# Patient Record
Sex: Male | Born: 1950 | Race: White | Hispanic: No | Marital: Married | Smoking: Never smoker
Health system: Southern US, Community
[De-identification: ages and names within clinical notes are randomized; demographics above are authoritative.]

## PROBLEM LIST (undated history)

## (undated) DIAGNOSIS — I77810 Thoracic aortic ectasia: Secondary | ICD-10-CM

## (undated) DIAGNOSIS — Z8669 Personal history of other diseases of the nervous system and sense organs: Secondary | ICD-10-CM

## (undated) DIAGNOSIS — I2510 Atherosclerotic heart disease of native coronary artery without angina pectoris: Secondary | ICD-10-CM

## (undated) DIAGNOSIS — N400 Enlarged prostate without lower urinary tract symptoms: Secondary | ICD-10-CM

## (undated) DIAGNOSIS — Z973 Presence of spectacles and contact lenses: Secondary | ICD-10-CM

## (undated) DIAGNOSIS — R7303 Prediabetes: Secondary | ICD-10-CM

## (undated) DIAGNOSIS — Z972 Presence of dental prosthetic device (complete) (partial): Secondary | ICD-10-CM

## (undated) DIAGNOSIS — I6523 Occlusion and stenosis of bilateral carotid arteries: Secondary | ICD-10-CM

## (undated) DIAGNOSIS — E782 Mixed hyperlipidemia: Secondary | ICD-10-CM

## (undated) DIAGNOSIS — R519 Headache, unspecified: Secondary | ICD-10-CM

## (undated) DIAGNOSIS — M170 Bilateral primary osteoarthritis of knee: Secondary | ICD-10-CM

## (undated) DIAGNOSIS — M7918 Myalgia, other site: Secondary | ICD-10-CM

## (undated) DIAGNOSIS — N210 Calculus in bladder: Secondary | ICD-10-CM

## (undated) DIAGNOSIS — Z8546 Personal history of malignant neoplasm of prostate: Secondary | ICD-10-CM

## (undated) DIAGNOSIS — C801 Malignant (primary) neoplasm, unspecified: Secondary | ICD-10-CM

## (undated) DIAGNOSIS — Z87442 Personal history of urinary calculi: Secondary | ICD-10-CM

## (undated) DIAGNOSIS — R011 Cardiac murmur, unspecified: Secondary | ICD-10-CM

## (undated) DIAGNOSIS — K219 Gastro-esophageal reflux disease without esophagitis: Secondary | ICD-10-CM

## (undated) DIAGNOSIS — I82409 Acute embolism and thrombosis of unspecified deep veins of unspecified lower extremity: Secondary | ICD-10-CM

## (undated) HISTORY — PX: FINGER SURGERY: SHX640

## (undated) HISTORY — DX: Benign prostatic hyperplasia without lower urinary tract symptoms: N40.0

## (undated) HISTORY — PX: OTHER SURGICAL HISTORY: SHX169

## (undated) HISTORY — PX: BLADDER STONE REMOVAL: SHX568

## (undated) HISTORY — PX: VASECTOMY: SHX75

## (undated) HISTORY — PX: CYSTOSCOPY WITH INSERTION OF UROLIFT: SHX6678

## (undated) HISTORY — PX: TONSILLECTOMY: SUR1361

## (undated) HISTORY — PX: TESTICLE REMOVAL: SHX68

## (undated) HISTORY — DX: Bilateral primary osteoarthritis of knee: M17.0

## (undated) HISTORY — PX: ORCHIECTOMY: SHX2116

---

## 1973-12-19 DIAGNOSIS — Z86718 Personal history of other venous thrombosis and embolism: Secondary | ICD-10-CM

## 1973-12-19 HISTORY — PX: OTHER SURGICAL HISTORY: SHX169

## 1973-12-19 HISTORY — PX: KNEE ARTHROSCOPY: SUR90

## 1973-12-19 HISTORY — DX: Personal history of other venous thrombosis and embolism: Z86.718

## 2008-02-25 ENCOUNTER — Encounter (INDEPENDENT_AMBULATORY_CARE_PROVIDER_SITE_OTHER): Admitting: Neurology

## 2009-04-16 ENCOUNTER — Ambulatory Visit (INDEPENDENT_AMBULATORY_CARE_PROVIDER_SITE_OTHER): Admitting: Neurology

## 2009-04-16 VITALS — BP 128/73 | HR 71 | Temp 97.1°F | Resp 18 | Ht 69.0 in | Wt 147.0 lb

## 2009-04-16 NOTE — Progress Notes (Signed)
 See dictated note.

## 2009-04-25 NOTE — Progress Notes (Signed)
 CLINIC: [facility]    REPORT TYPE: Consultation    Dictating Practitioner: Gordy Levan, M.D.        DATE OF SERVICE: 04/16/2009    REASON FOR VISIT: Initial Eval    Ronald Becker is a lovely 58 year old male referred by Dr. Oliver Becker in the  [facility] for neurologic evaluation.    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 He is easily distracted and has poor impulse control. Ronald Becker  has been with his caregiver since 40. There are chronic problems with  "getting into other people's business". In addition, the patient has  difficulty with attention and focusing. For some time, there has been some  increased activity during the night. The patient awakens, makes noise, and  sometimes talks to either himself or other perceived persons. Most  recently, the patient has had these episodes during the day. His  caregiver describes it as "almost as if there is another person inside  him". There are no short-term memory difficulties. There are no real  functional changes. The patient is quite adept at asking for his  medications and ride in the morning. 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 The patient is able to  feed, dress, and toilet himself. Ronald Becker's clinical picture is complicated  by a seizure disorder for which he takes phenobarbital. He had one seizure  approximately 8 years ago.    There are no headaches or double vision. His caregiver, Ronald Becker, wonders if  there may be a visual agnosia. There are no changes in hearing or  tinnitus. There is no dysphagia. There are no bowel or bladder problems.  The patient denies any depression. He does have some anxiety and fatigue.  Review of systems is otherwise negative.     Past Medical History: There is no diabetes, hypertension, or cardiac  disease. There is the previously mentioned seizure disorder.  There is a  right rotator cuff injury. There are no allergies to medications. The  patient does not smoke cigarettes or drink alcohol. His surgeries include  right shoulder surgery only. Current medications include phenobarbital 60  mg tid.     Social History: The patient was educated at [employer]. He went to special education. He lived at [facility] for 12 years. Currently, he is with Marigene Ehlers in a level II  home.     Family History: The patient's father had Alzheimer disease.     Physical Examination: Ronald Becker is a well-nourished male in no apparent  distress. Weight is 147 lbs. Blood pressure is 128/73 and pulse is 71.  Respirations are normal at 18.     Mental Status: The patient is alert with fluent speech. I hear no  paraphasic errors. On the mini mental state examination, the patient  scores 22/30, losing points for orientation, short-term verbal recall,  repetition, writing, following a three-stage command, and copying the  intersecting pentagons.     Cranial Nerves: Pupils are equal round and reactive to light.  Extraocular muscles are intact. Visual fields are full to confrontation.  Facial sensation and symmetry are normal. Hearing is intact to rustle  bilaterally. Tongue and uvula are midline.     Motor: There are pronated feet with fallen arches bilaterally.  Hammertoes, right greater than left.  Sensory: Pinprick and vibration appear to be intact.     Reflexes: These are symmetric throughout..     Cerebellar: Finger-nose-finger is performed well. There is some  slowing of finger-finger on the right. Rapid alternating movements are  performed well. There is slowing of toe tap bilaterally.     Gait: The patient is unable to walk on his toes. He has difficulty  walking on his heels. He is otherwise steady.     Impression:     1. Developmentally disabled.   2. Moderate mental retardation.   3. Seizure disorder, on phenobarbital.   4. Right rotator cuff injury.   5.  Anxiety.   6. Fatigue.   7. Spells of unclear etiology.  Ronald Becker's caregiver, Ronald Becker, appears to be most concerned about the spells  that are happening at night and now during the day. She does not feel that  the patient has declined from a previous higher level of functioning. She  does not think that there are short-term memory difficulties. I therefore  suggested that we start with an MRI of the brain and also an EEG in light  of the patient's history of seizure disorder. Ronald Becker will return to clinic  in 2 months for reevaluation.          Ronald Glazier, MD, PhD  Professor of Neurosciences    cc:          Electronically signed by:  Gordy Levan, M.D. 04/25/2009 07:06 P          DD: 04/16/2009 DT: 04/24/2009 07:42 A DocNo.: [id number]  JPC/sus    Referring Physician:  Oliver Becker M.D.  [address]  [place] [address]    Primary Care Physician:  Ronald Becker M.D.  [address]  [facility]  [place], [place] [address]    cc::

## 2009-04-29 ENCOUNTER — Ambulatory Visit (HOSPITAL_BASED_OUTPATIENT_CLINIC_OR_DEPARTMENT_OTHER)

## 2009-07-02 ENCOUNTER — Ambulatory Visit (INDEPENDENT_AMBULATORY_CARE_PROVIDER_SITE_OTHER): Admitting: Neurology

## 2009-07-02 VITALS — BP 110/74 | HR 64 | Temp 98.0°F

## 2009-07-02 MED ORDER — PHENOBARBITAL PO: 5.00 mg | ORAL | Status: AC

## 2009-07-06 NOTE — Progress Notes (Signed)
 See dictated note.

## 2009-07-10 NOTE — Progress Notes (Signed)
CLINIC: [facility]    REPORT TYPE: Consultation    Dictating Practitioner: Gordy Levan, M.D.        DATE OF SERVICE: 07/02/2009    REASON FOR VISIT: Follow up    Ronald Becker is a lovely 58 year old male with developmental delay and moderate  mental retardation who returns for follow up. The patient's clinical  picture is complicated by a seizure disorder for which he takes  phenobarbital. At first visit, on April 16, 2009, the patient's caregiver,  Ronald Becker, felt that Ronald Becker did not really have any short-term memory  difficulties. We did suggest an MRI of the brain and also an EEG in light  of the patient's history of seizure disorder. Done on 04/29/2009, the  patient's MRI of the brain showed multiple scattered foci of T2  prolongation within the frontal and parietal white matter, nonspecific,  possibly associated with chronic small vessel ischemic disease. The  patient's EEG of the brain done on 04/29/2009 was normal. There were no  focal or epileptiform abnormalities observed. Overall, Ronald Becker's mood has  improved significantly. He is bowling during the week. Functionally, he  is able to bathe, dress, and toilet without difficulty. He is able to feed  himself. There are no complaints of headaches, double vision, changes in  hearing or tinnitus, or dysphagia. There are no bowel or bladder problems.  Review of systems is otherwise negative.     Past medical history, social history, and family history are  otherwise unchanged.     On physical examination, the patient is a well-developed,  well-nourished male in no apparent distress. Vital signs are stable. The  patient appears well. On the mini mental state examination, the patient  scores 17/30 which is actually down 5 points from his last visit. Cranial  nerves are normal. There are pronated feet with fallen arches bilaterally.  There are also hammertoes, right greater than left. Reflexes are  symmetric. The patient is not able to walk on his toes. He has  difficulty  walking on his heels. Gait is otherwise stable.     Impression:     1. Developmentally disabled.   2. Moderate mental retardation.   3. Seizure disorder, on phenobarbital, normal recent EEG.   4. Right rotator cuff injury.   5. Anxiety.   6. Fatigue.     I suggested to Ronald Becker's caregiver, Ronald Becker, that she continue to  observe Ronald Becker for evidence of a decline in his functioning. She denies  any short-term memory problems. She will, however, ask at the patient's  day program with regards to this. Ronald Becker will return to clinic in 4 months  for reevaluation.          Milus Glazier, MD, PhD  Professor of Neurosciences      cc:            Electronically signed by:  Gordy Levan, M.D. 07/10/2009 08:54 A          DD: 07/02/2009 DT: 07/09/2009 07:42 A DocNo.: [id number]  JPC/sus    Referring Physician:  Oliver Barre M.D.  [address]  [place] [address]    Primary Care Physician:  Oliver Barre M.D.  [address]  [facility]  [place], [place] [address]    cc::

## 2009-11-05 ENCOUNTER — Encounter (INDEPENDENT_AMBULATORY_CARE_PROVIDER_SITE_OTHER): Payer: Self-pay | Admitting: Neurology

## 2009-11-05 ENCOUNTER — Encounter (INDEPENDENT_AMBULATORY_CARE_PROVIDER_SITE_OTHER): Admitting: Neurology

## 2010-09-29 NOTE — Procedures (Signed)
 REPORT TYPE: Outpatient Standard EEG    EEG# [id number]    [facility] NAME: Ronald Becker    Dictating Physician: Rosie Fate. Otto Herb, M.D.     Staff Physician: Reyes Ivan. Marnette Burgess, M.D.    Requesting Provider: Gordy Levan, M.D.    Study Date: 04/29/2009    START TIME: 0910 STOP TIME: 1045 EQUIPMENT TYPE: XLTEK    Indication for Referral: Seizure disorder    Clinical History/Diagnosis: Moderate cognitive difficulty, seizures    Medications: Ativan, phenobarbital    Sedation: NA Medication: NA Dosage: NA    Time Since Last Seizure: 10 years ago    Amount of Sleep in Last 24 Hours: 8 hours    Last Meal: 0630    COMMENT: This standard EEG is performed on a 59 year old man with  cognitive complaints, seizure disorder and episodes of altered mental  status each night. He is currently receiving phenobarbital and Ativan.    REPORT: During quiet wakefulness 8-9 Hz potentials are observed with  generalized and symmetric distribution, which are maximal in amplitude in  the posterior head regions. They are attenuated by eye opening. Medium  amplitude beta activity is observed in the anterior and central head  regions. Drowsiness is associated with attenuation of the posterior  dominant rhythm, the appearance of central theta potentials and slow,  rolling eye movements. Stage II of sleep is characterized by symmetric  vertex waves, 14-16 Hz sleep spindles, and K-complexes that occur on a  background of generalized, symmetrically distributed delta and theta  potentials. Deeper stages of sleep are not achieved. Hyperventilation  elicits no significant changes in the record. Photic stimulation elicits a  modest, symmetric driving response at all flash frequencies.    INTERPRETATION: This EEG, performed during wakefulness and stages I and II  of sleep, is within normal limits. No focal or epileptiform abnormalities  are observed.    Ronald Becker, M.D., Neurology Resident    I have examined the above tracing and have reviewed and edited  the above  report. I concur with the findings as stated above.    Cristie Hem, M.D., Ph.D./Professor of Neurosciences            Reviewed & Electronically Signed by:  Rosie Fate. Otto Herb, M.D. 05/04/2009 03:15 P    Electronically signed by:  Reyes Ivan. Marnette Burgess, M.D. 05/25/2009 12:18 P    DD: 04/29/2009 DT: 04/30/2009 10:24 A DocNo.: [id number]  RMM/wmg      cc:

## 2010-12-19 HISTORY — PX: CYSTOSCOPY WITH INSERTION OF UROLIFT: SHX6678

## 2011-12-20 DIAGNOSIS — C61 Malignant neoplasm of prostate: Secondary | ICD-10-CM

## 2011-12-20 HISTORY — DX: Malignant neoplasm of prostate: C61

## 2014-10-23 ENCOUNTER — Emergency Department
Admission: EM | Admit: 2014-10-23 | Discharge: 2014-10-24 | Disposition: A | Discharge status: Home / Self Care | Payer: Medicare Other | Attending: Emergency Medicine | Admitting: Emergency Medicine

## 2014-10-23 DIAGNOSIS — M25512 Pain in left shoulder: Secondary | ICD-10-CM

## 2014-10-23 DIAGNOSIS — S43025A Posterior dislocation of left humerus, initial encounter: Principal | ICD-10-CM | POA: Insufficient documentation

## 2014-10-23 DIAGNOSIS — F88 Other disorders of psychological development: Secondary | ICD-10-CM | POA: Insufficient documentation

## 2014-10-23 DIAGNOSIS — S43026D Posterior dislocation of unspecified humerus, subsequent encounter: Secondary | ICD-10-CM

## 2014-10-23 DIAGNOSIS — R569 Unspecified convulsions: Secondary | ICD-10-CM | POA: Insufficient documentation

## 2014-10-23 DIAGNOSIS — Y929 Unspecified place or not applicable: Secondary | ICD-10-CM | POA: Insufficient documentation

## 2014-10-23 DIAGNOSIS — Z87820 Personal history of traumatic brain injury: Secondary | ICD-10-CM | POA: Insufficient documentation

## 2014-10-23 DIAGNOSIS — S43022A Posterior subluxation of left humerus, initial encounter: Secondary | ICD-10-CM

## 2014-10-23 DIAGNOSIS — W010XXA Fall on same level from slipping, tripping and stumbling without subsequent striking against object, initial encounter: Secondary | ICD-10-CM | POA: Insufficient documentation

## 2014-10-23 MED ORDER — METHOHEXITAL SODIUM 0.5 GM IJ SOLR
1.5000 mg/kg | INTRAMUSCULAR | Status: AC
Start: 2014-10-23 — End: 2014-10-23
  Filled 2014-10-23: qty 500

## 2014-10-23 MED ORDER — METHOHEXITAL SODIUM 0.5 GM IJ SOLR
INTRAMUSCULAR | Status: AC | PRN
Start: 2014-10-23 — End: 2014-10-23
  Administered 2014-10-23: 70 mg via INTRAVENOUS

## 2014-10-23 MED ORDER — ACETAMINOPHEN 325 MG PO TABS
975.0000 mg | ORAL_TABLET | Freq: Once | ORAL | Status: AC
Start: 2014-10-23 — End: 2014-10-23
  Administered 2014-10-23: 975 mg via ORAL
  Filled 2014-10-23: qty 3

## 2014-10-23 NOTE — ED Procedure Note (Signed)
Procedure Note  Orthopedic Injury Treatment  Date/Time: 10/23/2014 10:54 PM  Performed by: Laney PastorGRIEME, Danielly Ackerley ARTHUR  Authorized by: Kathlynn GrateBARBER, CAROLYN RAGUET  Consent: Verbal consent obtained. Written consent obtained.  Risks and benefits: risks, benefits and alternatives were discussed  Consent given by: power of attorney  Injury location: shoulder  Location details: left shoulder  Injury type: dislocation  Dislocation type: posterior  Hill-Sachs deformity: yes  Chronicity: new  Pre-procedure neurovascular assessment: neurovascularly intact  Pre-procedure distal perfusion: normal  Pre-procedure neurological function: normal  Pre-procedure range of motion: reduced  Manipulation performed: yes  Reduction method: traction and counter traction  Immobilization: splint  Post-procedure neurovascular assessment: post-procedure neurovascularly intact  Post-procedure distal perfusion: normal  Post-procedure neurological function: normal  Post-procedure range of motion: normal  Patient tolerance: Patient tolerated the procedure well with no immediate complications

## 2014-10-23 NOTE — Consults (Signed)
ORTHOPAEDIC SURGERY CONSULTATION    10/23/2014   9:17 PM  Requesting physician: Romona CurlsBarber, Carolyn Raguet, *    History:  ID:  Ronald Becker   63 year old male  [id number]    CC: left shoulder pain    DOI: 10/17/14    HPI:   63 year old male who sustained a left shoulder injury last Friday. Patient is developmentally delayed so difficulty with answering questions and unsure of his past medical history. Initially [facility] where he was told he had a shoulder [place] and was placed in a sling. Pain didn't improve so his caregiver took him to [facility] ED today.    Mechanism of injury: stepped off a curb he wasn't expecting, tripped, and fell directly on left shoulder.    Immediate symptoms: immediate pain, immediate swelling. Symptoms have been unchanged since that time.     Currently denies numbness/tingling or weakness, F/C/N/V or concurrent injuries.    Baseline activity level is RHD. No antecedent pain. Denies prior dislocations.    Lives at home with his caregiver. Additional history obtained from caregiver and sister.    PMH:   Developmental delay  Seizures    Meds:   Phenobarbital  Dilantin    PSH:   Unsure    All: Review of patient's allergies indicates no known allergies.    FH: noncontributory    SH:    Denies smoking tobacco, denies drinking, denies illicit drug use  Lives at home with his caregiver.  Prior level of function: RHD, no antecedent left shoulder pain.    ROS:   Unremarkable.   Denies F/C/N/V/D/CP/SOB.    Physical Exam:     Temperature:  [97.9 F (36.6 C)] 97.9 F (36.6 C) (11/05 1638)  Blood pressure (BP): (137)/(85) 137/85 mmHg (11/05 1638)  Heart Rate:  [82] 82 (11/05 1638)  Respirations:  [16] 16 (11/05 1638)  Pain Score:  [-] 9 (11/05 2000)  O2 Device:  [-]   SpO2:  [95 %] 95 % (11/05 1638)  Wt Readings from Last 1 Encounters:   10/23/14 68.04 kg (150 lb)       General:   A&A, no distress  NLB  RRR    Musculoskeletal:  LUE:     No lacerations, no ecchymosis, no edema   +TTP  shoulder, no TTP arm, elbow, forearm, wrist, hand     + pain with ROM of shoulder, no pain with ROM elbow, wrist, hand     Palpable radial pulse, cap refill < 2 seconds, WWP     SILT R/M/U/Ax nerve distributions   Ain/pin/ulnar intact   4/5 biceps/triceps/we/wf/fg/io    Radiographs:  Left shoulder: posterior dislocation with reverse Hill-Sachs lesion    Assessment/Plan:  63 year old male s/p mechanical fall found to have left posterior shoulder dislocation. Mechanism not usually consistent with posterior dislocation, possible prior seizure given patient history and poor source of information given developmental delay though he insists he did not seize recently.    1. Closed reduction under sedation performed with adequate reduction on post-reduction XR. Patient noted to have instability with re-dislocation on 30 degrees of internal rotation with catching on reverse Hill-Sachs lesion.  2. Ultra sling immobilization with no active adduction or internal rotation.  3. F/U ortho hand clinic next Wednesday with Dr. Sabino SnipesMeunier    The above plan was discussed with chief on call, Dr. Nanda QuintonMisaghi

## 2014-10-23 NOTE — ED Notes (Signed)
Pt medicated with Brevital 70mg  SIVP. Pt's eyes was asleep when the injection was completed.  Dr. Adelfa KohGrieme then began to manually reduce the pt's rt shoulder. Pt shoulder was reduced after 1 maneuver. Shoulder immobilizer was replaced.  Will continue 1:1 monitoring until pt returns to his baseline.

## 2014-10-23 NOTE — ED Provider Notes (Signed)
Pt seen with dr Adelfa KohGrieme.  Pt is a 63yom sp mechanical fall 1 week ago without LOC with co L shoulder pain. [facility] thought dislocated but then not and placed in sling. Pt presents with persistent L shoulder pain. Denies CHI/neck /back/cp or other complaints. -f/n/vda.    No past medical history on file.   CVA, sz  No current facility-administered medications on file prior to encounter.     Current Outpatient Prescriptions on File Prior to Encounter   Medication Sig Dispense Refill    PHENOBARBITAL PO 5 mg. 1 tablet three times daily     No Known Allergies  Sh no smk/eoth/ivd  Fh neg  ROS:  Denies HA, weakness, fatigue, dizziness, lightheadedness  Denies sore throat, rhinorrhea, difficulty swallowing, otalgia  Denies fevers, night sweats, chills  Denies neck pain, neck stiffness, photophobia  Denies SOB, wheezing, cough, DOE, orthopnea, PND  Denies palpitations, chest pain,  Denies abdominal pain, vomiting, diarrhea,   Denies dysuria, hematuria, scrotal pain/swelling or vag d/c  Denies rash, LE swelling, pruritis + L shoulder pain  Denies focal weakness, numbness, tingling.   Denies difficulty with vision, swallowing.  Denies difficulty with ambulation or balance.    4    10/23/14  1638   BP: 137/85   Pulse: 82   Temp: 97.9 F (36.6 C)   Resp: 16   SpO2: 95%     PE:  Pt well appearing in NAD, resting comfortably  HEENT NCAT PERRLA EOMI O/P: clear, no redness  Neck: supple, NT, no adenopathy, no stiffness, -thyromegaly noted  Lungs: CTA B -tachypnea/ wheeze/ rales noted  CV: RRR nls1s2 -MRG  Abd; soft NT ND nl BS -HSM -mass -guarding/rebound -flank tenderness  Extr: -C/C/E -rash L shoulder +tenderness L humeral head, min swell, no redness, nvi nl axillary sens no elbow/wrist/hand tenderness. 2=raidal pulse  Back: -TLS tenderness -flank pain  Neuro: A&Ox3 CNS 2-12 intact Motor: 5/5 B delt/bic/tric/infrasp/wr ext/flex/f/ext/io  5/5 B ilipsoas/quads/hamstr/h.adduc/h.abduc/gastr/tib  ant/dorsif/plantarflesion  Sens intact throughout DTRS: 2+thorughout bic/tric/brach/patell/ankles nl gait  IMP: pt is a 63yo sp mechanical fall 1 week ago without head trauma with L posterior shoulder dislocation. Have consulted ortho and plan cons sedation/reduction        Kathlynn GrateBarber, Refoel Palladino Raguet, MD  10/23/14 2052

## 2014-10-23 NOTE — ED Notes (Signed)
Assumed pt care, cc left shoulder pain 8/10 comfort measures provided. Pt care giver at bedside. Pt reports mechanical fall on Friday c injury to left shoulder, pt went to [facility] where dx c shoulder sprain, pt caregiver reports inability to see PCP for FU as instructed so came here for eval. Pt awake/alert, speaking in full/clear sentences, a/ox4, maex4 = strength, +cms, PERRLA. No extra heart sounds, breath sounds clear, rr even/non labored. Bowel sounds present, abd soft/non tender. Skin intact. amb c steady gait. Pt denies cp, sob, abd pain, n/v/d/c, f/c. Bed low and locked, side rails up x2. Safety and comfort measures provided.

## 2014-10-23 NOTE — ED MD Progress Note (Signed)
Sign-out received from Dr. 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 Fall one week ago, seen at [facility], placed in shoulder immobilizer, here has posterior shoulder dislocation. Neurovascularly intact, pain with movement. Attempted sedation reduction, appears to be out again on follow-up Xray. Orthopedics has evaluated, plan to discuss with them.

## 2014-10-23 NOTE — ED MD Progress Note (Signed)
Conscious sedation performed under my direct supervision with brevital. Shoulder easily reduced by dr Lenoard AdenGreime with  Inline traction. Post reduction xray pend. nvi pre and post.

## 2014-10-23 NOTE — ED MD Progress Note (Signed)
Post shoulder dislocated, if successful can likely go.  Ortho seegin aslo

## 2014-10-23 NOTE — ED Notes (Signed)
Pt has returned to his pre sedation physiological status. 1:1 monitoring ended.

## 2014-10-23 NOTE — ED Procedure Note (Signed)
Procedure Note  Sedation  Date/Time: 10/23/2014 10:56 PM  Performed by: Laney PastorGRIEME, Aerielle Stoklosa ARTHUR  Authorized by: Kathlynn GrateBARBER, CAROLYN RAGUET  Consent: Verbal consent obtained. Written consent obtained.  Risks and benefits: risks, benefits and alternatives were discussed  Consent given by: power of attorney  Patient identity confirmed: verbally with patient  Patient sedated: yes  Sedation type: moderate (conscious) sedation  Sedatives: methohexital  Sedation end date/time: 10/23/2014 10:56 PM  Vitals: Vital signs were monitored during sedation.  Patient tolerance: Patient tolerated the procedure well with no immediate complications

## 2014-10-23 NOTE — ED Notes (Signed)
Bed: T02  Expected date:   Expected time:   Means of arrival:   Comments:  For triage

## 2014-10-23 NOTE — ED Notes (Signed)
Drs. Benna DunksBarber and Adelfa KohGrieme to the bedside to manually reduce pt's rt shoulder.  Pt calm, skins warm, dry and intact  Sinus rhythm on the monitor. Crash Cart at the bedside, intubation box opened, ET tube open in case there are any airway compromise.

## 2014-10-23 NOTE — ED Notes (Signed)
PRN note  2100: pt prepped for conscious sedation for left shoulder relocation  Pt on monitor w/ ausible alarms on  Pt pt on ETCO2=33  Airway box, crash cart, o2, suction, ambu bag at bedside  Dr Paediatric nursebarber, dr Adelfa Kohgrieme and ortho MD(Parvaresh) present for sedation

## 2014-10-24 MED ORDER — METHOHEXITAL SODIUM 0.5 GM IJ SOLR
INTRAMUSCULAR | Status: DC | PRN
Start: 2014-10-24 — End: 2014-10-24
  Administered 2014-10-24 (×2): 20 mg via INTRAVENOUS

## 2014-10-24 MED ORDER — METHOHEXITAL SODIUM 0.5 GM IJ SOLR
1.0000 mg/kg | Freq: Once | INTRAMUSCULAR | Status: AC
Start: 2014-10-24 — End: 2014-10-24
  Administered 2014-10-24: 70 mg via INTRAVENOUS
  Filled 2014-10-24: qty 500

## 2014-10-24 MED ORDER — HYDROCODONE-ACETAMINOPHEN 5-325 MG OR TABS
1.0000 | ORAL_TABLET | Freq: Four times a day (QID) | ORAL | Status: AC | PRN
Start: 2014-10-24 — End: ?

## 2014-10-24 NOTE — Discharge Instructions (Signed)
Take the Norco as prescribed for severe shoulder pain. Follow up with the Orthopedic Trauma Clinic within 1-2 weeks for a follow-up. Stay in the specialized sling at all times to prevent further dislocation. If your symptoms worsen, you develop new symptoms such as fever, or if you have any other concerns please return promptly to the Emergency Department.       Dislocation, Shoulder    You have been seen for a dislocation of your shoulder.    The dislocation has been reduced (repaired). The bones are now in their normal position.    A dislocation is when one of the 2 bones that make up a joint gets out of place. It is no longer in the right place to let the joint bend. Sometimes a ligament or set of ligaments is injured during the dislocation. Shoulder dislocations can also injure one of the nerves which give the arm and hand sensation (feeling). There may also be a fracture (broken bone) with a dislocation.    After the bones are relocated (moved), general care involves medicine to reduce pain. It also includes a splint/cast to reduce movement. It also includes Rest, Ice, Compression and Elevation of the injured area. Remember this as "RICE."   REST: Limit the use of the injured body part.   ICE: By applying ice to the affected area, swelling and pain can be reduced. Place some ice cubes in a re-sealable (Ziploc) bag and add some water. Put a thin washcloth between the bag and the skin. Apply the ice bag to the area for at least 20 minutes. Do this at least 4 times per day. It is okay to do this more often than directed. You can also do it for longer than directed. NEVER APPLY ICE DIRECTLY TO THE SKIN.   COMPRESS: Compression means to apply pressure around the injured area such as with a splint, cast or an ACE bandage. Compression decreases swelling and improves comfort. Compression should be tight enough to relieve swelling but not so tight as to decrease circulation. Increasing pain, numbness, tingling,  or change in skin color, are all signs of decreased circulation.   ELEVATE: Elevate the injured part. For example, an arm can be elevated with a sling while standing and sitting. It can be elevated by propping it up on pillows while lying down.    You have been given a sling and swath for your injury. This will help keep the injured part elevated. It will also improve comfort.    YOU SHOULD SEEK MEDICAL ATTENTION IMMEDIATELY, EITHER HERE OR AT THE NEAREST EMERGENCY DEPARTMENT, IF ANY OF THE FOLLOWING OCCURS:   The affected area gets swollen or much more painful.   New numbness and tingling in or below the affected area.   You develop a cold hand that seems to have blood supply problems.

## 2014-10-24 NOTE — ED Notes (Signed)
Portable digital xrays of the rt shoulder completed.

## 2014-10-24 NOTE — Interdisciplinary (Signed)
Please read the narrative in EPIC.

## 2014-10-24 NOTE — Interdisciplinary (Signed)
Dr. Sabino GasserPaveresh fit patient with ultrasling for dislocated shoulder in ER.

## 2014-10-24 NOTE — ED Notes (Signed)
Shoulder immobilizer, from the Ortho Clinic, applied to the right shoulder by the Ortho Resident.

## 2014-10-24 NOTE — ED Notes (Signed)
[facility] cell phone [phone]

## 2014-10-24 NOTE — ED Notes (Signed)
Dr. Quitman LivingsKurz and the Orthopedic Resident at the bedside  0404: Dr. Quitman LivingsKurz administered Brevital 70mg  SIVP over 1 minute.  Atticus.[id number] Orthopedic Resident attempting to manually reduce pt's right shoulder.

## 2014-10-24 NOTE — ED Notes (Signed)
Pt resting on his gurney with a shoulder immobilizer in place. Awaiting Ortho re evaluation.

## 2014-10-24 NOTE — ED Notes (Signed)
Brevital 20mg  SIVP.

## 2014-10-24 NOTE — ED Notes (Signed)
Pt's Caregiver here to take pt home. Caregiver verbalized understanding of all instructions. Ambulated with a steady gait from the tx area.

## 2014-10-24 NOTE — ED MD Progress Note (Signed)
Sign out note  63 yo w post shoulder dislocation.  S/p reduciton attempt x 1 failed  Ortho to see and attempt reduction

## 2014-10-28 NOTE — ED Follow-up Note (Signed)
Follow-up type: Callback       Routine ED Patient Call Back    Patient unable to be contacted, message left

## 2014-10-30 ENCOUNTER — Ambulatory Visit: Payer: Medicare Other | Attending: Emergency Medicine | Admitting: Hand Surgery

## 2014-10-30 ENCOUNTER — Ambulatory Visit (HOSPITAL_BASED_OUTPATIENT_CLINIC_OR_DEPARTMENT_OTHER)
Admission: RE | Admit: 2014-10-30 | Discharge: 2014-10-30 | Disposition: A | Discharge status: Home / Self Care | Payer: Medicare Other

## 2014-10-30 VITALS — BP 118/78 | HR 70 | Temp 98.1°F

## 2014-10-30 DIAGNOSIS — M25512 Pain in left shoulder: Principal | ICD-10-CM | POA: Insufficient documentation

## 2014-10-30 DIAGNOSIS — S43005A Unspecified dislocation of left shoulder joint, initial encounter: Secondary | ICD-10-CM | POA: Insufficient documentation

## 2014-10-30 MED ORDER — LOVASTATIN PO: ORAL | Status: AC

## 2014-10-30 NOTE — Progress Notes (Signed)
Department of Orthopaedic Surgery  Division of Hand and Microvascular Surgery    HISTORY OF PRESENT ILLNESS  Ronald EatonDaniel Breene Becker is a 63 year old M with medical history of developmental delay requiring long term care and previous seizure activity who presents for evaluation of a recent L shoulder posterior dislocation.  He has a history of fall and RIGHT shoulder dislocation complicated by RTC tear s/p RTC repair.  On 10/30 the pt fell and sustained a posterior shoulder dislocation that was reduced in the ED.  It was noted to be somewhat unstable and a reverse hill-sachs was noted on plain films.  He was placed in a shoulder immobilizer and follows up today.  Of note he and his caretaker do not recall any previous dislocation or recent grand-mal type seizure activity.      PAST MEDICAL HISTORY  Moderate mental retardation  Stroke  seizure    PAST SURGICAL HISTORY  RTC repair R shoulder     MEDICATIONS    HYDROcodone-acetaminophen (NORCO) 5-325 MG tablet Take 1 tablet by mouth every 6 hours as needed for Severe Pain (Pain Score 7-10).   LOVASTATIN PO    PHENOBARBITAL PO 5 mg. 1 tablet three times daily       ALLERGIES  No Known Allergies    SOCIAL HISTORY  Lives in a sheltered program, here today with his long term care taker.  No etoh, cigs, illicits.     FAMILY HISTORY  No family history on file.     REVIEW OF SYSTEMS  A comprehensive review of systems questionnaire completed by the patient is negative except for cataracts.    PHYSICAL EXAMINATION  VITALS: BP 118/78 mmHg   Pulse 70   Temp(Src) 98.1 F (36.7 C) (Oral), There is no weight on file to calculate BMI., Data Unavailable/10  GENERAL: A+Ox3.  Well appearing and well groomed.   MENTAL STATUS: Pleasant and cooperative.    UPPER EXTREMITY:   R shoulder:  Sling in place  Passive gentle ROM performed: ER to 10, IR to belly and FF/Abd to ~20 without significant pain or mechanical symptoms  Fires AIN/PIN/U/A  SILT R/U/M/A  2+ radial pulse    IMAGING  STUDIES  Right shoulder:   No definite reverse osseous Bankart lesion or large reverse Hill-Sachs deformity, but there is slight flattening along the anteromedial   aspect of the humeral head.  There is mild osteoarthrosis of the acromioclavicular joint.  Bones appear demineralized.    IMPRESSION AND PLAN  Ronald Becker is a 63 year old who presents for follow up after a recent left posterior GH dislocation with a reverse hill-sachs.  Per report this is the first such dislocation he has sustained on the L.  He does have a history of seizure activity with possible posterior laxity as a posterior dislocation is not common.  Given his mental capacities and inability to maintain shoulder precautions will continue sling wear at this time with f/u in 2 weeks for repeat imaging and exam with expected weaning of his sling at that time.  Plan and findings d/w Dr. Jackalyn LombardHentzen

## 2014-10-30 NOTE — Progress Notes (Signed)
See resident’s note for details.  I saw and evaluated the patient.  I have discussed the patient with the resident and agree with resident’s findings and the plan we developed as written.

## 2014-11-06 ENCOUNTER — Telehealth (HOSPITAL_BASED_OUTPATIENT_CLINIC_OR_DEPARTMENT_OTHER): Payer: Self-pay | Admitting: Hand Surgery

## 2014-11-06 NOTE — Telephone Encounter (Signed)
Patient caregiver walks in today requesting a possible letter stating he can go back to his program.  She will call back today to see if letter has been done. Message was sent to Dr. Jackalyn LombardHentzen.

## 2014-11-06 NOTE — Telephone Encounter (Signed)
Please find out what kind of program they are referring to and what type of activity that would entail.

## 2014-11-07 ENCOUNTER — Telehealth (HOSPITAL_BASED_OUTPATIENT_CLINIC_OR_DEPARTMENT_OTHER): Payer: Self-pay | Admitting: Hand Surgery

## 2014-11-07 NOTE — Telephone Encounter (Signed)
Pt's care giver requesting a letter for pt to go back to work and continue with program shelter work shop. He will like to start at soon as possible, to please mail letter to his home address.

## 2014-11-07 NOTE — Telephone Encounter (Signed)
Pt is returning Nizta's call re: additional information for pt's return to work letter.    Krystal Eatonaniel Breene Silvio  requested a letter of disability status on 11/07/2014.    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

## 2014-11-07 NOTE — Telephone Encounter (Signed)
Called patient caregiver for further clarification for letter. Should be calling back with type of activities.

## 2014-11-07 NOTE — Telephone Encounter (Signed)
Letter for program done and faxed as requested.

## 2014-11-18 ENCOUNTER — Encounter (HOSPITAL_BASED_OUTPATIENT_CLINIC_OR_DEPARTMENT_OTHER): Payer: Self-pay | Admitting: Hand Surgery

## 2014-11-18 DIAGNOSIS — M25512 Pain in left shoulder: Principal | ICD-10-CM

## 2014-11-20 ENCOUNTER — Ambulatory Visit
Admission: RE | Admit: 2014-11-20 | Discharge: 2014-11-20 | Disposition: A | Discharge status: Home / Self Care | Payer: Medicare Other | Source: Ambulatory Visit | Attending: Diagnostic Radiology | Admitting: Diagnostic Radiology

## 2014-11-20 ENCOUNTER — Encounter (HOSPITAL_BASED_OUTPATIENT_CLINIC_OR_DEPARTMENT_OTHER): Payer: Self-pay | Admitting: Hand Surgery

## 2014-11-20 ENCOUNTER — Ambulatory Visit: Payer: Medicare Other | Attending: Hand Surgery | Admitting: Hand Surgery

## 2014-11-20 VITALS — BP 146/90 | HR 65 | Temp 98.9°F | Ht 68.0 in | Wt 150.0 lb

## 2014-11-20 DIAGNOSIS — M19012 Primary osteoarthritis, left shoulder: Principal | ICD-10-CM | POA: Insufficient documentation

## 2014-11-20 DIAGNOSIS — S43005D Unspecified dislocation of left shoulder joint, subsequent encounter: Principal | ICD-10-CM

## 2014-11-20 NOTE — Progress Notes (Signed)
Department of Orthopaedic Surgery  Division of Hand and Microvascular Surgery    HISTORY OF PRESENT ILLNESS  From his last visit on 10/30/14:  "Ronald Becker is a 63 year old M with medical history of developmental delay requiring long term care and previous seizure activity who presents for evaluation of a recent L shoulder posterior dislocation.  He has a history of fall and RIGHT shoulder dislocation complicated by RTC tear s/p RTC repair.  On 10/30 the pt fell and sustained a posterior shoulder dislocation that was reduced in the ED.  It was noted to be somewhat unstable and a reverse hill-sachs was noted on plain films.  He was placed in a shoulder immobilizer and follows up today.  Of note he and his caretaker do not recall any previous dislocation or recent grand-mal type seizure activity."    Since his last visit he has remained in his brace aside from bathing but per the report of his caregiver has also been using the LUE in the brace.  No complaints today, no pain. Denies numbness/tingling.      PAST MEDICAL HISTORY  Moderate mental retardation  Stroke  seizure    PAST SURGICAL HISTORY  RTC repair R shoulder     MEDICATIONS    HYDROcodone-acetaminophen (NORCO) 5-325 MG tablet Take 1 tablet by mouth every 6 hours as needed for Severe Pain (Pain Score 7-10).   LOVASTATIN PO    PHENOBARBITAL PO 5 mg. 1 tablet three times daily       ALLERGIES  No Known Allergies    SOCIAL HISTORY  Lives in a sheltered program, here today with his long term care taker.  No etoh, cigs, illicits.     FAMILY HISTORY  No family history on file.     REVIEW OF SYSTEMS  A comprehensive review of systems questionnaire completed by the patient is negative except for cataracts.    PHYSICAL EXAMINATION  VITALS: BP 146/90 mmHg   Pulse 65   Temp(Src) 98.9 F (37.2 C) (Oral)   Ht 5\' 8"  (1.727 m)   Wt 68.04 kg (150 lb)   BMI 22.81 kg/m2, Body mass index is 22.81 kg/(m^2)., Data Unavailable/10  GENERAL: A+Ox3.  Well appearing and  well groomed.   MENTAL STATUS: Pleasant and cooperative.    UPPER EXTREMITY:   R shoulder:  Sling in place  Passive gentle ROM performed: ER to 20, IR to belly and FF/Abd to ~90* without significant pain or mechanical symptoms  Fires AIN/PIN/U/A  SILT R/U/M/A  2+ radial pulse    IMAGING STUDIES  Right shoulder:   Congruent GH joint.    No definite reverse osseous Bankart lesion or large reverse Hill-Sachs deformity, but there is slight flattening along the anteromedial   aspect of the humeral head.    IMPRESSION AND PLAN  Ronald EatonDaniel Breene Becker is a 63 year old who presents for follow up 1 month after a recent left posterior GH dislocation with a reverse hill-sachs on imaging, doing well.  Per report this is the first such dislocation he has sustained on the L.  Will discontinue sling use today, pt educated on limiting abduction/IR position.  Should he have further complaints or dislocation events they can return to clinic for evaluation and further treatment options.  Plan and findings d/w Dr. Parks Rangerak.

## 2019-09-17 ENCOUNTER — Encounter: Payer: Self-pay | Admitting: Orthopaedic Surgery

## 2019-09-17 ENCOUNTER — Ambulatory Visit: Payer: Self-pay

## 2019-09-17 ENCOUNTER — Other Ambulatory Visit: Payer: Self-pay

## 2019-09-17 ENCOUNTER — Ambulatory Visit (INDEPENDENT_AMBULATORY_CARE_PROVIDER_SITE_OTHER): Payer: Medicare Other | Admitting: Orthopaedic Surgery

## 2019-09-17 VITALS — BP 136/78 | HR 40 | Ht 70.0 in | Wt 200.0 lb

## 2019-09-17 DIAGNOSIS — M170 Bilateral primary osteoarthritis of knee: Secondary | ICD-10-CM | POA: Diagnosis not present

## 2019-09-17 DIAGNOSIS — G8929 Other chronic pain: Secondary | ICD-10-CM | POA: Diagnosis not present

## 2019-09-17 DIAGNOSIS — M25561 Pain in right knee: Secondary | ICD-10-CM

## 2019-09-17 DIAGNOSIS — M25562 Pain in left knee: Secondary | ICD-10-CM | POA: Diagnosis not present

## 2019-09-17 MED ORDER — LIDOCAINE HCL 1 % IJ SOLN
2.0000 mL | INTRAMUSCULAR | Status: AC | PRN
  Administered 2019-09-17: 10:00:00 2 mL

## 2019-09-17 MED ORDER — BUPIVACAINE HCL 0.5 % IJ SOLN
2.0000 mL | INTRAMUSCULAR | Status: AC | PRN
  Administered 2019-09-17: 2 mL via INTRA_ARTICULAR

## 2019-09-17 MED ORDER — METHYLPREDNISOLONE ACETATE 40 MG/ML IJ SUSP
80.0000 mg | INTRAMUSCULAR | Status: AC | PRN
  Administered 2019-09-17: 10:00:00 80 mg via INTRA_ARTICULAR

## 2019-09-17 MED ORDER — LIDOCAINE HCL 1 % IJ SOLN
2.0000 mL | INTRAMUSCULAR | Status: AC | PRN
  Administered 2019-09-17: 2 mL

## 2019-09-17 MED ORDER — BUPIVACAINE HCL 0.5 % IJ SOLN
2.0000 mL | INTRAMUSCULAR | Status: AC | PRN
  Administered 2019-09-17: 10:00:00 2 mL via INTRA_ARTICULAR

## 2019-09-17 MED ORDER — METHYLPREDNISOLONE ACETATE 40 MG/ML IJ SUSP
80.0000 mg | INTRAMUSCULAR | Status: AC | PRN
  Administered 2019-09-17: 80 mg via INTRA_ARTICULAR

## 2019-09-17 NOTE — Addendum Note (Signed)
Addended by: Lendon Collar on: 09/17/2019 10:28 AM   Modules accepted: Orders

## 2019-09-17 NOTE — Progress Notes (Signed)
Office Visit Note   Patient: Tristan Hamilton           Date of Birth: 19-Jan-1951           MRN: [id number] Visit Date: 09/17/2019              Requested by: No referring provider defined for this encounter. PCP: No primary care provider on file.   Assessment & Plan: Visit Diagnoses:  1. Chronic pain of both knees   2. Bilateral primary osteoarthritis of knee     Plan: Advanced bilateral knee osteoarthritis.  I had a long discussion with [name] regarding his knees.  He recently moved from [place] to the [place] area in the summer.  He had been receiving cortisone injections and even Visco supplementation from his orthopedist in [place].  I was concerned about multiple cortisone injections and discussed knee replacement particularly on the left that is more symptomatic.  It is been over 6 months since his last injection so I will inject both of his knees today and obtain an MRI scan of his left knee to determine the extent of his arthritis and whether he might be a candidate for either a unilateral or total knee replacement.  Office visit over 45 minutes 50% of the time in counseling  Follow-Up Instructions: Return After MRI left knee.   Orders:  Orders Placed This Encounter  Procedures  . Large Joint Inj: bilateral knee  . XR KNEE 3 VIEW LEFT  . XR KNEE 3 VIEW RIGHT   No orders of the defined types were placed in this encounter.     Procedures: Large Joint Inj: bilateral knee on 09/17/2019 9:56 AM Indications: diagnostic evaluation Details: 25 G 1.5 in needle, anteromedial approach  Arthrogram: No  Medications (Right): 2 mL lidocaine 1 %; 2 mL bupivacaine 0.5 %; 80 mg methylPREDNISolone acetate 40 MG/ML Medications (Left): 2 mL lidocaine 1 %; 2 mL bupivacaine 0.5 %; 80 mg methylPREDNISolone acetate 40 MG/ML Outcome: tolerated well, no immediate complications Consent was given by the patient. Immediately prior to procedure a time out was called to verify the  correct patient, procedure, equipment, support staff and site/side marked as required. Patient was prepped and draped in the usual sterile fashion.       Clinical Data: No additional findings.   Subjective: Chief Complaint  Patient presents with  . Right Knee - Pain  . Left Knee - Pain  Patient presents today for bilateral knee pain X years. No known injury. His left knee seems to being hurting more than the right. He has pain that moves around in his knees depending on what he is doing. He has swelling in his left knee. He takes Aleve for pain. He gets cortisone injections in his knees every three months, and his last injections were in March. He has tried visco injections in the past, but those did not help.  [name] recently moved to the [place] area from [place].  He has been treated for "years" for arthritis of both of his knees by an orthopedist who had been injecting cortisone and even Visco supplementation.  He has had some mild compromise of his activities and has been hesitant to consider knee replacement because he was not that "uncomfortable".  He has tried over-the-counter medicines.  HPI  Review of Systems  Constitutional: Negative for fatigue.  HENT: Negative for ear pain.   Eyes: Negative for pain.  Respiratory: Negative for shortness of breath.  Cardiovascular: Negative for leg swelling.  Gastrointestinal: Negative for constipation and diarrhea.  Endocrine: Negative for cold intolerance and heat intolerance.  Genitourinary: Negative for difficulty urinating.  Musculoskeletal: Positive for joint swelling.  Skin: Negative for rash.  Allergic/Immunologic: Negative for food allergies.  Neurological: Negative for weakness.  Hematological: Does not bruise/bleed easily.  Psychiatric/Behavioral: Negative for sleep disturbance.     Objective: Vital Signs: BP 136/78   Pulse (!) 40   Ht 5\' 10"  (1.778 m)   Wt 200 lb (90.7 kg)   BMI 28.70 kg/m    Physical Exam Constitutional:      Appearance: He is well-developed.  Eyes:     Pupils: Pupils are equal, round, and reactive to light.  Pulmonary:     Effort: Pulmonary effort is normal.  Skin:    General: Skin is warm and dry.  Neurological:     Mental Status: He is alert and oriented to person, place, and time.  Psychiatric:        Behavior: Behavior normal.     Ortho Exam both knees have increased varus more so on left than the right.  No effusion.  Predominately medial joint pain bilaterally.  Lacked a few degrees to full extension on the left and full extension on the right.  No instability.  Flexed over 110 degrees bilaterally.  No popliteal pain.  No calf pain.  +1 pulses.  Neurologically intact.  Straight leg raise negative.  Painless range of motion both hips  Specialty Comments:  No specialty comments available.  Imaging: Xr Knee 3 View Left  Result Date: 09/17/2019 Films of the left knee were obtained in 3 projections standing.  There is about 6 degrees of varus.  There is bone-on-bone in the medial compartment with peripheral osteophytes and subchondral sclerosis particularly about the tibia.  There are some osteophytes in the lateral compartment and at the patellofemoral joint as well.  I think the patella tracks in the midline.  Films are consistent with moderate osteoarthritis predominately in the medial compartment  Xr Knee 3 View Right  Result Date: 09/17/2019 Films of the right knee were obtained in 3 projections standing.  There is about 4 degrees of varus and considerable narrowing of the medial knee compartment with peripheral osteophytes and subchondral sclerosis.  The changes are less than the left knee but similar in that there are degenerative changes in all 3 compartments.  No ectopic calcification or acute changes.  Films are consistent with moderate osteoarthritis    PMFS History: Patient Active Problem List   Diagnosis Date Noted  . Bilateral primary  osteoarthritis of knee 09/17/2019   Past Medical History:  Diagnosis Date  . Enlarged prostate     History reviewed. No pertinent family history.  Past Surgical History:  Procedure Laterality Date  . CYSTOSCOPY WITH INSERTION OF UROLIFT     Social History   Occupational History  . Not on file  Tobacco Use  . Smoking status: Never Smoker  . Smokeless tobacco: Never Used  Substance and Sexual Activity  . Alcohol use: Never    Frequency: Never  . Drug use: Never  . Sexual activity: Not on file

## 2019-10-06 ENCOUNTER — Ambulatory Visit
Admission: RE | Admit: 2019-10-06 | Discharge: 2019-10-06 | Disposition: A | Discharge status: Home / Self Care | Payer: Medicare Other | Source: Ambulatory Visit | Attending: Orthopaedic Surgery | Admitting: Orthopaedic Surgery

## 2019-10-06 ENCOUNTER — Other Ambulatory Visit: Payer: Self-pay

## 2019-10-06 DIAGNOSIS — M170 Bilateral primary osteoarthritis of knee: Secondary | ICD-10-CM

## 2019-10-06 DIAGNOSIS — G8929 Other chronic pain: Secondary | ICD-10-CM

## 2019-10-16 ENCOUNTER — Other Ambulatory Visit: Payer: Self-pay | Admitting: Orthopaedic Surgery

## 2019-10-16 ENCOUNTER — Ambulatory Visit: Payer: Medicare Other | Admitting: Orthopaedic Surgery

## 2019-10-16 DIAGNOSIS — G8929 Other chronic pain: Secondary | ICD-10-CM

## 2019-10-16 DIAGNOSIS — M25561 Pain in right knee: Secondary | ICD-10-CM

## 2019-10-21 ENCOUNTER — Ambulatory Visit (INDEPENDENT_AMBULATORY_CARE_PROVIDER_SITE_OTHER): Payer: Medicare Other | Admitting: Orthopaedic Surgery

## 2019-10-21 ENCOUNTER — Other Ambulatory Visit: Payer: Self-pay

## 2019-10-21 ENCOUNTER — Encounter: Payer: Self-pay | Admitting: Orthopaedic Surgery

## 2019-10-21 DIAGNOSIS — M1712 Unilateral primary osteoarthritis, left knee: Secondary | ICD-10-CM | POA: Diagnosis not present

## 2019-10-21 DIAGNOSIS — M1711 Unilateral primary osteoarthritis, right knee: Secondary | ICD-10-CM | POA: Insufficient documentation

## 2019-10-21 NOTE — Progress Notes (Signed)
Office Visit Note   Patient: Tristan Hamilton           Date of Birth: 02/02/1951           MRN: [id number] Visit Date: 10/21/2019              Requested by: Garald Balding, MD [address] [place],  [place] [address] PCP: [name], MD   Assessment & Plan: Visit Diagnoses:  1. Unilateral primary osteoarthritis, left knee   2. Unilateral primary osteoarthritis, right knee     Plan: Given the severity of his arthritis he is not a candidate for partial knee replacement surgery.  Both knees have slight flexion contractures and varus malalignment but more importantly have severe arthritis involving all 3 compartments.  I showed him a knee model explained in detail what is going on with his knees.  We talked about operative and nonoperative treatment modalities.  He has tried and failed all forms of nonoperative treatment modalities and would like to proceed with knee replacement surgery.  He would like to having these both done at once.  I do feel this is reasonable given that he is thin, has severe arthritis in both knees, is not a diabetic and not a smoker.  However, he understands that there is significant heightened risk of acute blood loss anemia as well as nerve vessel injury and most importantly DVT.  I would have him on stronger blood thinning medication after surgery as well.  I showed him a knee replacement model.  We talked about his interoperative and postoperative course.  He would like to consider having surgery sometime after the first of the year.  All question concerns were answered addressed.  Work on having the schedule.  Follow-Up Instructions: Return for 2 weeks post-op.   Orders:  No orders of the defined types were placed in this encounter.  No orders of the defined types were placed in this encounter.     Procedures: No procedures performed   Clinical Data: No additional findings.   Subjective: Chief Complaint  Patient presents with  . Left  Knee - Pain  The patient is someone I am seeing for the first time.  He has known and well-documented tricompartmental arthritis of both knees.  He is a thin individual who is very active.  He is not a diabetic.  He has been dealing with knee pain for many years now.  He has been a long-term patient of orthopedic surgery up in [place] where he was from.  He has had steroid injections and hyaluronic acid injections in his knees.  He is also seen Dr. Durward Fortes down here.  He is someone that would like to consider having both knees replaced at the same time given the disease in both his knees.  He has talked to a therapist who has had this done as well before and he is highly motivated.  He is had conservative treatment for many years now.  The left knee hurts worse in the right knee.  He wanted to discuss whether or not he is a candidate for partial knee replacement surgery.  He does report difficulty getting down on both his knees.  He reports stiffness and daily pain.  At this point his knee arthritis is detriment affecting his mobility, his quality of life, and his actives daily living.  HPI  Review of Systems He currently denies any headache, chest pain, shortness of breath, fever, chills, nausea, vomiting  Objective:  Vital Signs: There were no vitals taken for this visit.  Physical Exam He is alert and orient x3 and in no acute distress Ortho Exam Examination of both knees shows significant varus malalignment.  Both knees have mild to moderate effusions.  Both knees have full range of motion and are ligamentously stable.  Both knees I can correct the varus malalignment slightly with stressing the knee. Specialty Comments:  No specialty comments available.  Imaging: No results found. Independent review of x-rays of both knees actually shows severe tricompartment arthritis involving all 3 compartments.  There is significant varus malalignment of both knees.  Both knees have bone-on-bone  wear on the medial aspect.  However there is marginal osteophytes of the lateral compartment of both knees and severe patellofemoral arthritic changes of both knees.  PMFS History: Patient Active Problem List   Diagnosis Date Noted  . Unilateral primary osteoarthritis, left knee 10/21/2019  . Unilateral primary osteoarthritis, right knee 10/21/2019  . Bilateral primary osteoarthritis of knee 09/17/2019   Past Medical History:  Diagnosis Date  . Enlarged prostate     History reviewed. No pertinent family history.  Past Surgical History:  Procedure Laterality Date  . CYSTOSCOPY WITH INSERTION OF UROLIFT     Social History   Occupational History  . Not on file  Tobacco Use  . Smoking status: Never Smoker  . Smokeless tobacco: Never Used  Substance and Sexual Activity  . Alcohol use: Never    Frequency: Never  . Drug use: Never  . Sexual activity: Not on file

## 2019-11-01 ENCOUNTER — Ambulatory Visit (INDEPENDENT_AMBULATORY_CARE_PROVIDER_SITE_OTHER): Payer: Medicare Other | Admitting: Family Medicine

## 2019-11-01 ENCOUNTER — Encounter: Payer: Self-pay | Admitting: Family Medicine

## 2019-11-01 ENCOUNTER — Other Ambulatory Visit: Payer: Self-pay

## 2019-11-01 DIAGNOSIS — C61 Malignant neoplasm of prostate: Secondary | ICD-10-CM

## 2019-11-01 DIAGNOSIS — M170 Bilateral primary osteoarthritis of knee: Secondary | ICD-10-CM | POA: Diagnosis not present

## 2019-11-01 DIAGNOSIS — E559 Vitamin D deficiency, unspecified: Secondary | ICD-10-CM | POA: Insufficient documentation

## 2019-11-01 DIAGNOSIS — E538 Deficiency of other specified B group vitamins: Secondary | ICD-10-CM

## 2019-11-01 DIAGNOSIS — N400 Enlarged prostate without lower urinary tract symptoms: Secondary | ICD-10-CM

## 2019-11-01 NOTE — Progress Notes (Signed)
Virtual Visit via Telephone Note  I connected with Tristan Hamilton 11/01/19 at  1:00 PM EST   by telephone and verified that I am speaking with the correct person using two identifiers.  We attempted to a virtual visit but he cannot get his camera and audio to work.   I discussed the limitations, risks, security and privacy concerns of performing an evaluation and management service by telephone and the availability of in person appointments. I also discussed with the patient that there may be a patient responsible charge related to this service. The patient expressed understanding and agreed to proceed.  Location patient: home Location provider: work office Participants present for the call: patient, provider Patient did not have a visit in the prior 7 days to address this/these issue(s).   Tristan Hamilton DOB: 07-19-1951 Encounter date: 11/01/2019  This isa 68 y.o. male who presents to establish care. Chief Complaint  Patient presents with  . Establish Care    History of present illness: He was supposed to be a new patient visit in the office today, but complained of sore throat so we were able to complete the visit via telephone.  He moved here this summer from [place].  Has been following with ortho for arthritis in knees - Dr. Durward Fortes, Dr. Ninfa Linden. Both knees are bone on bone. Has been putting off surgery for a long time. Getting to point of needing aleve every day even with cortisone shots. Has scheduled mid Jan to get both knees replaced at same time.   Was diagnosed with prostate cancer in march. They were monitoring with PSA. Hasn't seen a specialist here for this. Did have a couple of biopsies. Also had urolift due to enlarged prostate, was able to get off of medications after this.   Just woke up with a sore throat this morning. Feels fine otherwise. No fevers or other sick symptoms. No sick exposures.   Had [facility] visit. They brought up question:  ?pneuomonia vaccine? He had a shingles vaccine, but not sure what one. Didn't get flu shot this year.   Wife wondering about vitamin d level.  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 Started taking anti-histamine after aura. Then another friend recommended B12 and migraines essentially went away.   Used to have terrible leg cramps and seemed like they improved with vitamin D supplementation.   No issues with cholesterol and blood pressure in the past.   Just started seeing chiropractor for sciatic type pain down leg.   Past Medical History:  Diagnosis Date  . Enlarged prostate   . Osteoarthritis of both knees   . Prostate cancer [facility]) 2013   Physician in [place]   Past Surgical History:  Procedure Laterality Date  . CYSTOSCOPY WITH INSERTION OF UROLIFT    . right knee scope  1975  . skiing accident     stitches in head  . TESTICLE REMOVAL Left    undescended  . TONSILLECTOMY     No Known Allergies Current Meds  Medication Sig  . Cholecalciferol (VITAMIN D) 50 MCG (2000 UT) tablet Take 2,000 Units by mouth daily.  . cyanocobalamin 100 MCG tablet Take 100 mcg by mouth daily.  . naproxen sodium (ALEVE) 220 MG tablet Take 220 mg by mouth.   Social History   Tobacco Use  . Smoking status: Never Smoker  . Smokeless tobacco: Never Used  Substance Use Topics  . Alcohol use: Never  Frequency: Never   Family History  Problem Relation Age of Onset  . Diverticulitis Mother   . Penile cancer Father   . Healthy Sister   . Prostate cancer Maternal Grandfather      Review of Systems  Constitutional: Negative for chills, fatigue and fever.  HENT: Positive for sore throat. Negative for congestion, ear pain, postnasal drip, sinus pressure and sinus pain.   Respiratory: Negative for cough, chest tightness, shortness of breath and wheezing.   Cardiovascular: Negative for chest pain, palpitations and leg swelling.  Musculoskeletal: Positive for  arthralgias.    Objective:  There were no vitals taken for this visit.      BP Readings from Last 3 Encounters:  09/17/19 136/78   Wt Readings from Last 3 Encounters:  09/17/19 200 lb (90.7 kg)    Physical Exam Observations/Objective: Patient sounds cheerful and well on the phone. I do not appreciate any SOB. Speech and thought processing are grossly intact.   Assessment/Plan:  1. Bilateral primary osteoarthritis of knee He is following with Ortho.  He is planning to have surgery in January, so we will have him return to the office for preoperative physical.  2. Prostate cancer [facility]) He is going to try to get his urology records.  I am not sure if he is to be able to call and just have this sent to [name] or we will need to sign a release.  We will see if we can get him set up through my chart so that he can communicate with [name] for this.  We will plan to repeat blood work at his next visit in office and can recheck a PSA at that time.  Referral for urology was also placed today. - Ambulatory referral to Urology  3. Enlarged prostate States that he has not been symptomatic with this ever since having a UroLift procedure.  4. Vitamin D deficiency History of vitamin D deficiency.  He is taking supplement regularly.  5. Vitamin B 12 deficiency History of B12 deficiency and taking supplement regularly.  [id number] [id number] [id number] 11-20 [id number] 21-30 I did not refer this patient for an OV in the next 24 hours for this/these issue(s).  I discussed the assessment and treatment plan with the patient. The patient was provided an opportunity to ask questions and all were answered. The patient agreed with the plan and demonstrated an understanding of the instructions.   The patient was advised to call back or seek an in-person evaluation if the symptoms worsen or if the condition fails to improve as anticipated.  I provided 25 minutes of non-face-to-face time during this encounter.  Return for  physical exam next month.  Micheline Rough, MD

## 2019-11-05 ENCOUNTER — Telehealth: Payer: Self-pay | Admitting: *Deleted

## 2019-11-05 NOTE — Telephone Encounter (Signed)
Appt scheduled for 12/18.

## 2019-11-05 NOTE — Telephone Encounter (Signed)
I called the pt and informed him it appears he is already set up for Mychart and he stated he set this up himself a few days ago.  Medical records release form was mailed to the pts home address and he is aware to complete this, and bring or mail back in.

## 2019-11-05 NOTE — Telephone Encounter (Signed)
-----   Message from [name], MD sent at 11/01/2019  1:46 PM EST ----- Please sent mychart link to: [email or web address] touch base with him to figure out how we can get record release from previous primary/urology.

## 2019-11-05 NOTE — Telephone Encounter (Signed)
-----   Message from [name], MD sent at 11/01/2019  5:42 PM EST ----- Sorry should have included this with previous message; he will need preop exam in office. Can schedule for december

## 2019-12-06 ENCOUNTER — Ambulatory Visit: Payer: Medicare Other | Admitting: Family Medicine

## 2020-01-01 ENCOUNTER — Other Ambulatory Visit: Payer: Self-pay

## 2020-01-13 ENCOUNTER — Ambulatory Visit: Payer: Medicare Other | Admitting: Family Medicine

## 2020-01-21 ENCOUNTER — Encounter: Payer: Self-pay | Admitting: Family Medicine

## 2020-01-21 DIAGNOSIS — N281 Cyst of kidney, acquired: Secondary | ICD-10-CM | POA: Insufficient documentation

## 2020-02-04 ENCOUNTER — Other Ambulatory Visit: Payer: Self-pay

## 2020-02-05 ENCOUNTER — Encounter: Payer: Self-pay | Admitting: Family Medicine

## 2020-02-05 ENCOUNTER — Ambulatory Visit (INDEPENDENT_AMBULATORY_CARE_PROVIDER_SITE_OTHER): Payer: Medicare Other | Admitting: Family Medicine

## 2020-02-05 VITALS — BP 110/60 | HR 68 | Temp 98.1°F | Ht 70.0 in | Wt 195.5 lb

## 2020-02-05 DIAGNOSIS — R002 Palpitations: Secondary | ICD-10-CM | POA: Diagnosis not present

## 2020-02-05 DIAGNOSIS — M1711 Unilateral primary osteoarthritis, right knee: Secondary | ICD-10-CM | POA: Diagnosis not present

## 2020-02-05 DIAGNOSIS — E538 Deficiency of other specified B group vitamins: Secondary | ICD-10-CM | POA: Diagnosis not present

## 2020-02-05 DIAGNOSIS — R252 Cramp and spasm: Secondary | ICD-10-CM

## 2020-02-05 DIAGNOSIS — Z01818 Encounter for other preprocedural examination: Secondary | ICD-10-CM

## 2020-02-05 DIAGNOSIS — E559 Vitamin D deficiency, unspecified: Secondary | ICD-10-CM | POA: Diagnosis not present

## 2020-02-05 DIAGNOSIS — Z23 Encounter for immunization: Secondary | ICD-10-CM | POA: Diagnosis not present

## 2020-02-05 DIAGNOSIS — M1712 Unilateral primary osteoarthritis, left knee: Secondary | ICD-10-CM | POA: Diagnosis not present

## 2020-02-05 DIAGNOSIS — Z131 Encounter for screening for diabetes mellitus: Secondary | ICD-10-CM

## 2020-02-05 DIAGNOSIS — Z13220 Encounter for screening for lipoid disorders: Secondary | ICD-10-CM

## 2020-02-05 LAB — CBC WITH DIFFERENTIAL/PLATELET
Absolute Monocytes: 748 cells/uL (ref 200–950)
Basophils Absolute: 20 cells/uL (ref 0–200)
Basophils Relative: 0.3 %
Eosinophils Absolute: 122 cells/uL (ref 15–500)
Eosinophils Relative: 1.8 %
HCT: 46.6 % (ref 38.5–50.0)
Hemoglobin: 16.1 g/dL (ref 13.2–17.1)
Lymphs Abs: 1680 cells/uL (ref 850–3900)
MCH: 31.7 pg (ref 27.0–33.0)
MCHC: 34.5 g/dL (ref 32.0–36.0)
MCV: 91.7 fL (ref 80.0–100.0)
MPV: 11.1 fL (ref 7.5–12.5)
Monocytes Relative: 11 %
Neutro Abs: 4230 cells/uL (ref 1500–7800)
Neutrophils Relative %: 62.2 %
Platelets: 201 10*3/uL (ref 140–400)
RBC: 5.08 10*6/uL (ref 4.20–5.80)
RDW: 12.4 % (ref 11.0–15.0)
Total Lymphocyte: 24.7 %
WBC: 6.8 10*3/uL (ref 3.8–10.8)

## 2020-02-05 NOTE — Progress Notes (Signed)
Tristan Hamilton Date of Birth:  06-05-1951  This patient presents to the office today for a preoperative consultation at the request of surgeon, Dr. Ninfa Linden, who plans on performing bilateral total knee replacements onApril 6.    Planned anesthesia: general  Known anesthesia problems: no  Bleeding risk: no  Personal or FH of DVT/PE: blood clot in leg discovered incidentally in left leg. Told uncertain cause. Put on aspirin only. Doesn't remember what was being evaluated when this was discovered.    Feeling well; no sick symptoms. No shortness of breath. Last few weeks will notice when sitting that heart will beat a little faster; comes and goes. Lasts less than a minute. Not light headed, dizzy, not short of breathe. 2 weeks ago first noticed. Not felt when up and active. Has noted multiple times/day; even every 30-60 minutes.   Recently increased vitamin D which has helped with leg cramps. Has had deficiency in past.   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 Friend recommended taking anti-histamine when he would get aura. Then another friend told him to take B12; seemed to improve and went away.   Not exercising regularly. Had friend that was worse off when exercising prior to knee replacements so worries about this.  Patient Active Problem List   Diagnosis Date Noted  . Acquired renal cyst of right kidney 01/21/2020  . Prostate cancer ([place]) 11/01/2019  . Enlarged prostate 11/01/2019  . Vitamin D deficiency 11/01/2019  . Unilateral primary osteoarthritis, left knee 10/21/2019  . Unilateral primary osteoarthritis, right knee 10/21/2019  . Bilateral primary osteoarthritis of knee 09/17/2019   Past Surgical History:  Procedure Laterality Date  . CYSTOSCOPY WITH INSERTION OF UROLIFT    . right knee scope  1975  . skiing accident     stitches in head  . TESTICLE REMOVAL Left    undescended  . TONSILLECTOMY    . VASECTOMY      No Known Allergies Current Meds   Medication Sig  . Cholecalciferol (VITAMIN D) 50 MCG (2000 UT) tablet Take 2,000 Units by mouth daily.  . cyanocobalamin 100 MCG tablet Take 100 mcg by mouth daily.  . naproxen sodium (ALEVE) 220 MG tablet Take 220 mg by mouth.    Social History   Tobacco Use  . Smoking status: Never Smoker  . Smokeless tobacco: Never Used  Substance Use Topics  . Alcohol use: Never   Family History  Problem Relation Age of Onset  . Diverticulitis Mother   . Penile cancer Father   . Healthy Sister   . Prostate cancer Maternal Grandfather     Review of Systems Review of Systems  Constitutional: Negative for chills, fever and malaise/fatigue.  HENT: Negative for congestion, hearing loss and tinnitus.   Respiratory: Negative for cough, shortness of breath and wheezing.   Cardiovascular: Positive for palpitations. Negative for chest pain, orthopnea, claudication, leg swelling and PND.  Musculoskeletal: Positive for joint pain (both knees).  Neurological: Negative for dizziness and headaches.   Recent Labs: CBC: No results found for: WBC, HGB, HCT, MCH, MCHC, RDW, PLT, MPV CMP: No results found for: NA, K, CL, CO2, ANIONGAP, GLUCOSE, BUN, CREATININE, LABGLOB, GFRAA, CALCIUM, PROT, AGRATIO, BILITOT, ALKPHOS, ALT, AST, GLOB  HBA1C: No results found for: LABA1C, EAG  Objective:   BP 110/60 (BP Location: Left Arm, Patient Position: Sitting, Cuff Size: Large)   Pulse 68   Temp 98.1 F (36.7 C) (Temporal)   Ht 5\' 10"  (1.778 m)  Wt 195 lb 8 oz (88.7 kg)   SpO2 98%   BMI 28.05 kg/m  Weight: 195 lb 8 oz (88.7 kg)  Physical Exam Constitutional:      General: He is not in acute distress.    Appearance: He is well-developed.  HENT:     Head: Normocephalic and atraumatic.     Right Ear: External ear normal.     Left Ear: External ear normal.     Nose: Nose normal.     Mouth/Throat:     Pharynx: No oropharyngeal exudate.  Eyes:     Conjunctiva/sclera: Conjunctivae normal.     Pupils:  Pupils are equal, round, and reactive to light.  Neck:     Thyroid: No thyromegaly.  Cardiovascular:     Rate and Rhythm: Normal rate and regular rhythm.     Heart sounds: Murmur present. Systolic murmur present with a grade of 2/6. No friction rub. No gallop.   Pulmonary:     Effort: Pulmonary effort is normal. No respiratory distress.     Breath sounds: Normal breath sounds. No stridor. No wheezing or rales.  Abdominal:     General: Bowel sounds are normal.     Palpations: Abdomen is soft.  Musculoskeletal:        General: Normal range of motion.     Cervical back: Neck supple.     Comments: Bony enlargement bilat knees with genu varum deformity  Skin:    General: Skin is warm and dry.  Neurological:     Mental Status: He is alert and oriented to person, place, and time.  Psychiatric:        Behavior: Behavior normal.        Thought Content: Thought content normal.        Judgment: Judgment normal.      EKG Interpretation:  sinus bradycardia.  Lab Review: labwork ordered today    Assessment:   [place] was seen today for medical clearance.  Diagnoses and all orders for this visit:  Unilateral primary osteoarthritis, left knee  Unilateral primary osteoarthritis, right knee  Vitamin D deficiency -     VITAMIN D 25 Hydroxy (Vit-D Deficiency, Fractures); Future  Vitamin B 12 deficiency -     Vitamin B12; Future  Palpitations: will need further evaluation. EKG reassuring. Will start with bloodwork. If still experiencing sx will get holter.  -     CBC with Differential/Platelet; Future -     Comprehensive metabolic panel; Future -     TSH; Future -     EKG 12-Lead  Screening for diabetes mellitus -     Hemoglobin A1c; Future  Lipid screening -     Lipid panel; Future  Leg cramps -     Magnesium, RBC; Future  Preoperative evaluation to rule out surgical contraindication -     Protime-INR; Future -     Pneumococcal polysaccharide vaccine 23-valent greater  than or equal to 2yo subcutaneous/IM   69 y.o.patient  approved for Surgery     Plan:   1. Preoperative workup as follows:labwork, consider holter 2. Change in medication regimen before surgery: hold supplements 72 hours prior to surgery; NSAID holding per ortho recommendation. 3. Will give stability recommendations pending bloodwork results/followup.  Note electronically signed by provider. Return for pending bloodwork.  Micheline Rough, MD

## 2020-02-07 ENCOUNTER — Telehealth: Payer: Self-pay | Admitting: Radiology

## 2020-02-07 ENCOUNTER — Encounter: Payer: Self-pay | Admitting: Family Medicine

## 2020-02-07 LAB — UNLABELED: Test Ordered On Req: 623

## 2020-02-07 NOTE — Addendum Note (Signed)
Addended by: Agnes Lawrence on: 02/07/2020 11:35 AM   Modules accepted: Orders

## 2020-02-07 NOTE — Telephone Encounter (Addendum)
Enrolled patient for a 3 day Zio monitor to be mailed to the patients home. Brief instructions were gone over with the patient and he knows to expect the monitor to arrive in 5-6 days 48hr holter was switch to a 3 day Zio for mailing purposes during Covid

## 2020-02-08 LAB — HEMOGLOBIN A1C
Hgb A1c MFr Bld: 5.5 % of total Hgb (ref <5.7)
Mean Plasma Glucose: 111 (calc)
eAG (mmol/L): 6.2 (calc)

## 2020-02-08 LAB — TSH: TSH: 1.13 mIU/L (ref 0.40–4.50)

## 2020-02-08 LAB — VITAMIN B12: Vitamin B-12: 963 pg/mL (ref 200–1100)

## 2020-02-08 LAB — MAGNESIUM: Magnesium: 2 mg/dL (ref 1.5–2.5)

## 2020-02-08 LAB — COMPREHENSIVE METABOLIC PANEL
AG Ratio: 2 (calc) (ref 1.0–2.5)
ALT: 24 U/L (ref 9–46)
AST: 22 U/L (ref 10–35)
Albumin: 4.3 g/dL (ref 3.6–5.1)
Alkaline phosphatase (APISO): 78 U/L (ref 35–144)
BUN: 17 mg/dL (ref 7–25)
CO2: 30 mmol/L (ref 20–32)
Calcium: 10 mg/dL (ref 8.6–10.3)
Chloride: 104 mmol/L (ref 98–110)
Creat: 0.99 mg/dL (ref 0.70–1.25)
Globulin: 2.2 g/dL (calc) (ref 1.9–3.7)
Glucose, Bld: 88 mg/dL (ref 65–99)
Potassium: 4.8 mmol/L (ref 3.5–5.3)
Sodium: 142 mmol/L (ref 135–146)
Total Bilirubin: 0.6 mg/dL (ref 0.2–1.2)
Total Protein: 6.5 g/dL (ref 6.1–8.1)

## 2020-02-08 LAB — LIPID PANEL
Cholesterol: 213 mg/dL — ABNORMAL HIGH (ref <200)
HDL: 54 mg/dL (ref >=40)
LDL Cholesterol (Calc): 117 mg/dL (calc) — ABNORMAL HIGH
Non-HDL Cholesterol (Calc): 159 mg/dL (calc) — ABNORMAL HIGH (ref <130)
Total CHOL/HDL Ratio: 3.9 (calc) (ref <5.0)
Triglycerides: 292 mg/dL — ABNORMAL HIGH (ref <150)

## 2020-02-08 LAB — PROTIME-INR
INR: 1
Prothrombin Time: 10.9 s (ref 9.0–11.5)

## 2020-02-08 LAB — TEST AUTHORIZATION

## 2020-02-10 NOTE — Addendum Note (Signed)
Addended by: Suzette Battiest on: 02/10/2020 09:57 AM   Modules accepted: Orders

## 2020-02-12 ENCOUNTER — Ambulatory Visit (INDEPENDENT_AMBULATORY_CARE_PROVIDER_SITE_OTHER): Payer: Medicare Other

## 2020-02-12 DIAGNOSIS — R002 Palpitations: Secondary | ICD-10-CM

## 2020-02-14 LAB — MISLABELED CANCEL: Test Affected: 623

## 2020-02-14 LAB — MAGNESIUM, RBC

## 2020-02-22 ENCOUNTER — Telehealth: Payer: Self-pay | Admitting: Physician Assistant

## 2020-02-22 NOTE — Telephone Encounter (Signed)
I received a phone call from [employer].  The patient's monitor showed bradycardia with HR 38 on 02/14/2020.  There were no reports of syncope.  This patient is not followed by any of our cardiologists. The monitor was ordered by primary care.  I will fwd this not to primary care to make them aware of the monitor results. Richardson Dopp, PA-C    02/22/2020 1:25 PM

## 2020-02-23 NOTE — Telephone Encounter (Signed)
Please let patient know and put in urgent referral for cardiology. He is supposed to be having surgery soon and I would like for them to officially review before this. His surgery has been delayed due to [place] and if cardiology is able to see him prior to surgery date I know he would appreciate this.  Please make sure he is feeling ok; not light headed, dizzy.

## 2020-02-24 DIAGNOSIS — R001 Bradycardia, unspecified: Secondary | ICD-10-CM

## 2020-02-24 HISTORY — DX: Bradycardia, unspecified: R00.1

## 2020-02-24 NOTE — Telephone Encounter (Signed)
Spoke with the pt and informed him of the message below. 

## 2020-02-25 ENCOUNTER — Other Ambulatory Visit: Payer: Self-pay | Admitting: Family Medicine

## 2020-02-25 DIAGNOSIS — R001 Bradycardia, unspecified: Secondary | ICD-10-CM

## 2020-02-27 ENCOUNTER — Ambulatory Visit: Payer: Medicare Other | Admitting: Cardiology

## 2020-03-09 NOTE — Progress Notes (Signed)
Cardiology Office Note:    Date:  03/11/2020   ID:  Tristan Hamilton, DOB [birth year], MRN [id number]  PCP:  [name], MD  Cardiologist:  No primary care provider on file.  Electrophysiologist:  None   Referring MD: [name], MD   Chief Complaint  Patient presents with  . Bradycardia    History of Present Illness:    Tristan Hamilton is a 69 y.o. male with a hx of prostate cancer, DVT who is referred by Dr. Ethlyn Gallery for evaluation of bradycardia.  He was seen by Dr. Ethlyn Gallery for preop evaluation prior to bilateral total knee replacements, planned for April 6.  He reported palpitations and Zio patch x2 days was ordered.  Zio patch showed sinus bradycardia down to heart rate 35 bpm.  Patient reports that he is very active.  Recently moved to [place] and has been doing multiple projects on his house and his daughter's house.  Has been cutting down trees and carrying wood.  He denies any exertional chest pain or dyspnea.  He can walk up 2 flights of stairs without stopping.  Reports that the symptoms he felt when he wore his monitor were that his heart was pounding.  He denies any lightheadedness, syncope or presyncope, chest pain, or dyspnea.  He has a history of an incidental DVT that was found in his left knee years ago, he states that he took a blood thinner for 6 months or so, and has not had recurrence.  No smoking history.  No history of heart disease in his immediate family.    Past Medical History:  Diagnosis Date  . Enlarged prostate   . Osteoarthritis of both knees   . Prostate cancer [facility]) 2013   Physician in [place]    Past Surgical History:  Procedure Laterality Date  . CYSTOSCOPY WITH INSERTION OF UROLIFT    . right knee scope  1975  . skiing accident     stitches in head  . TESTICLE REMOVAL Left    undescended  . TONSILLECTOMY    . VASECTOMY      Current Medications: Current Meds  Medication Sig  . Cholecalciferol 1.25 MG (50000 UT) capsule  Take 1 capsule by mouth daily.  . naproxen sodium (ALEVE) 220 MG tablet Take 220 mg by mouth.  . vitamin B-12 (CYANOCOBALAMIN) 1000 MCG tablet Take 1,000 mcg by mouth daily.     Allergies:   Patient has no known allergies.   Social History   Socioeconomic History  . Marital status: Married    Spouse name: Not on file  . Number of children: Not on file  . Years of education: Not on file  . Highest education level: Not on file  Occupational History  . Not on file  Tobacco Use  . Smoking status: Never Smoker  . Smokeless tobacco: Never Used  Substance and Sexual Activity  . Alcohol use: Never  . Drug use: Never  . Sexual activity: Not on file  Other Topics Concern  . Not on file  Social History Narrative  . Not on file   Social Determinants of Health   Financial Resource Strain:   . Difficulty of Paying Living Expenses:   Food Insecurity:   . Worried About Charity fundraiser in the Last Year:   . Arboriculturist in the Last Year:   Transportation Needs:   . Film/video editor (Medical):   Marland Kitchen Lack of Transportation (Non-Medical):   Physical Activity:   .  Days of Exercise per Week:   . Minutes of Exercise per Session:   Stress:   . Feeling of Stress :   Social Connections:   . Frequency of Communication with Friends and Family:   . Frequency of Social Gatherings with Friends and Family:   . Attends Religious Services:   . Active Member of Clubs or Organizations:   . Attends Archivist Meetings:   Marland Kitchen Marital Status:      Family History: The patient's family history includes Diverticulitis in his mother; Healthy in his sister; Penile cancer in his father; Prostate cancer in his maternal grandfather.  ROS:   Please see the history of present illness.     All other systems reviewed and are negative.  EKGs/Labs/Other Studies Reviewed:    The following studies were reviewed today:   EKG:  EKG is ordered today.  The ekg ordered today demonstrates  sinus bradycardia, rate 55, no ST/T nabormalities  Cardiac monitor 02/24/20:  Sinus bradycardia, normal sinus rhythm and sinus tachycardia. The average heart rate was 63 bpm and ranged from 35-137  Occasional PVCs, bigeminal PVCs, trigeminal PVCs  Occasional PACs and nonsustained atrial tachycardia up to 4 beats.  Patient symptomatic during bradycardia with HR as low as 35bpm during awake hours.     Recent Labs: 02/05/2020: ALT 24; BUN 17; Creat 0.99; Hemoglobin 16.1; Magnesium 2.0; Platelets 201; Potassium 4.8; Sodium 142; TSH 1.13  Recent Lipid Panel    Component Value Date/Time   CHOL 213 (H) 02/05/2020 1526   TRIG 292 (H) 02/05/2020 1526   HDL 54 02/05/2020 1526   CHOLHDL 3.9 02/05/2020 1526   LDLCALC 117 (H) 02/05/2020 1526    Physical Exam:    VS:  BP (!) 154/72   Pulse (!) 55   Ht 5\' 10"  (1.778 m)   Wt 199 lb 3.2 oz (90.4 kg)   BMI 28.58 kg/m     Wt Readings from Last 3 Encounters:  03/11/20 199 lb 3.2 oz (90.4 kg)  03/11/20 195 lb (88.5 kg)  02/05/20 195 lb 8 oz (88.7 kg)     GEN:  Well nourished, well developed in no acute distress HEENT: Normal NECK: No JVD; No carotid bruits LYMPHATICS: No lymphadenopathy CARDIAC: bradycardic, regular, no murmurs, rubs, gallops RESPIRATORY:  Clear to auscultation without rales, wheezing or rhonchi  ABDOMEN: Soft, non-tender, non-distended MUSCULOSKELETAL:  No edema; No deformity  SKIN: Warm and dry NEUROLOGIC:  Alert and oriented x 3 PSYCHIATRIC:  Normal affect   ASSESSMENT:    1. Bradycardia   2. Pre-operative cardiovascular examination   3. PVC's (premature ventricular contractions)    PLAN:    Sinus bradycardia: Heart rate down to 40s while awake on recent monitor.  There were multiple patient triggered events, which primarily corresponded to normal heart rates but there was one patient triggered event with a heart rate 40 bpm, with reported symptoms of fluttering/heart racing.  He denies any lightheadedness,  syncope, presyncope, fatigue, chest pain, or dyspnea.  As does not appear symptomatic from bradycardia, no indication for pacemaker at this time.  Will check TTE to evaluate for structural heart disease  PVCs: 1.4% of beats on recent monitor.  Will check TTE to evaluate for structural heart disease  Preop evaluation: Prior to knee surgery.  No symptoms to suggest active cardiac condition.  Excellent functional capacity.  RCRI score is 0.  Overall would classify as low risk for an intermediate risk surgery.  Planning TTE to evaluate bradycardia/PVCs as  above.  If unremarkable, no further cardiac work-up recommended  RTC 2 months   Medication Adjustments/Labs and Tests Ordered: Current medicines are reviewed at length with the patient today.  Concerns regarding medicines are outlined above.  Orders Placed This Encounter  Procedures  . EKG 12-Lead  . ECHOCARDIOGRAM COMPLETE   No orders of the defined types were placed in this encounter.   Patient Instructions  Medication Instructions:  Your physician recommends that you continue on your current medications as directed. Please refer to the Current Medication list given to you today.  Lab Work: NONE  Testing/Procedures: Your physician has requested that you have an echocardiogram ASAP. Echocardiography is a painless test that uses sound waves to create images of your heart. It provides your doctor with information about the size and shape of your heart and how well your heart's chambers and valves are working. This procedure takes approximately one hour. There are no restrictions for this procedure. This will be done at our [facility] location:  [place]: At [facility], you and your health needs are our priority.  As part of our continuing mission to provide you with exceptional heart care, we have created designated Provider Care Teams.  These Care Teams include your primary Cardiologist (physician)  and Advanced Practice Providers (APPs -  Physician Assistants and Nurse Practitioners) who all work together to provide you with the care you need, when you need it.  We recommend signing up for the patient portal called "MyChart".  Sign up information is provided on this After Visit Summary.  MyChart is used to connect with patients for Virtual Visits (Telemedicine).  Patients are able to view lab/test results, encounter notes, upcoming appointments, etc.  Non-urgent messages can be sent to your provider as well.   To learn more about what you can do with MyChart, go to [email or web address].    Your next appointment:   2 month(s)  The format for your next appointment:   In Person  Provider:   Oswaldo Milian, MD       Signed, Donato Heinz, MD  03/11/2020 6:31 PM    [place]

## 2020-03-11 ENCOUNTER — Ambulatory Visit (INDEPENDENT_AMBULATORY_CARE_PROVIDER_SITE_OTHER): Payer: Medicare Other | Admitting: Cardiology

## 2020-03-11 ENCOUNTER — Other Ambulatory Visit: Payer: Self-pay

## 2020-03-11 ENCOUNTER — Encounter: Payer: Self-pay | Admitting: Cardiology

## 2020-03-11 ENCOUNTER — Ambulatory Visit (INDEPENDENT_AMBULATORY_CARE_PROVIDER_SITE_OTHER): Payer: Medicare Other | Admitting: Orthopaedic Surgery

## 2020-03-11 VITALS — Ht 70.0 in | Wt 195.0 lb

## 2020-03-11 VITALS — BP 154/72 | HR 55 | Ht 70.0 in | Wt 199.2 lb

## 2020-03-11 DIAGNOSIS — M1712 Unilateral primary osteoarthritis, left knee: Secondary | ICD-10-CM | POA: Diagnosis not present

## 2020-03-11 DIAGNOSIS — R001 Bradycardia, unspecified: Secondary | ICD-10-CM

## 2020-03-11 DIAGNOSIS — Z01810 Encounter for preprocedural cardiovascular examination: Secondary | ICD-10-CM

## 2020-03-11 DIAGNOSIS — I493 Ventricular premature depolarization: Secondary | ICD-10-CM | POA: Diagnosis not present

## 2020-03-11 DIAGNOSIS — M1711 Unilateral primary osteoarthritis, right knee: Secondary | ICD-10-CM | POA: Diagnosis not present

## 2020-03-11 NOTE — Patient Instructions (Signed)
Medication Instructions:  Your physician recommends that you continue on your current medications as directed. Please refer to the Current Medication list given to you today.  Lab Work: NONE  Testing/Procedures: Your physician has requested that you have an echocardiogram ASAP. Echocardiography is a painless test that uses sound waves to create images of your heart. It provides your doctor with information about the size and shape of your heart and how well your heart's chambers and valves are working. This procedure takes approximately one hour. There are no restrictions for this procedure. This will be done at our [facility] location:  [place]: At [employer], you and your health needs are our priority.  As part of our continuing mission to provide you with exceptional heart care, we have created designated Provider Care Teams.  These Care Teams include your primary Cardiologist (physician) and Advanced Practice Providers (APPs -  Physician Assistants and Nurse Practitioners) who all work together to provide you with the care you need, when you need it.  We recommend signing up for the patient portal called "MyChart".  Sign up information is provided on this After Visit Summary.  MyChart is used to connect with patients for Virtual Visits (Telemedicine).  Patients are able to view lab/test results, encounter notes, upcoming appointments, etc.  Non-urgent messages can be sent to your provider as well.   To learn more about what you can do with MyChart, go to [email or web address].    Your next appointment:   2 month(s)  The format for your next appointment:   In Person  Provider:   Oswaldo Milian, MD

## 2020-03-11 NOTE — Progress Notes (Signed)
The patient is here with his wife today to discuss his upcoming bilateral knee replacement surgery.  This is on April 6 at [facility].  He and his wife had a lot of very appropriate questions as it relates to his interoperative and postoperative course.  They wanted to know what to expect when he gets home.  We discussed the intricacies of knee replacement surgery.  I showed her, his wife, and knee replacement model and discussed how the surgery is performed.  All question concerns were answered and addressed.  We will see him on the day of surgery March 24, 2020.

## 2020-03-12 ENCOUNTER — Ambulatory Visit (HOSPITAL_COMMUNITY)
Admission: RE | Admit: 2020-03-12 | Discharge: 2020-03-12 | Disposition: A | Discharge status: Home / Self Care | Payer: Medicare Other | Source: Ambulatory Visit | Attending: Cardiology | Admitting: Cardiology

## 2020-03-12 DIAGNOSIS — R001 Bradycardia, unspecified: Secondary | ICD-10-CM | POA: Diagnosis not present

## 2020-03-12 DIAGNOSIS — R9431 Abnormal electrocardiogram [ECG] [EKG]: Secondary | ICD-10-CM | POA: Insufficient documentation

## 2020-03-12 DIAGNOSIS — I351 Nonrheumatic aortic (valve) insufficiency: Secondary | ICD-10-CM | POA: Insufficient documentation

## 2020-03-12 DIAGNOSIS — R002 Palpitations: Secondary | ICD-10-CM | POA: Insufficient documentation

## 2020-03-12 DIAGNOSIS — Z01810 Encounter for preprocedural cardiovascular examination: Secondary | ICD-10-CM

## 2020-03-12 NOTE — Progress Notes (Signed)
  Echocardiogram 2D Echocardiogram has been performed.  Tristan Hamilton 03/12/2020, 2:01 PM

## 2020-03-17 ENCOUNTER — Other Ambulatory Visit: Payer: Self-pay | Admitting: Family

## 2020-03-19 NOTE — Progress Notes (Signed)
[place] [address] [place] [address] Phone: [phone] Fax: [phone]      Your procedure is scheduled on 03/24/2020 Tuesday.  Report to [facility] Main Entrance "A" at 10:00 A.M., and check in at the Admitting office.  Call this number if you have problems the morning of surgery:  [phone]  Call [phone] if you have any questions prior to your surgery date Monday-Friday 8am-4pm    Remember:  Do not eat or drink after midnight the night before your surgery  You may drink clear liquids until 9:00am the morning of your surgery.   Clear liquids allowed are: Water, Non-Citrus Juices (without pulp), Carbonated Beverages, Clear Tea, Black Coffee Only, and Gatorade   Enhanced Recovery after Surgery for Orthopedics Enhanced Recovery after Surgery is a protocol used to improve the stress on your body and your recovery after surgery.  Patient Instructions  . The night before surgery:  o No food after midnight. ONLY clear liquids after midnight o Finish Ensure drink by 9:00am morning of surgery  .  Marland Kitchen The day of surgery (if you do NOT have diabetes):  o Drink ONE (1) Pre-Surgery Clear Ensure as directed.   o This drink was given to you during your hospital  pre-op appointment visit. o The pre-op nurse will instruct you on the time to drink the  Pre-Surgery Ensure depending on your surgery time. o Finish the drink at the designated time by the pre-op nurse.  o Nothing else to drink after completing the  Pre-Surgery Clear Ensure.         If you have questions, please contact your surgeon's office.     Take these medicines the morning of surgery with A SIP OF WATER   None  As of today, STOP taking any Aspirin (unless otherwise instructed by your surgeon) and Aspirin containing products, Aleve, Naproxen, Ibuprofen, Motrin, Advil, Goody's, BC's, all herbal medications, fish oil, and all vitamins.                       Do not wear jewelry            Do not wear lotions, powders, colognes, or deodorant.            Men may shave face and neck.            Do not bring valuables to the hospital.            [facility] is not responsible for any belongings or valuables.  Do NOT Smoke (Tobacco/Vapping) or drink Alcohol 24 hours prior to your procedure If you use a CPAP at night, you may bring all equipment for your overnight stay.   Contacts, glasses, dentures or bridgework may not be worn into surgery.      For patients admitted to the hospital, discharge time will be determined by your treatment team.   Patients discharged the day of surgery will not be allowed to drive home, and someone needs to stay with them for 24 hours.    Special instructions:   [place]- Preparing For Surgery  Before surgery, you can play an important role. Because skin is not sterile, your skin needs to be as free of germs as possible. You can reduce the number of germs on your skin by washing with CHG (chlorahexidine gluconate) Soap before surgery.  CHG is an antiseptic cleaner which kills germs and bonds with  the skin to continue killing germs even after washing.    Oral Hygiene is also important to reduce your risk of infection.  Remember - BRUSH YOUR TEETH THE MORNING OF SURGERY WITH YOUR REGULAR TOOTHPASTE  Please do not use if you have an allergy to CHG or antibacterial soaps. If your skin becomes reddened/irritated stop using the CHG.  Do not shave (including legs and underarms) for at least 48 hours prior to first CHG shower. It is OK to shave your face.  Please follow these instructions carefully.   1. Shower the NIGHT BEFORE SURGERY and the MORNING OF SURGERY with CHG Soap.   2. If you chose to wash your hair, wash your hair first as usual with your normal shampoo.  3. After you shampoo, rinse your hair and body thoroughly to remove the shampoo.  4. Use CHG as you would any other liquid soap. You can  apply CHG directly to the skin and wash gently with a scrungie or a clean washcloth.   5. Apply the CHG Soap to your body ONLY FROM THE NECK DOWN.  Do not use on open wounds or open sores. Avoid contact with your eyes, ears, mouth and genitals (private parts). Wash Face and genitals (private parts)  with your normal soap.   6. Wash thoroughly, paying special attention to the area where your surgery will be performed.  7. Thoroughly rinse your body with warm water from the neck down.  8. DO NOT shower/wash with your normal soap after using and rinsing off the CHG Soap.  9. Pat yourself dry with a CLEAN TOWEL.  10. Wear CLEAN PAJAMAS to bed the night before surgery, wear comfortable clothes the morning of surgery  11. Place CLEAN SHEETS on your bed the night of your first shower and DO NOT SLEEP WITH PETS.   Day of Surgery:   Do not apply any deodorants/lotions.  Please wear clean clothes to the hospital/surgery center.   Remember to brush your teeth WITH YOUR REGULAR TOOTHPASTE.   Please read over the following fact sheets that you were given.

## 2020-03-20 ENCOUNTER — Other Ambulatory Visit: Payer: Self-pay

## 2020-03-20 ENCOUNTER — Encounter (HOSPITAL_COMMUNITY): Payer: Self-pay

## 2020-03-20 ENCOUNTER — Encounter (HOSPITAL_COMMUNITY)
Admission: RE | Admit: 2020-03-20 | Discharge: 2020-03-20 | Disposition: A | Discharge status: Home / Self Care | Payer: Medicare Other | Source: Ambulatory Visit | Attending: Orthopaedic Surgery | Admitting: Orthopaedic Surgery

## 2020-03-20 ENCOUNTER — Other Ambulatory Visit (HOSPITAL_COMMUNITY)
Admission: RE | Admit: 2020-03-20 | Discharge: 2020-03-20 | Disposition: A | Discharge status: Home / Self Care | Payer: Medicare Other | Source: Ambulatory Visit | Attending: Orthopaedic Surgery | Admitting: Orthopaedic Surgery

## 2020-03-20 DIAGNOSIS — Z20822 Contact with and (suspected) exposure to covid-19: Secondary | ICD-10-CM | POA: Insufficient documentation

## 2020-03-20 DIAGNOSIS — Z01812 Encounter for preprocedural laboratory examination: Secondary | ICD-10-CM | POA: Diagnosis present

## 2020-03-20 HISTORY — DX: Gastro-esophageal reflux disease without esophagitis: K21.9

## 2020-03-20 HISTORY — DX: Headache, unspecified: R51.9

## 2020-03-20 HISTORY — DX: Personal history of urinary calculi: Z87.442

## 2020-03-20 HISTORY — DX: Cardiac murmur, unspecified: R01.1

## 2020-03-20 HISTORY — DX: Acute embolism and thrombosis of unspecified deep veins of unspecified lower extremity: I82.409

## 2020-03-20 LAB — BASIC METABOLIC PANEL
Anion gap: 8 (ref 5–15)
BUN: 13 mg/dL (ref 8–23)
CO2: 30 mmol/L (ref 22–32)
Calcium: 9.6 mg/dL (ref 8.9–10.3)
Chloride: 106 mmol/L (ref 98–111)
Creatinine, Ser: 0.96 mg/dL (ref 0.61–1.24)
GFR calc Af Amer: >60 mL/min (ref >60)
GFR calc non Af Amer: >60 mL/min (ref >60)
Glucose, Bld: 113 mg/dL — ABNORMAL HIGH (ref 70–99)
Potassium: 4.7 mmol/L (ref 3.5–5.1)
Sodium: 144 mmol/L (ref 135–145)

## 2020-03-20 LAB — CBC
HCT: 49.5 % (ref 39.0–52.0)
Hemoglobin: 16.5 g/dL (ref 13.0–17.0)
MCH: 31.7 pg (ref 26.0–34.0)
MCHC: 33.3 g/dL (ref 30.0–36.0)
MCV: 95.2 fL (ref 80.0–100.0)
Platelets: 226 10*3/uL (ref 150–400)
RBC: 5.2 MIL/uL (ref 4.22–5.81)
RDW: 12.5 % (ref 11.5–15.5)
WBC: 5.9 10*3/uL (ref 4.0–10.5)
nRBC: 0 % (ref 0.0–0.2)

## 2020-03-20 LAB — SURGICAL PCR SCREEN
MRSA, PCR: NEGATIVE
Staphylococcus aureus: NEGATIVE

## 2020-03-20 LAB — SARS CORONAVIRUS 2 (TAT 6-24 HRS): SARS Coronavirus 2: NEGATIVE

## 2020-03-20 NOTE — Progress Notes (Signed)
PCP - Halina Andreas Cardiologist - christopher Schumann  Chest x-ray - na EKG - 03/11/20 Stress Test - na ECHO - 03/12/20 Cardiac Cath - na  Sleep Study - na   Blood Thinner Instructions:  na Aspirin Instructions:  ERAS Protcol -yes PRE-SURGERY Ensure given  COVID TEST- 03/20/20   Anesthesia review: cardiac hx.  Patient denies shortness of breath, fever, cough and chest pain at PAT appointment   All instructions explained to the patient, with a verbal understanding of the material. Patient agrees to go over the instructions while at home for a better understanding. Patient also instructed to self quarantine after being tested for COVID-19. The opportunity to ask questions was provided.

## 2020-03-24 ENCOUNTER — Other Ambulatory Visit: Payer: Self-pay

## 2020-03-24 ENCOUNTER — Inpatient Hospital Stay (HOSPITAL_COMMUNITY)
Admission: RE | Admit: 2020-03-24 | Discharge: 2020-04-02 | DRG: 462 | Disposition: A | Discharge status: Home Health | Payer: Medicare Other | Attending: Orthopaedic Surgery | Admitting: Orthopaedic Surgery

## 2020-03-24 ENCOUNTER — Encounter (HOSPITAL_COMMUNITY): Payer: Self-pay | Admitting: Orthopaedic Surgery

## 2020-03-24 ENCOUNTER — Inpatient Hospital Stay (HOSPITAL_COMMUNITY): Payer: Medicare Other | Admitting: Anesthesiology

## 2020-03-24 ENCOUNTER — Inpatient Hospital Stay (HOSPITAL_COMMUNITY): Payer: Medicare Other

## 2020-03-24 ENCOUNTER — Encounter (HOSPITAL_COMMUNITY)
Admission: RE | Disposition: A | Discharge status: Home Health | Payer: Self-pay | Source: Home / Self Care | Attending: Orthopaedic Surgery

## 2020-03-24 ENCOUNTER — Inpatient Hospital Stay (HOSPITAL_COMMUNITY): Payer: Medicare Other | Admitting: Physician Assistant

## 2020-03-24 DIAGNOSIS — M170 Bilateral primary osteoarthritis of knee: Secondary | ICD-10-CM | POA: Diagnosis present

## 2020-03-24 DIAGNOSIS — M1710 Unilateral primary osteoarthritis, unspecified knee: Secondary | ICD-10-CM | POA: Diagnosis present

## 2020-03-24 DIAGNOSIS — Z87442 Personal history of urinary calculi: Secondary | ICD-10-CM | POA: Diagnosis not present

## 2020-03-24 DIAGNOSIS — I083 Combined rheumatic disorders of mitral, aortic and tricuspid valves: Secondary | ICD-10-CM | POA: Diagnosis present

## 2020-03-24 DIAGNOSIS — Z8042 Family history of malignant neoplasm of prostate: Secondary | ICD-10-CM

## 2020-03-24 DIAGNOSIS — Z96653 Presence of artificial knee joint, bilateral: Secondary | ICD-10-CM

## 2020-03-24 DIAGNOSIS — M25462 Effusion, left knee: Secondary | ICD-10-CM | POA: Diagnosis present

## 2020-03-24 DIAGNOSIS — M25562 Pain in left knee: Secondary | ICD-10-CM | POA: Diagnosis present

## 2020-03-24 DIAGNOSIS — Z8049 Family history of malignant neoplasm of other genital organs: Secondary | ICD-10-CM | POA: Diagnosis not present

## 2020-03-24 DIAGNOSIS — K219 Gastro-esophageal reflux disease without esophagitis: Secondary | ICD-10-CM | POA: Diagnosis present

## 2020-03-24 DIAGNOSIS — J9811 Atelectasis: Secondary | ICD-10-CM | POA: Diagnosis not present

## 2020-03-24 DIAGNOSIS — R011 Cardiac murmur, unspecified: Secondary | ICD-10-CM | POA: Diagnosis present

## 2020-03-24 DIAGNOSIS — Z8546 Personal history of malignant neoplasm of prostate: Secondary | ICD-10-CM | POA: Diagnosis not present

## 2020-03-24 DIAGNOSIS — M25461 Effusion, right knee: Secondary | ICD-10-CM | POA: Diagnosis present

## 2020-03-24 DIAGNOSIS — R509 Fever, unspecified: Secondary | ICD-10-CM

## 2020-03-24 DIAGNOSIS — M179 Osteoarthritis of knee, unspecified: Secondary | ICD-10-CM | POA: Diagnosis present

## 2020-03-24 DIAGNOSIS — E559 Vitamin D deficiency, unspecified: Secondary | ICD-10-CM | POA: Diagnosis present

## 2020-03-24 HISTORY — PX: TOTAL KNEE ARTHROPLASTY: SHX125

## 2020-03-24 SURGERY — ARTHROPLASTY, KNEE, BILATERAL, TOTAL
Anesthesia: Spinal | Site: Knee | Laterality: Bilateral

## 2020-03-24 MED ORDER — DOCUSATE SODIUM 100 MG PO CAPS
100.0000 mg | ORAL_CAPSULE | Freq: Two times a day (BID) | ORAL | Status: DC
  Administered 2020-03-24 – 2020-04-02 (×17): 100 mg via ORAL
  Filled 2020-03-24 (×18): qty 1

## 2020-03-24 MED ORDER — ONDANSETRON HCL 4 MG/2ML IJ SOLN
INTRAMUSCULAR | Status: AC
  Filled 2020-03-24: qty 2

## 2020-03-24 MED ORDER — PHENYLEPHRINE HCL-NACL 10-0.9 MG/250ML-% IV SOLN
INTRAVENOUS | Status: DC | PRN
  Administered 2020-03-24: 20 ug/min via INTRAVENOUS

## 2020-03-24 MED ORDER — FENTANYL CITRATE (PF) 100 MCG/2ML IJ SOLN: 25.0000 ug | INTRAMUSCULAR | Status: DC | PRN

## 2020-03-24 MED ORDER — CEFAZOLIN SODIUM-DEXTROSE 2-4 GM/100ML-% IV SOLN
2.0000 g | INTRAVENOUS | Status: AC
  Administered 2020-03-24: 12:00:00 2 g via INTRAVENOUS
  Filled 2020-03-24: qty 100

## 2020-03-24 MED ORDER — DIPHENHYDRAMINE HCL 12.5 MG/5ML PO ELIX: 12.5000 mg | ORAL_SOLUTION | ORAL | Status: DC | PRN

## 2020-03-24 MED ORDER — ALUM & MAG HYDROXIDE-SIMETH 200-200-20 MG/5ML PO SUSP: 30.0000 mL | ORAL | Status: DC | PRN

## 2020-03-24 MED ORDER — FENTANYL CITRATE (PF) 100 MCG/2ML IJ SOLN: 100.0000 ug | Freq: Once | INTRAMUSCULAR | Status: AC

## 2020-03-24 MED ORDER — CELECOXIB 200 MG PO CAPS
200.0000 mg | ORAL_CAPSULE | Freq: Once | ORAL | Status: AC
  Administered 2020-03-24: 200 mg via ORAL
  Filled 2020-03-24: qty 1

## 2020-03-24 MED ORDER — LACTATED RINGERS IV SOLN: INTRAVENOUS | Status: DC

## 2020-03-24 MED ORDER — PROMETHAZINE HCL 25 MG/ML IJ SOLN: 6.2500 mg | INTRAMUSCULAR | Status: DC | PRN

## 2020-03-24 MED ORDER — GABAPENTIN 100 MG PO CAPS
100.0000 mg | ORAL_CAPSULE | Freq: Three times a day (TID) | ORAL | Status: DC
  Administered 2020-03-24 – 2020-04-02 (×25): 100 mg via ORAL
  Filled 2020-03-24 (×26): qty 1

## 2020-03-24 MED ORDER — VITAMIN B-12 1000 MCG PO TABS
1000.0000 ug | ORAL_TABLET | Freq: Every day | ORAL | Status: DC
  Administered 2020-03-25 – 2020-04-02 (×8): 1000 ug via ORAL
  Filled 2020-03-24 (×9): qty 1

## 2020-03-24 MED ORDER — ONDANSETRON HCL 4 MG/2ML IJ SOLN: 4.0000 mg | Freq: Four times a day (QID) | INTRAMUSCULAR | Status: DC | PRN

## 2020-03-24 MED ORDER — KETOROLAC TROMETHAMINE 15 MG/ML IJ SOLN
7.5000 mg | Freq: Four times a day (QID) | INTRAMUSCULAR | Status: AC
  Administered 2020-03-24 – 2020-03-25 (×4): 7.5 mg via INTRAVENOUS
  Filled 2020-03-24 (×4): qty 1

## 2020-03-24 MED ORDER — ACETAMINOPHEN 325 MG PO TABS
325.0000 mg | ORAL_TABLET | Freq: Four times a day (QID) | ORAL | Status: DC | PRN
  Administered 2020-03-26 – 2020-04-02 (×10): 650 mg via ORAL
  Filled 2020-03-24 (×9): qty 2

## 2020-03-24 MED ORDER — 0.9 % SODIUM CHLORIDE (POUR BTL) OPTIME
TOPICAL | Status: DC | PRN
  Administered 2020-03-24: 1000 mL

## 2020-03-24 MED ORDER — GLYCOPYRROLATE PF 0.2 MG/ML IJ SOSY
PREFILLED_SYRINGE | INTRAMUSCULAR | Status: DC | PRN
  Administered 2020-03-24: .2 mg via INTRAVENOUS

## 2020-03-24 MED ORDER — HYDROMORPHONE HCL 1 MG/ML IJ SOLN
0.5000 mg | INTRAMUSCULAR | Status: DC | PRN
  Administered 2020-03-24 – 2020-03-25 (×4): 1 mg via INTRAVENOUS
  Filled 2020-03-24 (×4): qty 1

## 2020-03-24 MED ORDER — ROPIVACAINE HCL 5 MG/ML IJ SOLN
INTRAMUSCULAR | Status: DC | PRN
  Administered 2020-03-24: 28 mL via EPIDURAL
  Administered 2020-03-24: 20 mL via EPIDURAL

## 2020-03-24 MED ORDER — METHOCARBAMOL 1000 MG/10ML IJ SOLN
500.0000 mg | Freq: Four times a day (QID) | INTRAVENOUS | Status: DC | PRN
  Filled 2020-03-24: qty 5

## 2020-03-24 MED ORDER — PROPOFOL 500 MG/50ML IV EMUL
INTRAVENOUS | Status: DC | PRN
  Administered 2020-03-24 (×2): 75 ug/kg/min via INTRAVENOUS

## 2020-03-24 MED ORDER — PANTOPRAZOLE SODIUM 40 MG PO TBEC
40.0000 mg | DELAYED_RELEASE_TABLET | Freq: Every day | ORAL | Status: DC
  Administered 2020-03-25 – 2020-04-02 (×9): 40 mg via ORAL
  Filled 2020-03-24 (×9): qty 1

## 2020-03-24 MED ORDER — MIDAZOLAM HCL 2 MG/2ML IJ SOLN: 2.0000 mg | Freq: Once | INTRAMUSCULAR | Status: AC

## 2020-03-24 MED ORDER — MENTHOL 3 MG MT LOZG: 1.0000 | LOZENGE | OROMUCOSAL | Status: DC | PRN

## 2020-03-24 MED ORDER — POLYETHYLENE GLYCOL 3350 17 G PO PACK
17.0000 g | PACK | Freq: Every day | ORAL | Status: DC | PRN
  Administered 2020-03-27 – 2020-03-31 (×2): 17 g via ORAL
  Filled 2020-03-24 (×2): qty 1

## 2020-03-24 MED ORDER — FENTANYL CITRATE (PF) 100 MCG/2ML IJ SOLN
INTRAMUSCULAR | Status: AC
  Filled 2020-03-24: qty 2

## 2020-03-24 MED ORDER — FENTANYL CITRATE (PF) 100 MCG/2ML IJ SOLN
INTRAMUSCULAR | Status: AC
  Administered 2020-03-24: 12:00:00 100 ug via INTRAVENOUS
  Filled 2020-03-24: qty 2

## 2020-03-24 MED ORDER — MIDAZOLAM HCL 2 MG/2ML IJ SOLN
INTRAMUSCULAR | Status: AC
  Filled 2020-03-24: qty 2

## 2020-03-24 MED ORDER — EPHEDRINE 5 MG/ML INJ
INTRAVENOUS | Status: AC
  Filled 2020-03-24: qty 10

## 2020-03-24 MED ORDER — EPHEDRINE SULFATE-NACL 50-0.9 MG/10ML-% IV SOSY
PREFILLED_SYRINGE | INTRAVENOUS | Status: DC | PRN
  Administered 2020-03-24 (×3): 5 mg via INTRAVENOUS

## 2020-03-24 MED ORDER — ONDANSETRON HCL 4 MG/2ML IJ SOLN
INTRAMUSCULAR | Status: DC | PRN
  Administered 2020-03-24: 4 mg via INTRAVENOUS

## 2020-03-24 MED ORDER — SODIUM CHLORIDE 0.9 % IV SOLN: INTRAVENOUS | Status: DC

## 2020-03-24 MED ORDER — METOCLOPRAMIDE HCL 5 MG PO TABS: 5.0000 mg | ORAL_TABLET | Freq: Three times a day (TID) | ORAL | Status: DC | PRN

## 2020-03-24 MED ORDER — CEFAZOLIN SODIUM-DEXTROSE 1-4 GM/50ML-% IV SOLN
1.0000 g | Freq: Four times a day (QID) | INTRAVENOUS | Status: AC
  Administered 2020-03-24 (×2): 1 g via INTRAVENOUS
  Filled 2020-03-24 (×2): qty 50

## 2020-03-24 MED ORDER — ONDANSETRON HCL 4 MG PO TABS
4.0000 mg | ORAL_TABLET | Freq: Four times a day (QID) | ORAL | Status: DC | PRN
  Administered 2020-03-24 – 2020-03-27 (×3): 4 mg via ORAL
  Filled 2020-03-24 (×3): qty 1

## 2020-03-24 MED ORDER — VITAMIN D 25 MCG (1000 UNIT) PO TABS
5000.0000 [IU] | ORAL_TABLET | Freq: Every day | ORAL | Status: DC
  Administered 2020-03-25 – 2020-04-02 (×8): 5000 [IU] via ORAL
  Filled 2020-03-24 (×9): qty 5

## 2020-03-24 MED ORDER — METOCLOPRAMIDE HCL 5 MG/ML IJ SOLN: 5.0000 mg | Freq: Three times a day (TID) | INTRAMUSCULAR | Status: DC | PRN

## 2020-03-24 MED ORDER — ACETAMINOPHEN 500 MG PO TABS
1000.0000 mg | ORAL_TABLET | Freq: Once | ORAL | Status: AC
  Administered 2020-03-24: 1000 mg via ORAL
  Filled 2020-03-24: qty 2

## 2020-03-24 MED ORDER — RIVAROXABAN 10 MG PO TABS
10.0000 mg | ORAL_TABLET | Freq: Every day | ORAL | Status: DC
  Administered 2020-03-25 – 2020-04-02 (×9): 10 mg via ORAL
  Filled 2020-03-24 (×9): qty 1

## 2020-03-24 MED ORDER — TRANEXAMIC ACID-NACL 1000-0.7 MG/100ML-% IV SOLN
1000.0000 mg | INTRAVENOUS | Status: AC
  Administered 2020-03-24: 1000 mg via INTRAVENOUS

## 2020-03-24 MED ORDER — METHOCARBAMOL 500 MG PO TABS
500.0000 mg | ORAL_TABLET | Freq: Four times a day (QID) | ORAL | Status: DC | PRN
  Administered 2020-03-24 – 2020-03-30 (×13): 500 mg via ORAL
  Filled 2020-03-24 (×13): qty 1

## 2020-03-24 MED ORDER — GLYCOPYRROLATE PF 0.2 MG/ML IJ SOSY
PREFILLED_SYRINGE | INTRAMUSCULAR | Status: AC
  Filled 2020-03-24: qty 1

## 2020-03-24 MED ORDER — MIDAZOLAM HCL 2 MG/2ML IJ SOLN
INTRAMUSCULAR | Status: AC
  Administered 2020-03-24: 12:00:00 2 mg via INTRAVENOUS
  Filled 2020-03-24: qty 2

## 2020-03-24 MED ORDER — PROPOFOL 10 MG/ML IV BOLUS
INTRAVENOUS | Status: DC | PRN
  Administered 2020-03-24: 20 mg via INTRAVENOUS
  Administered 2020-03-24: 30 mg via INTRAVENOUS

## 2020-03-24 MED ORDER — PHENYLEPHRINE 40 MCG/ML (10ML) SYRINGE FOR IV PUSH (FOR BLOOD PRESSURE SUPPORT)
PREFILLED_SYRINGE | INTRAVENOUS | Status: DC | PRN
  Administered 2020-03-24 (×2): 40 ug via INTRAVENOUS

## 2020-03-24 MED ORDER — OXYCODONE HCL 5 MG PO TABS
5.0000 mg | ORAL_TABLET | ORAL | Status: DC | PRN
  Administered 2020-03-26 – 2020-03-29 (×2): 10 mg via ORAL
  Administered 2020-03-29: 5 mg via ORAL
  Administered 2020-03-30 (×3): 10 mg via ORAL
  Administered 2020-03-31: 09:00:00 5 mg via ORAL
  Administered 2020-03-31 – 2020-04-01 (×3): 10 mg via ORAL
  Filled 2020-03-24 (×12): qty 2
  Filled 2020-03-24: qty 1
  Filled 2020-03-24: qty 2

## 2020-03-24 MED ORDER — PHENOL 1.4 % MT LIQD: 1.0000 | OROMUCOSAL | Status: DC | PRN

## 2020-03-24 MED ORDER — OXYCODONE HCL 5 MG PO TABS
10.0000 mg | ORAL_TABLET | ORAL | Status: DC | PRN
  Administered 2020-03-24 – 2020-03-25 (×5): 15 mg via ORAL
  Administered 2020-03-26 (×2): 10 mg via ORAL
  Administered 2020-03-28 (×2): 15 mg via ORAL
  Administered 2020-03-29: 18:00:00 10 mg via ORAL
  Administered 2020-04-02: 09:00:00 15 mg via ORAL
  Filled 2020-03-24: qty 2
  Filled 2020-03-24 (×8): qty 3

## 2020-03-24 MED ORDER — SODIUM CHLORIDE 0.9 % IR SOLN
Status: DC | PRN
  Administered 2020-03-24: 3000 mL

## 2020-03-24 SURGICAL SUPPLY — 75 items
BANDAGE ESMARK 6X9 LF (GAUZE/BANDAGES/DRESSINGS) ×1 IMPLANT
BLADE SAG 18X100X1.27 (BLADE) ×3 IMPLANT
BNDG COHESIVE 6X5 TAN STRL LF (GAUZE/BANDAGES/DRESSINGS) ×3 IMPLANT
BNDG ELASTIC 6X5.8 VLCR STR LF (GAUZE/BANDAGES/DRESSINGS) ×6 IMPLANT
BNDG ESMARK 6X9 LF (GAUZE/BANDAGES/DRESSINGS) ×3
BOWL SMART MIX CTS (DISPOSABLE) ×2 IMPLANT
CLOSURE WOUND 1/2 X4 (GAUZE/BANDAGES/DRESSINGS)
COVER SURGICAL LIGHT HANDLE (MISCELLANEOUS) ×3 IMPLANT
COVER WAND RF STERILE (DRAPES) ×1 IMPLANT
CUFF TOURN SGL QUICK 34 (TOURNIQUET CUFF) ×4
CUFF TRNQT CYL 34X4.125X (TOURNIQUET CUFF) ×2 IMPLANT
DRAPE EXTREMITY BILATERAL (DRAPES) ×3 IMPLANT
DRAPE HALF SHEET 40X57 (DRAPES) ×3 IMPLANT
DRAPE INCISE IOBAN 66X45 STRL (DRAPES) ×3 IMPLANT
DRAPE ORTHO SPLIT 77X108 STRL (DRAPES)
DRAPE SURG ORHT 6 SPLT 77X108 (DRAPES) ×1 IMPLANT
DRAPE U-SHAPE 47X51 STRL (DRAPES) ×6 IMPLANT
DRSG PAD ABDOMINAL 8X10 ST (GAUZE/BANDAGES/DRESSINGS) ×10 IMPLANT
DURAPREP 26ML APPLICATOR (WOUND CARE) ×6 IMPLANT
ELECT CAUTERY BLADE 6.4 (BLADE) ×3 IMPLANT
ELECT REM PT RETURN 9FT ADLT (ELECTROSURGICAL) ×3
ELECTRODE REM PT RTRN 9FT ADLT (ELECTROSURGICAL) ×1 IMPLANT
EVACUATOR 1/8 PVC DRAIN (DRAIN) IMPLANT
FACESHIELD WRAPAROUND (MASK) ×12 IMPLANT
FACESHIELD WRAPAROUND OR TEAM (MASK) ×2 IMPLANT
FEMORAL POSTERIOR SZ6 LFT (Femur) IMPLANT
FEMORAL POSTERIOR SZ6 RT (Femur) IMPLANT
GAUZE SPONGE 4X4 12PLY STRL (GAUZE/BANDAGES/DRESSINGS) ×6 IMPLANT
GAUZE XEROFORM 1X8 LF (GAUZE/BANDAGES/DRESSINGS) ×6 IMPLANT
GLOVE BIO SURGEON STRL SZ8 (GLOVE) ×7 IMPLANT
GLOVE BIOGEL PI IND STRL 8 (GLOVE) ×2 IMPLANT
GLOVE BIOGEL PI INDICATOR 8 (GLOVE) ×4
GLOVE ORTHO TXT STRL SZ7.5 (GLOVE) ×7 IMPLANT
GOWN STRL REUS W/ TWL LRG LVL3 (GOWN DISPOSABLE) ×1 IMPLANT
GOWN STRL REUS W/ TWL XL LVL3 (GOWN DISPOSABLE) ×2 IMPLANT
GOWN STRL REUS W/TWL LRG LVL3 (GOWN DISPOSABLE) ×2
GOWN STRL REUS W/TWL XL LVL3 (GOWN DISPOSABLE) ×4
HANDPIECE INTERPULSE COAX TIP (DISPOSABLE) ×2
IMMOBILIZER KNEE 22 UNIV (SOFTGOODS) ×6 IMPLANT
INSERT TIB PS SZ5 11 KNEE (Miscellaneous) ×4 IMPLANT
KIT BASIN OR (CUSTOM PROCEDURE TRAY) ×3 IMPLANT
KIT TURNOVER KIT B (KITS) ×3 IMPLANT
KNEE PATELLA ASYMMETRIC 10X32 (Knees) ×4 IMPLANT
KNEE TIBIAL COMPONENT SZ5 (Knees) ×4 IMPLANT
MANIFOLD NEPTUNE II (INSTRUMENTS) ×3 IMPLANT
NDL SPNL 18GX3.5 QUINCKE PK (NEEDLE) ×1 IMPLANT
NEEDLE SPNL 18GX3.5 QUINCKE PK (NEEDLE) ×3 IMPLANT
NS IRRIG 1000ML POUR BTL (IV SOLUTION) ×4 IMPLANT
PACK TOTAL JOINT (CUSTOM PROCEDURE TRAY) ×3 IMPLANT
PAD ARMBOARD 7.5X6 YLW CONV (MISCELLANEOUS) ×6 IMPLANT
PADDING CAST COTTON 6X4 STRL (CAST SUPPLIES) ×6 IMPLANT
PIN FLUTED HEDLESS FIX 3.5X1/8 (PIN) ×2 IMPLANT
POSTERIOR FEMORAL SZ6 LFT (Femur) ×3 IMPLANT
POSTERIOR FEMORAL SZ6 RT (Femur) ×3 IMPLANT
SET HNDPC FAN SPRY TIP SCT (DISPOSABLE) ×1 IMPLANT
SET PAD KNEE POSITIONER (MISCELLANEOUS) ×6 IMPLANT
SPONGE LAP 18X18 RF (DISPOSABLE) ×3 IMPLANT
STAPLER VISISTAT 35W (STAPLE) IMPLANT
STOCKINETTE IMPERVIOUS 9X36 MD (GAUZE/BANDAGES/DRESSINGS) ×3 IMPLANT
STRIP CLOSURE SKIN 1/2X4 (GAUZE/BANDAGES/DRESSINGS) ×4 IMPLANT
SUCTION FRAZIER HANDLE 10FR (MISCELLANEOUS) ×2
SUCTION TUBE FRAZIER 10FR DISP (MISCELLANEOUS) ×1 IMPLANT
SUT MNCRL AB 4-0 PS2 18 (SUTURE) IMPLANT
SUT VIC AB 0 CT1 27 (SUTURE) ×4
SUT VIC AB 0 CT1 27XBRD ANBCTR (SUTURE) IMPLANT
SUT VIC AB 1 CT1 27 (SUTURE) ×8
SUT VIC AB 1 CT1 27XBRD ANBCTR (SUTURE) ×4 IMPLANT
SUT VIC AB 2-0 CT1 27 (SUTURE) ×8
SUT VIC AB 2-0 CT1 TAPERPNT 27 (SUTURE) ×4 IMPLANT
TOWEL GREEN STERILE (TOWEL DISPOSABLE) ×6 IMPLANT
TRAY FOLEY W/BAG SLVR 16FR (SET/KITS/TRAYS/PACK) ×2
TRAY FOLEY W/BAG SLVR 16FR ST (SET/KITS/TRAYS/PACK) IMPLANT
WATER STERILE IRR 1000ML POUR (IV SOLUTION) ×3 IMPLANT
WRAP KNEE MAXI GEL POST OP (GAUZE/BANDAGES/DRESSINGS) ×4 IMPLANT
YANKAUER SUCT BULB TIP NO VENT (SUCTIONS) ×2 IMPLANT

## 2020-03-24 NOTE — Anesthesia Procedure Notes (Signed)
Spinal  Patient location during procedure: OR Start time: 03/24/2020 12:01 PM End time: 03/24/2020 12:12 PM Staffing Performed: anesthesiologist  Anesthesiologist: Duane Boston, MD Preanesthetic Checklist Completed: patient identified, IV checked, risks and benefits discussed, surgical consent, monitors and equipment checked, pre-op evaluation and timeout performed Spinal Block Patient position: sitting Prep: DuraPrep Patient monitoring: cardiac monitor, continuous pulse ox and blood pressure Approach: midline Location: L2-3 Injection technique: single-shot Needle Needle type: Pencan  Needle gauge: 24 G Needle length: 9 cm Additional Notes Functioning IV was confirmed and monitors were applied. Sterile prep and drape, including hand hygiene and sterile gloves were used. The patient was positioned and the spine was prepped. The skin was anesthetized with lidocaine.  Free flow of clear CSF was obtained prior to injecting local anesthetic into the CSF.  The spinal needle aspirated freely following injection.  The needle was carefully withdrawn.  The patient tolerated the procedure well.

## 2020-03-24 NOTE — Anesthesia Procedure Notes (Signed)
Anesthesia Regional Block: Adductor canal block   Pre-Anesthetic Checklist: ,, timeout performed, Correct Patient, Correct Site, Correct Laterality, Correct Procedure, Correct Position, site marked, Risks and benefits discussed,  Surgical consent,  Pre-op evaluation,  At surgeon's request and post-op pain management  Laterality: Left and Right  Prep: chloraprep       Needles:  Injection technique: Single-shot  Needle Type: Stimulator Needle - 80     Needle Length: 10cm  Needle Gauge: 21     Additional Needles:   Narrative:  Start time: 03/24/2020 11:38 AM End time: 03/24/2020 11:48 AM Injection made incrementally with aspirations every 5 mL.  Performed by: Personally

## 2020-03-24 NOTE — Brief Op Note (Signed)
03/24/2020  2:34 PM  PATIENT:  [name]  69 y.o. male  PRE-OPERATIVE DIAGNOSIS:  bilateral knee osteoarthritis  POST-OPERATIVE DIAGNOSIS:  bilateral knee osteoarthritis  PROCEDURE:  Procedure(s): TOTAL KNEE BILATERAL (Bilateral)  SURGEON:  Surgeon(s) and Role:    [name], MD - Primary  PHYSICIAN ASSISTANT: Benita Stabile, PA-C  ANESTHESIA:   regional and spinal  EBL:  100 mL   COUNTS:  YES  TOURNIQUET:   Total Tourniquet Time Documented: Thigh (Left) - 47 minutes Total: Thigh (Left) - 47 minutes  Thigh (Right) - 47 minutes Total: Thigh (Right) - 47 minutes   DICTATION: .Other Dictation: Dictation Number [id number]  PLAN OF CARE: Admit to inpatient   PATIENT DISPOSITION:  PACU - hemodynamically stable.   Delay start of Pharmacological VTE agent (>24hrs) due to surgical blood loss or risk of bleeding: no

## 2020-03-24 NOTE — Plan of Care (Signed)

## 2020-03-24 NOTE — Anesthesia Procedure Notes (Signed)
Procedure Name: MAC Date/Time: 03/24/2020 12:10 PM Performed by: Candis Shine, CRNA Pre-anesthesia Checklist: Patient identified, Emergency Drugs available, Suction available, Patient being monitored and Timeout performed Patient Re-evaluated:Patient Re-evaluated prior to induction Oxygen Delivery Method: Simple face mask Dental Injury: Teeth and Oropharynx as per pre-operative assessment

## 2020-03-24 NOTE — Progress Notes (Signed)
Orthopedic Tech Progress Note Patient Details:  Tristan Hamilton 12/06/1951 [id number]  CPM Left Knee CPM Left Knee: On Left Knee Flexion (Degrees): 0 Left Knee Extension (Degrees): 60 Additional Comments: added ice CPM Right Knee CPM Right Knee: On Right Knee Flexion (Degrees): 0 Right Knee Extension (Degrees): 60 Additional Comments: added ice  Post Interventions Patient Tolerated: Well Instructions Provided: [facility] [place] 03/24/2020, 4:06 PM

## 2020-03-24 NOTE — Anesthesia Postprocedure Evaluation (Signed)
Anesthesia Post Note  Patient: Tristan Hamilton  Procedure(s) Performed: TOTAL KNEE BILATERAL (Bilateral Knee)     Patient location during evaluation: PACU Anesthesia Type: Spinal Level of consciousness: awake and alert Pain management: pain level controlled Vital Signs Assessment: post-procedure vital signs reviewed and stable Respiratory status: spontaneous breathing and respiratory function stable Cardiovascular status: blood pressure returned to baseline and stable Postop Assessment: spinal receding Anesthetic complications: no    Last Vitals:  Vitals:   03/24/20 1550 03/24/20 1605  BP: 108/66 110/61  Pulse: 67 (!) 52  Resp: 16 15  Temp:  36.4 C  SpO2: 99% 97%    Last Pain:  Vitals:   03/24/20 1605  TempSrc:   PainSc: 0-No pain    LLE Motor Response: No movement due to regional block (03/24/20 1605) LLE Sensation: Decreased;Numbness;Tingling (03/24/20 1605) RLE Motor Response: No movement due to regional block (03/24/20 1605) RLE Sensation: Decreased;Numbness;Tingling (03/24/20 1605) L Sensory Level: L4-Anterior knee, lower leg(MDA aware; okay for pt to go to floor) (03/24/20 1605) R Sensory Level: L4-Anterior knee, lower leg(MDA aware; ok to go to floor) (03/24/20 1605)  Mekaela Azizi Purvis

## 2020-03-24 NOTE — Anesthesia Preprocedure Evaluation (Addendum)
Anesthesia Evaluation  Patient identified by MRN, date of birth, ID band Patient awake    Reviewed: Allergy & Precautions, NPO status , Patient's Chart, lab work & pertinent test results  History of Anesthesia Complications (+) PONV  Airway Mallampati: II  TM Distance: >3 FB Neck ROM: Full    Dental  (+) Partial Upper, Partial Lower, Dental Advisory Given   Pulmonary neg pulmonary ROS,    Pulmonary exam normal        Cardiovascular negative cardio ROS Normal cardiovascular exam  IMPRESSIONS     1. Left ventricular ejection fraction, by estimation, is 60 to 65%. The left ventricle has normal function. The left ventricle has no regional wall motion abnormalities. There is mild left ventricular hypertrophy. Left ventricular diastolic parameters  were normal. 2. Right ventricular systolic function is normal. The right ventricular size is normal. There is mildly elevated pulmonary artery systolic pressure. The estimated right ventricular systolic pressure is [id number] mmHg. 3. The mitral valve is normal in structure. Trivial mitral valve regurgitation. 4. The aortic valve is tricuspid. Aortic valve regurgitation is trivial. Mild aortic valve sclerosis is present, with no evidence of aortic valve stenosis. 5. The inferior vena cava is normal in size with <50% respiratory variability, suggesting right atrial pressure of 8 mmHg.    Neuro/Psych negative neurological ROS     GI/Hepatic Neg liver ROS, GERD  ,  Endo/Other  negative endocrine ROS  Renal/GU negative Renal ROS     Musculoskeletal negative musculoskeletal ROS (+)   Abdominal   Peds  Hematology negative hematology ROS (+)   Anesthesia Other Findings Day of surgery medications reviewed with the patient.  Reproductive/Obstetrics                            Anesthesia Physical Anesthesia Plan  ASA: II  Anesthesia Plan: Spinal   Post-op Pain  Management:  Regional for Post-op pain   Induction:   PONV Risk Score and Plan: Ondansetron and Propofol infusion  Airway Management Planned: Natural Airway  Additional Equipment:   Intra-op Plan:   Post-operative Plan:   Informed Consent: I have reviewed the patients History and Physical, chart, labs and discussed the procedure including the risks, benefits and alternatives for the proposed anesthesia with the patient or authorized representative who has indicated his/her understanding and acceptance.     Dental advisory given  Plan Discussed with: CRNA, Anesthesiologist and Surgeon  Anesthesia Plan Comments:        Anesthesia Quick Evaluation

## 2020-03-24 NOTE — Op Note (Signed)
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  PROCEDURE PERFORMED:  Bilateral total knee arthroplasties.  IMPLANTS:  [employer]ker Triathlon press-fit knee system for both knees with size 6 femurs, size 5, tibial trays, 11 mm fixed bearing polyethylene inserts, size 32 patella buttons.  SURGEON:  Lind Guest.  Ninfa Linden, MD  ASSISTANT:  Erskine Emery, PA-C  ANESTHESIA: 1.  Bilateral lower extremity adductor canal blocks. 2.  Spinal.  ANTIBIOTICS:  Two g of IV Ancef.  TOURNIQUET TIME:  45 minutes for left knee and 47 minutes for right knee.  ESTIMATED BLOOD LOSS:  100 mL.  COMPLICATIONS:  None.  INDICATIONS:  The patient is a 69 year old gentleman well known to me.  He has varus malalignment of both knees with severe end-stage arthritis, well documented with x-rays and clinical exam.  He is a very healthy individual and he has tried and failed  all forms of conservative treatment, including multiple steroid injections of both knees, activity modification, anti-inflammatories and time.  Given the failure of all these treatments and the fact that his bilateral knee pain is detrimentally affecting  his quality of life and his activities of daily living, and his mobility, he does wish to proceed with bilateral total knee arthroplasties.  I tried to talk him into just having 1 knee done at first, but he is adamant about having both knees done at  once.  He understands with bilateral knee surgery, there is such a heightened risk of acute blood loss anemia, nerve or vessel injury, fracture, infection and  especially DVT, and risk of implant failure.  He understands our goals are to decrease pain,  improve mobility and overall improve quality of life.  DESCRIPTION OF PROCEDURE:  After a long and thorough discussion about bilateral knee replacements was had and informed consent was obtained, both knees were marked.  Adductor canal blocks were obtained in the holding room in both lower extremities.  He  was then brought to the operating room and sat up on the operating table.  Spinal anesthesia was then obtained.  He was laid in the supine position on the operating table.  A Foley catheter was placed and a nonsterile tourniquet was placed around both  upper thighs on each leg.  Both the thighs, knees, legs, ankles and feet were prepped and draped with DuraPrep and sterile drapes including a sterile stockinette.  We decided to start with the left knee first, and per the patient's recommendations as  well and radiographic findings and pain.  Timeout was called and he was identified as correct patient, correct bilateral knees.  We then used an Esmarch to wrap the left leg and the tourniquet was inflated to 300 mm of pressure.  I then made a direct  midline incision over the patella and carried this proximally and distally.  We dissected down the knee joint and carried out a medial parapatellar arthrotomy, finding a very large joint effusion and significant arthritis involving his entire knee.  With  the knee in a flexed position, we removed the ACL, PCL, medial and lateral meniscus.  We used extramedullary cutting guide for making our proximal tibia cut, setting for correction of varus and valgus and a neutral slope and setting to take  9 mm off the  high side.  We made this cut without difficulty and then went to the intramedullary/intercondylar area of the distal femur for making our distal femoral cut.  This was with the intramedullary guide, setting our distal femoral cut at 10 mm for left knee  at 5 degrees  externally rotated.  We made this cut without difficulty and brought the knee back down to full extension and with a 9 mm extension block had achieved full extension.  We then went back to the femur and put our femoral sizing guide based off  the epicondylar axis and Whitesides line.  Based off this, we chose a size 6 femur.  We made our 4-in-1 cutting block for a size 6 femur and then we made our femoral box cut.  Attention was turned back to the tibia.  We chose a size 5 tibial tray for  coverage, setting the rotation off the tibial tubercle and the femur.  We made our keel punch off of this.  Of note, with the good quality of his bone, we did all this for a press-fit knee system.  Once we had the size 5 trial tibial tray trialed, we  trial the size 6 left femur and placed our 9 mm fixed bearing polyethylene insert.  We went up to 11 mm.  We were pleased with the 11 mm stability.  Of note, we did check our flexion gap as well before this.  We then made our patellar cut and drilled 3  holes for a size 32 press-fit patellar button.  We then removed all instrumentation from the knee and irrigated the knee with normal saline solution using pulsatile lavage.  We then dried the knee real well.  We did our finishing block on the tibia and  then placed our real press-fit size 5 tibia, followed by real size 6 press-fit left femur.  We placed our real 11 mm fixed bearing polyethylene insert and press-fit our size 32 patellar button.  I put the knee through several cycles of motion.  I was  pleased with stability and range of motion.  We then let the tourniquet down.  Hemostasis obtained with electrocautery.  We irrigated the knee again and then closed the arthrotomy with interrupted #1 Vicryl suture, followed by 0 Vicryl to close the deep  tissue, 2-0 Vicryl to close the subcutaneous tissue and interrupted staples to reapproximate the skin.  He was doing well during the case.  Anesthesia felt we could proceed to the  right knee.  We then used an Esmarch wrap on the right knee and tourniquet  was inflated to 300 mm of pressure.  We made a direct midline incision over the patella on the right side and carried this proximally and distally.  We  dissected down to the right knee joint and carried out a medial parapatellar arthrotomy, finding a  large joint effusion on the right knee just like the left knee.  We found the same severe disease of the right knee with varus malalignment.  We did all our same preparations were removing the ACL, PCL, medial and lateral meniscus and making our proximal  tibia cut using extramedullary cutting guide with the knee in a flexed position, taking 9 mm off the high side, and correcting varus and valgus and the neutral slope.  We then used an intramedullary guide for making our distal femoral cut for a right  knee at 5 degrees externally rotated for a 10 mm distal femoral  cut.  We then used our femoral sizing guide based off the epicondylar axis and chose a size 6 femur for the right side just like the left side.   We put in our 4-in-1 cutting block and made  our anterior and posterior cuts, followed by our chamfer cuts, followed by our femoral box cut for the tibia.  We made our keel punch for a size 5 tibial tray, appreciating its coverage of the tibial plateau and set the rotation based off the tibial  tubercle and the femur.  We then trialled our size 5 tibial tray, followed by our size 6 right femur and 11 mm fixed bearing polyethylene insert and we were pleased with range of motion and stability with that.  We also drilled 3 holes for a size 32  press-fit patellar button.  We then removed all trial instrumentation from the knee and irrigated the knee with normal saline solution.  We dried the knee real well and with the knee in a flexed position, placed our press-fit size 5 tibial tray, followed  by our press-fit size 6 right femur.  We placed our 11 mm fixed bearing polyethylene insert  and press-fit our patellar button.  Again, we were pleased with stability and range of motion.  The tourniquet was let down on this side and hemostasis obtained  with electrocautery.  We then closed the arthrotomy with interrupted #1 Vicryl suture, followed by 0 Vicryl to close the deep tissue, 2-0 Vicryl to close the subcutaneous tissue and interrupted staples to reapproximate the skin.  Xeroform well-padded  sterile dressing was applied on both knees.  He was taken to the recovery room in stable condition.  All final counts were correct.  There were no complications noted.  Of note, Benita Stabile, PA-C's assistance was greatly appreciated throughout every aspect  of this case and was well needed and documented.  VN/NUANCE  D:03/24/2020 T:03/24/2020 JOB:[id number]

## 2020-03-24 NOTE — Transfer of Care (Signed)
Immediate Anesthesia Transfer of Care Note  Patient: Tristan Hamilton  Procedure(s) Performed: TOTAL KNEE BILATERAL (Bilateral Knee)  Patient Location: PACU  Anesthesia Type:Spinal and MAC combined with regional for post-op pain  Level of Consciousness: awake, alert  and oriented  Airway & Oxygen Therapy: Patient Spontanous Breathing and Patient connected to face mask oxygen  Post-op Assessment: Report given to RN and Post -op Vital signs reviewed and stable  Post vital signs: Reviewed and stable  Last Vitals:  Vitals Value Taken Time  BP 93/56 03/24/20 1506  Temp    Pulse 71 03/24/20 1508  Resp 12 03/24/20 1508  SpO2 94 % 03/24/20 1508  Vitals shown include unvalidated device data.  Last Pain:  Vitals:   03/24/20 1150  TempSrc:   PainSc: 0-No pain         Complications: No apparent anesthesia complications

## 2020-03-24 NOTE — H&P (Signed)
TOTAL KNEE ADMISSION H&P  Patient is being admitted for bilaterally total knee arthroplasty.  Subjective:  Chief Complaint:bilaterally knee pain.  HPI: Tristan Hamilton, 69 y.o. male, has a history of pain and functional disability in the bilaterally knee due to arthritis and has failed non-surgical conservative treatments for greater than 12 weeks to includeNSAID's and/or analgesics, corticosteriod injections, viscosupplementation injections, flexibility and strengthening excercises, use of assistive devices, weight reduction as appropriate and activity modification.  Onset of symptoms was gradual, starting 5 years ago with gradually worsening course since that time. The patient noted no past surgery on the bilaterally knee(s).  Patient currently rates pain in the bilaterally knee(s) at 10 out of 10 with activity. Patient has night pain, worsening of pain with activity and weight bearing, pain that interferes with activities of daily living, pain with passive range of motion, crepitus and joint swelling.  Patient has evidence of subchondral sclerosis, periarticular osteophytes and joint space narrowing by imaging studies. There is no active infection.  Patient Active Problem List   Diagnosis Date Noted  . Acquired renal cyst of right kidney 01/21/2020  . Prostate cancer ([place]) 11/01/2019  . Enlarged prostate 11/01/2019  . Vitamin D deficiency 11/01/2019  . Unilateral primary osteoarthritis, left knee 10/21/2019  . Unilateral primary osteoarthritis, right knee 10/21/2019  . Bilateral primary osteoarthritis of knee 09/17/2019   Past Medical History:  Diagnosis Date  . DVT (deep venous thrombosis) ([facility])    left leg calf, 4 yrs. ago  . Enlarged prostate   . GERD (gastroesophageal reflux disease)   . Headache    h/o of ocular migranes  . Heart murmur   . History of kidney stones   . Osteoarthritis of both knees   . Prostate cancer [facility]) 2013   Physician in [place], had another bx. and was  negative    Past Surgical History:  Procedure Laterality Date  . CYSTOSCOPY WITH INSERTION OF UROLIFT    . FINGER SURGERY Right    middle finger- got stitches  . right knee scope  1975  . skiing accident     stitches in head  . TESTICLE REMOVAL Left    undescended  . TONSILLECTOMY    . VASECTOMY      Current Facility-Administered Medications  Medication Dose Route Frequency Provider Last Rate Last Admin  . ceFAZolin (ANCEF) IVPB 2g/100 mL premix  2 g Intravenous On Call to OR Dondra Prader R, NP      . fentaNYL (SUBLIMAZE) 100 MCG/2ML injection           . lactated ringers infusion   Intravenous Continuous Duane Boston, MD 10 mL/hr at 03/24/20 1030 New Bag at 03/24/20 1030  . midazolam (VERSED) 2 MG/2ML injection           . tranexamic acid (CYKLOKAPRON) IVPB 1,000 mg  1,000 mg Intravenous To OR Suzan Slick, NP       No Known Allergies  Social History   Tobacco Use  . Smoking status: Never Smoker  . Smokeless tobacco: Never Used  Substance Use Topics  . Alcohol use: Never    Family History  Problem Relation Age of Onset  . Diverticulitis Mother   . Penile cancer Father   . Healthy Sister   . Prostate cancer Maternal Grandfather      Review of Systems  Musculoskeletal: Positive for joint swelling.  All other systems reviewed and are negative.   Objective:  Physical Exam  Constitutional: He is oriented to person, place,  and time. He appears well-developed and well-nourished.  HENT:  Head: Normocephalic and atraumatic.  Eyes: Pupils are equal, round, and reactive to light. EOM are normal.  Cardiovascular: Normal rate.  Respiratory: Effort normal.  GI: Soft.  Musculoskeletal:     Cervical back: Normal range of motion and neck supple.     Right knee: Swelling, effusion and bony tenderness present. Decreased range of motion. Tenderness present over the medial joint line and lateral joint line. Abnormal alignment and abnormal meniscus.     Left knee: Swelling,  effusion and bony tenderness present. Decreased range of motion. Tenderness present over the medial joint line and lateral joint line. Abnormal alignment and abnormal meniscus.  Neurological: He is alert and oriented to person, place, and time.  Skin: Skin is warm and dry.  Psychiatric: He has a normal mood and affect.    Vital signs in last 24 hours: Temp:  [98.6 F (37 C)] 98.6 F (37 C) (04/06 1006) Pulse Rate:  [55] 55 (04/06 1006) Resp:  [18] 18 (04/06 1006) BP: (156)/(76) 156/76 (04/06 1006) SpO2:  [95 %] 95 % (04/06 1006) Weight:  [89.8 kg] 89.8 kg (04/06 1045)  Labs:   Estimated body mass index is 28.41 kg/m as calculated from the following:   Height as of this encounter: 5\' 10"  (1.778 m).   Weight as of this encounter: 89.8 kg.   Imaging Review Plain radiographs demonstrate severe degenerative joint disease of the bilaterally knee(s). The overall alignment ismild varus. The bone quality appears to be good for age and reported activity level.      Assessment/Plan:  End stage arthritis, bilaterally knee   The patient history, physical examination, clinical judgment of the provider and imaging studies are consistent with end stage degenerative joint disease of the bilaterally knee(s) and total knee arthroplasty is deemed medically necessary. The treatment options including medical management, injection therapy arthroscopy and arthroplasty were discussed at length. The risks and benefits of total knee arthroplasty were presented and reviewed. The risks due to aseptic loosening, infection, stiffness, patella tracking problems, thromboembolic complications and other imponderables were discussed. The patient acknowledged the explanation, agreed to proceed with the plan and consent was signed. Patient is being admitted for inpatient treatment for surgery, pain control, PT, OT, prophylactic antibiotics, VTE prophylaxis, progressive ambulation and ADL's and discharge planning. The  patient is planning to be discharged home with home health services    Anticipated LOS equal to or greater than 2 midnights due to - Age 43 and older with one or more of the following:  - Obesity  - Expected need for hospital services (PT, OT, Nursing) required for safe  discharge  - Anticipated need for postoperative skilled nursing care or inpatient rehab  - Active co-morbidities: None OR   - Unanticipated findings during/Post Surgery: None  - Patient is a high risk of re-admission due to: None

## 2020-03-25 ENCOUNTER — Encounter (HOSPITAL_COMMUNITY): Payer: Self-pay | Admitting: Orthopaedic Surgery

## 2020-03-25 LAB — BASIC METABOLIC PANEL
Anion gap: 8 (ref 5–15)
BUN: 14 mg/dL (ref 8–23)
CO2: 26 mmol/L (ref 22–32)
Calcium: 8.6 mg/dL — ABNORMAL LOW (ref 8.9–10.3)
Chloride: 108 mmol/L (ref 98–111)
Creatinine, Ser: 1.01 mg/dL (ref 0.61–1.24)
GFR calc Af Amer: >60 mL/min (ref >60)
GFR calc non Af Amer: >60 mL/min (ref >60)
Glucose, Bld: 145 mg/dL — ABNORMAL HIGH (ref 70–99)
Potassium: 5.2 mmol/L — ABNORMAL HIGH (ref 3.5–5.1)
Sodium: 142 mmol/L (ref 135–145)

## 2020-03-25 LAB — CBC
HCT: 41.9 % (ref 39.0–52.0)
Hemoglobin: 13.9 g/dL (ref 13.0–17.0)
MCH: 32.2 pg (ref 26.0–34.0)
MCHC: 33.2 g/dL (ref 30.0–36.0)
MCV: 97 fL (ref 80.0–100.0)
Platelets: 164 10*3/uL (ref 150–400)
RBC: 4.32 MIL/uL (ref 4.22–5.81)
RDW: 12.5 % (ref 11.5–15.5)
WBC: 9.7 10*3/uL (ref 4.0–10.5)
nRBC: 0 % (ref 0.0–0.2)

## 2020-03-25 MED ORDER — SODIUM CHLORIDE 0.9 % IV BOLUS
500.0000 mL | Freq: Once | INTRAVENOUS | Status: AC
  Administered 2020-03-25: 500 mL via INTRAVENOUS

## 2020-03-25 NOTE — Progress Notes (Signed)
03/25/20 1400  OT Visit Information  Last OT Received On 03/25/20  Assistance Needed +2  PT/OT/SLP Co-Evaluation/Treatment Yes  Reason for Co-Treatment Complexity of the patient's impairments (multi-system involvement);For patient/therapist safety;To address functional/ADL transfers  OT goals addressed during session ADL's and self-care  History of Present Illness s/p TOTAL KNEE BILATERAL. PMH includes: pain and functional disability in the bilaterally knee due to arthritis, Acquired renal cyst of right kidney, prostate cancer,and  DVT left leg calf 4 yrs. ago.  Precautions  Precautions Fall  Precaution Comments orthostatic  Restrictions  Weight Bearing Restrictions Yes  RLE Weight Bearing WBAT  LLE Weight Bearing WBAT  Home Living  Family/patient expects to be discharged to: Private residence  Living Arrangements Spouse/significant other  Available Help at Discharge Family  Type of [place] to enter  Entrance Stairs-Number of Steps 2 in garage, no rail. 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  Communication  Communication No difficulties  Pain Assessment  Pain Assessment 0-10  Pain Score 3  Pain Location back of R knee, left is good at rest  Pain Intervention(s) Limited activity within patient's tolerance;Monitored during session  Cognition  Arousal/Alertness Awake/alert  Behavior During Therapy WFL for tasks assessed/performed  Overall Cognitive Status Within Functional Limits for tasks assessed  Upper Extremity Assessment  Upper Extremity Assessment Overall WFL for tasks assessed  Lower Extremity Assessment  Lower Extremity Assessment Defer to PT evaluation   Cervical / Trunk Assessment  Cervical / Trunk Assessment Normal  ADL  Overall ADL's  Needs assistance/impaired  Eating/Feeding Set up;Sitting  Grooming Set up;Sitting  Upper Body Bathing Set up;Sitting  Lower Body Bathing Maximal assistance;Sitting/lateral leans  Upper Body Dressing  Set up;Sitting  Lower Body Dressing Maximal assistance;Sitting/lateral leans  General ADL Comments Pt completed bed mobility, sat EOB a few minutes at min guard level. Session limited by onset of dizziness and nausea.   Bed Mobility  Overal bed mobility Needs Assistance  Bed Mobility Supine to Sit;Sit to Supine  Supine to sit Mod assist;HOB elevated  Sit to supine Mod assist  General bed mobility comments Assist to unweight and advance BLE across bed, assist to advance BLE back onto bed. Pt able to use BUE pushing into mattress to clear hips during pivoting/powerup to EOB position.   Transfers  General transfer comment unable to assess due to onset of dizziness and nausea sitting EOB.  Balance  Overall balance assessment Needs assistance  Sitting-balance support No upper extremity supported;Feet supported  Sitting balance-Leahy Scale Fair  General Comments  General comments (skin integrity, edema, etc.) spouse present  OT - End of Session  Activity Tolerance Other (comment) (diziness and nausea once EOB)  Patient left in bed;with call bell/phone within reach;with family/visitor present  Nurse Communication Other (comment) (+dizzy and nausea, 88-89 O2 on RA)  CPM Left Knee  CPM Left Knee Off  CPM Right Knee  CPM Right Knee Off  OT Assessment  OT Recommendation/Assessment Patient needs continued OT Services  OT Visit Diagnosis Unsteadiness on feet (R26.81);Muscle weakness (generalized) (M62.81);Pain  OT Problem List Decreased activity tolerance;Impaired balance (sitting and/or standing);Decreased knowledge of use of DME or AE;Decreased knowledge of precautions;Pain  OT Plan  OT  Frequency (ACUTE  ONLY) Min 2X/week  OT Treatment/Interventions (ACUTE ONLY) Self-care/ADL training;DME and/or AE instruction;Therapeutic exercise;Therapeutic activities;Patient/family education;Balance training  AM-PAC OT "6 Clicks" Daily Activity Outcome Measure (Version 2)  Help from another person eating meals? 4  Help from another person taking care of personal grooming? 4  Help from another person toileting, which includes using toliet, bedpan, or urinal? 2  Help from another person bathing (including washing, rinsing, drying)? 2  Help from another person to put on and taking off regular upper body clothing? 3  Help from another person to put on and taking off regular lower body clothing? 2  6 Click Score 17  OT Recommendation  Recommendations for Other Services Rehab consult  Individuals Consulted  Consulted and Agree with Results and Recommendations Patient;Family member/caregiver  Family Member Consulted spouse  Acute Rehab OT Goals  Patient Stated Goal regain independence  OT Goal Formulation With patient/family  Time For Goal Achievement 04/08/20  Potential to Achieve Goals Good  OT Time Calculation  OT Start Time (ACUTE ONLY) 1059  OT Stop Time (ACUTE ONLY) 1140  OT Time Calculation (min) 41 min  OT General Charges  $OT Visit 1 Visit  OT Evaluation  $OT Eval Moderate Complexity 1 Mod  Written Expression  Dominant Hand Right    Pt admitted with the above diagnoses and presents with below problem list. Pt will benefit from continued acute OT to address the below listed deficits and maximize independence with basic ADLs prior to d/c to venue below. PTA pt was independent with ADLs, active, lives with his spouse. Pt very motivated to participate in OT/PT session but limited by onset of dizziness and nausea once EOB. Vitals assessed and entered into vitals flowsheet, suspect orthostatic hypotension. Pt reported resolution of symptoms once in supine. Feel once these symptoms improve pt will  progress well towards acute OT goals.   Tyrone Schimke, OT Acute Rehabilitation Services Pager: [phone] Office: [phone]

## 2020-03-25 NOTE — Progress Notes (Signed)
Notified Dr. Ninfa Linden about pt's bladder scan results. Bladder scan of 190ml. Pt has only voided once since foley was removed this morning, and it was a total of 200cc. Verbal order to increase NS to 11ml/hr.

## 2020-03-25 NOTE — Plan of Care (Signed)
  Problem: Pain Managment: Goal: General experience of comfort will improve Outcome: Progressing   

## 2020-03-25 NOTE — Evaluation (Signed)
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 Ninfa Linden. PMH includes: osteoarthritis, DVT, GERD, and prostate cancer.  Clinical Impression  Pt in bed upon arrival of PT/OT, agreeable to evaluation at this time. Prior to admission the pt was completely independent and just finished working on renovations to his daughters garage. The pt currently lives with his wife, and has 2 steps to enter their home. The pt now presents with limitations in functional mobility, activity tolerance, and static/dynamic stability due to above dx and resulting pain, and will continue to benefit from skilled PT to address these deficits. The pt was able to demo good bed mobility during today's session, with great functional strength in BUE, but mobility remained limited by symptomatic orthostatic hypotension upon sitting EOB. (BP sitting EOB 85/54 with HR 41 bpm; returned to 116/62 with HR of 53 and total resolution of sx once lying supine in bed). The pt will continue to benefit from skilled PT to progress functional mobility and independence with mobility prior to d/c home.      Follow Up Recommendations CIR;Supervision/Assistance - 24 hour    Equipment Recommendations  Other (comment)(defer to post acute (if home, RW and 3-in-1))    Recommendations for Other Services Rehab consult     Precautions / Restrictions Precautions Precautions: Fall Precaution Comments: orthostatic Restrictions Weight Bearing Restrictions: Yes RLE Weight Bearing: Weight bearing as tolerated LLE Weight Bearing: Weight bearing as tolerated      Mobility  Bed Mobility Overal bed mobility: Needs Assistance Bed Mobility: Supine to Sit     Supine to sit: Min assist Sit to supine: Min assist   General bed mobility comments: minA to move BLE to EOB, VCs for hand position initially,  but pt great UE strength for scooting. minA to return BLE to bed  Transfers Overall transfer level: (deferred at this point due to sig sx and orthostatic vitals (85/54 in sitting))               General transfer comment: unable to assess due to onset of dizziness and nausea sitting EOB.  Ambulation/Gait                Stairs            Wheelchair Mobility    Modified Rankin (Stroke Patients Only)       Balance Overall balance assessment: Needs assistance Sitting-balance support: No upper extremity supported;Feet supported Sitting balance-Leahy Scale: Good                                       Pertinent Vitals/Pain Pain Assessment: 0-10 Pain Score: 3  Pain Location: back of R knee, left is good at rest Pain Descriptors / Indicators: Grimacing;Sore Pain Intervention(s): Limited activity within patient's tolerance;Monitored during session;Repositioned    Home Living Family/patient expects to be discharged to:: Private residence Living Arrangements: Spouse/significant other Available Help at Discharge: Family Type of Home: House Home Access: Stairs to enter Entrance Stairs-Rails: Psychiatric nurse of Steps: 2 in garage, no rail. 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, frequently works in the yard     [place]: Right    Extremity/Trunk Assessment   Upper Extremity Assessment Upper Extremity Assessment: Overall WFL for tasks assessed    Lower Extremity Assessment Lower Extremity Assessment: RLE deficits/detail;LLE deficits/detail RLE Deficits / Details: able to complete minimal SLR, full evaluation limited due to pain LLE Deficits / Details: able to complete minimal  SLR, full evaluation limited due to pain    Cervical / Trunk Assessment Cervical / Trunk Assessment: Normal  Communication   Communication: No difficulties  Cognition Arousal/Alertness: Awake/alert Behavior During Therapy: WFL for tasks assessed/performed Overall Cognitive Status: Within Functional Limits for tasks assessed                                        General Comments General comments (skin integrity, edema, etc.): Pt became dizy, nauseous, and clammy sitting EOB; BP recorded as 84/54 (64). after return to supine BP was 116/62 with total resolution of sx.    Exercises     Assessment/Plan    PT Assessment Patient needs continued PT services  PT Problem List Decreased strength;Decreased mobility;Decreased range of motion;Decreased coordination;Decreased activity tolerance;Decreased balance;Pain       PT Treatment Interventions DME instruction;Therapeutic exercise;Gait training;Balance training;Stair training;Neuromuscular re-education;Functional mobility training;Therapeutic activities;Patient/family education    PT Goals (Current goals can be found in the Care Plan section)  Acute Rehab PT Goals Patient Stated Goal: return to independence PT Goal Formulation: With patient Time For Goal Achievement: 04/08/20 Potential to Achieve Goals: Good    Frequency 7X/week   Barriers to discharge        Co-evaluation PT/OT/SLP Co-Evaluation/Treatment: Yes Reason for Co-Treatment: Complexity of the patient's impairments (multi-system involvement);For patient/therapist safety;To address functional/ADL transfers PT goals addressed during session: Mobility/safety with mobility;Balance;Strengthening/ROM         AM-PAC PT "6 Clicks" Mobility  Outcome Measure Help needed turning from your back to your side while in a flat bed without using bedrails?: A Little Help needed moving from lying on your back to sitting on the side of a flat bed without using  bedrails?: A Little Help needed moving to and from a bed to a chair (including a wheelchair)?: A Lot Help needed standing up from a chair using your arms (e.g., wheelchair or bedside chair)?: A Lot Help needed to walk in hospital room?: A Little Help needed climbing 3-5 steps with a railing? : Total 6 Click Score: 14    End of Session   Activity Tolerance: Other (comment)(orthostatic) Patient left: in bed;with family/visitor present;with call bell/phone within reach;with bed alarm set Nurse Communication: Mobility status PT Visit Diagnosis: Difficulty in walking, not elsewhere classified (R26.2);Muscle weakness (generalized) (M62.81);Pain Pain - Right/Left: (bilateral) Pain - part of body: Knee    Time: 1057-1140 PT Time Calculation (min) (ACUTE ONLY): 43 min   Charges:   PT Evaluation $PT Eval Moderate Complexity: 1 Mod PT Treatments $Therapeutic Activity: 8-22 mins        Karma Ganja, PT, DPT   Acute Rehabilitation Department Pager #: [phone]  Otho Bellows 03/25/2020, 2:16 PM

## 2020-03-25 NOTE — Progress Notes (Signed)
Physical Therapy Treatment Patient Details Name: Tristan Hamilton MRN: [id number] DOB: 05/03/1951 Today's Date: 03/25/2020    History of Present Illness Pt is a 69 yo male s/p bilateral TKA 4/6 by Dr. Ninfa Linden. PMH includes: osteoarthritis, DVT, GERD, and prostate cancer.    PT Comments    Pt in bed upon arrival of PT, agreeable to session with focus on progressing mobility at this time. The pt was able to demo good ability with bed mobility, good use of BUE and improved movement of BLE to EOB. The pt was then able to sit EOB without assist, and BP remained stable. The pt was able to demo stand from significantly elevated bed with modA for power up and use of RW for BUE support/stability, and then was able to take 3 lateral steps in each direction with good management of BLE movement and RW. The pt then began expereincing significant increase in pain and upon sitting became symptomatic (dizzy, nausea, 1 episode of vomiting, clammy) and BP was recorded at 64/48 with a HR of 45 bpm. The pt was then returned to supine where his symptoms resolved and BP increased to 102/64. The RN was notified. The pt will continue to benefit from skilled PT to progress functional mobility, strength, and independence, as well as continued progression of activity tolerance.     Follow Up Recommendations  CIR;Supervision/Assistance - 24 hour     Equipment Recommendations  Other (comment)(defer to post acute (if home, RW and 3-in-1))    Recommendations for Other Services Rehab consult     Precautions / Restrictions Precautions Precautions: Fall Precaution Comments: orthostatic Restrictions Weight Bearing Restrictions: Yes RLE Weight Bearing: Weight bearing as tolerated LLE Weight Bearing: Weight bearing as tolerated    Mobility  Bed Mobility Overal bed mobility: Needs Assistance Bed Mobility: Supine to Sit     Supine to sit: Min assist Sit to supine: Min assist   General bed mobility comments: minA to  move BLE to EOB, VCs for hand position initially, but pt great UE strength for scooting. minA to return BLE to bed  Transfers Overall transfer level: Needs assistance Equipment used: Rolling walker (2 wheeled) Transfers: Sit to/from Stand Sit to Stand: Mod assist;From elevated surface         General transfer comment: pt able to complete sit-stand from significantly elevated bed with modA and RW. minG to steady once standing, pain-limited.  Ambulation/Gait Ambulation/Gait assistance: Min guard Gait Distance (Feet): 3 Feet Assistive device: Rolling walker (2 wheeled) Gait Pattern/deviations: Step-to pattern;Decreased stride length     General Gait Details: pt was able to take 3 small lateral steps in each direction with good use/movement of RW without assist.   Stairs             Wheelchair Mobility    Modified Rankin (Stroke Patients Only)       Balance Overall balance assessment: Needs assistance Sitting-balance support: No upper extremity supported;Feet supported Sitting balance-Leahy Scale: Good     Standing balance support: Bilateral upper extremity supported Standing balance-Leahy Scale: Poor Standing balance comment: reliant on BUE support on RW                            Cognition Arousal/Alertness: Awake/alert Behavior During Therapy: [facility] for tasks assessed/performed Overall Cognitive Status: Within Functional Limits for tasks assessed  Exercises      General Comments General comments (skin integrity, edema, etc.): Pt BP stable between supine and seated position, reported sig increase in pain with backwards steps when returning to sit, once seated pt c/o sig increase in sx, dizziness, nausea, one episode of vomiting, and claminess. BP dropped from 105/55 to 64/48. RN notified      Pertinent Vitals/Pain Pain Assessment: Faces Pain Score: 3  Faces Pain Scale: Hurts even more Pain  Location: B knee, R > L especially on posterior side Pain Descriptors / Indicators: Aching;Grimacing Pain Intervention(s): Limited activity within patient's tolerance;Monitored during session;Repositioned;Premedicated before session    Home Living Family/patient expects to be discharged to:: Private residence Living Arrangements: Spouse/significant other Available Help at Discharge: Family Type of Home: House Home Access: Stairs to enter Entrance Stairs-Rails: Right;Left Home Layout: One level Home Equipment: Civil engineer, contracting - built in Additional Comments: walking stick    Prior Function Level of Independence: Independent      Comments: 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, frequently works in the yard   PT Goals (current goals can now be found in the care plan section) Acute Rehab PT Goals Patient Stated Goal: return to independence PT Goal Formulation: With patient Time For Goal Achievement: 04/08/20 Potential to Achieve Goals: Good Progress towards PT goals: Progressing toward goals    Frequency    7X/week      PT Plan Current plan remains appropriate    Co-evaluation PT/OT/SLP Co-Evaluation/Treatment: Yes Reason for Co-Treatment: Complexity of the patient's impairments (multi-system involvement);For patient/therapist safety;To address functional/ADL transfers PT goals addressed during session: Mobility/safety with mobility;Balance;Proper use of DME;Strengthening/ROM OT goals addressed during session: ADL's and self-care      AM-PAC PT "6 Clicks" Mobility   Outcome Measure  Help needed turning from your back to your side while in a flat bed without using bedrails?: A Little Help needed moving from lying on your back to sitting on the side of a flat bed without using bedrails?: A Little Help needed moving to and from a bed to a chair (including a wheelchair)?: A Lot Help needed standing up from a chair using your arms (e.g., wheelchair or  bedside chair)?: A Lot Help needed to walk in hospital room?: A Little Help needed climbing 3-5 steps with a railing? : Total 6 Click Score: 14    End of Session Equipment Utilized During Treatment: Gait belt Activity Tolerance: Other (comment);Patient limited by pain(orthostatic) Patient left: in bed;with family/visitor present;with call bell/phone within reach;with bed alarm set Nurse Communication: Mobility status(orthostatics) PT Visit Diagnosis: Difficulty in walking, not elsewhere classified (R26.2);Muscle weakness (generalized) (M62.81);Pain Pain - Right/Left: (bilateral) Pain - part of body: Knee     Time: [id number] PT Time Calculation (min) (ACUTE ONLY): 31 min  Charges:  $Gait Training: 8-22 mins $Therapeutic Activity: 8-22 mins                     Karma Ganja, PT, DPT   Acute Rehabilitation Department Pager #: [phone]   Otho Bellows 03/25/2020, 2:32 PM

## 2020-03-25 NOTE — Progress Notes (Signed)
Subjective: 1 Day Post-Op Procedure(s) (LRB): TOTAL KNEE BILATERAL (Bilateral) Patient reports pain as moderate.  Tolerated surgery well.  H&H stable.  Objective: Vital signs in last 24 hours: Temp:  [97.4 F (36.3 C)-98.6 F (37 C)] 98.6 F (37 C) (04/07 0342) Pulse Rate:  [45-95] 53 (04/07 0342) Resp:  [11-18] 17 (04/07 0342) BP: (91-165)/(52-88) 106/52 (04/07 0342) SpO2:  [94 %-100 %] 95 % (04/07 0342) Weight:  [89.8 kg] 89.8 kg (04/06 1045)  Intake/Output from previous day: 04/06 0701 - 04/07 0700 In: 2422.1 [P.O.:680; I.V.:1642.1; IV Piggyback:100] Out: 1600 [Urine:1500; Blood:100] Intake/Output this shift: No intake/output data recorded.  Recent Labs    03/25/20 0215  HGB 13.9   Recent Labs    03/25/20 0215  WBC 9.7  RBC 4.32  HCT 41.9  PLT 164   Recent Labs    03/25/20 0215  NA 142  K 5.2*  CL 108  CO2 26  BUN 14  CREATININE 1.01  GLUCOSE 145*  CALCIUM 8.6*   No results for input(s): LABPT, INR in the last 72 hours.  Sensation intact distally Intact pulses distally Dorsiflexion/Plantar flexion intact Incision: dressing C/D/I Compartment soft   Assessment/Plan: 1 Day Post-Op Procedure(s) (LRB): TOTAL KNEE BILATERAL (Bilateral) Up with therapy      [name] 03/25/2020, 7:28 AM

## 2020-03-25 NOTE — Progress Notes (Signed)
Rehab Admissions Coordinator Note:  Patient was screened by Cleatrice Burke for appropriateness for an Inpatient Acute Rehab Consult per therapy recs.  At this time, we are recommending Inpatient Rehab consult. Dr. Ninfa Linden, please place order for consult if you would like patient to be considered for admit.  Cleatrice Burke RN MSN 03/25/2020, 2:52 PM  I can be reached at [phone].

## 2020-03-25 NOTE — Plan of Care (Signed)

## 2020-03-25 NOTE — Progress Notes (Signed)
Called Dr. Trevor Mace PA-C about pt's soft blood pressures while working with PT this afternoon. Pt had multiple soft blood pressures and felt nauseated while working with PT. Pt currently lying in bed and states he feels much better. Verbal order from PA-C to bolus 500cc of NS.

## 2020-03-25 NOTE — Progress Notes (Signed)
Occupational Therapy Treatment Patient Details Name: Tristan Hamilton MRN: [id number] DOB: [birth year] Today's Date: 03/25/2020    History of present illness Pt is a 69 yo male s/p bilateral TKA 4/6 by Dr. Ninfa Linden. PMH includes: osteoarthritis, DVT, GERD, and prostate cancer.   OT comments  Pt seen for followup OT session. Pt received standing EOB with PT. Pt reporting onset of dizziness and nausea.Completed stand>sit transfer and bed mobility.  Vitals assessed and continue to suspect orthostatic hypotension. Nursing notified. Of note pt O2 sat was 89 on RA. D/c plan remains appropriate.     Follow Up Recommendations  CIR    Equipment Recommendations  Other (comment)(defer to next venue)    Recommendations for Other Services Rehab consult    Precautions / Restrictions Precautions Precautions: Fall Precaution Comments: orthostatic Restrictions Weight Bearing Restrictions: Yes RLE Weight Bearing: Weight bearing as tolerated LLE Weight Bearing: Weight bearing as tolerated       Mobility Bed Mobility Overal bed mobility: Needs Assistance Bed Mobility: Sit to Supine     Supine to sit: Min assist Sit to supine: Mod assist   General bed mobility comments: assist to advance BLe onto bed  Transfers Overall transfer level: Needs assistance Equipment used: Rolling walker (2 wheeled) Transfers: Sit to/from Stand Sit to Stand: Mod assist;From elevated surface;+2 safety/equipment         General transfer comment: pt able to complete sit-stand from significantly elevated bed with modA and RW. minG to steady once standing, pain-limited.    Balance Overall balance assessment: Needs assistance Sitting-balance support: No upper extremity supported;Feet supported Sitting balance-Leahy Scale: Good     Standing balance support: Bilateral upper extremity supported Standing balance-Leahy Scale: Poor Standing balance comment: reliant on BUE support on RW                           ADL either performed or assessed with clinical judgement   ADL Overall ADL's : Needs assistance/impaired Eating/Feeding: Set up;Sitting   Grooming: Set up;Sitting   Upper Body Bathing: Set up;Sitting   Lower Body Bathing: Moderate assistance;+2 for physical assistance;Sit to/from stand   Upper Body Dressing : Set up;Sitting   Lower Body Dressing: Maximal assistance;+2 for physical assistance;Sit to/from stand                 General ADL Comments: Pt able to complete sit<>stand transfer before onset of dizziness and nausea terminated session.      Vision       Perception     Praxis      Cognition Arousal/Alertness: Awake/alert Behavior During Therapy: WFL for tasks assessed/performed Overall Cognitive Status: Within Functional Limits for tasks assessed                                          Exercises     Shoulder Instructions       General Comments Pt BP stable between supine and seated position, reported sig increase in pain with backwards steps when returning to sit, once seated pt c/o sig increase in sx, dizziness, nausea, one episode of vomiting, and claminess. BP dropped from 105/55 to 64/48. RN notified    Pertinent Vitals/ Pain       Pain Assessment: 0-10 Pain Score: 5  Faces Pain Scale: Hurts even more Pain Location: B knee, R > L especially on posterior side  Pain Descriptors / Indicators: Aching;Grimacing Pain Intervention(s): Monitored during session;Limited activity within patient's tolerance;Repositioned  Home Living Family/patient expects to be discharged to:: Private residence Living Arrangements: Spouse/significant other Available Help at Discharge: Family Type of Home: House Home Access: Stairs to enter [employer] of Steps: 2 in garage, no rail. 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, frequently works in the yard   [employer] 2X/week        Progress Toward Goals  OT Goals(current goals can now be found in the care plan section)  Progress towards OT goals: Progressing toward goals  Acute Rehab OT Goals Patient Stated Goal: return to independence OT Goal Formulation: With patient/family Time For Goal Achievement: 04/08/20 Potential to Achieve Goals: Good ADL Goals Pt Will Perform Lower Body Bathing: with min guard assist;sit to/from stand Pt Will Perform Lower Body Dressing: with min guard assist;sit to/from stand Pt Will Perform Toileting - Clothing Manipulation and hygiene: with min guard assist;sit to/from stand Pt Will Perform Tub/Shower Transfer: Shower transfer;with min guard assist;ambulating;shower seat;rolling walker Additional ADL Goal #1: Pt will complete bed mobility at supervision level to prepare for OOB ADLs.  Plan Discharge plan remains appropriate    Co-evaluation    PT/OT/SLP Co-Evaluation/Treatment: Yes Reason for Co-Treatment: Complexity of the patient's impairments (multi-system involvement);For patient/therapist safety;To address functional/ADL transfers PT goals addressed during session: Mobility/safety with mobility;Balance;Proper use of DME;Strengthening/ROM OT goals addressed during session: ADL's and self-care;Proper use of Adaptive equipment and DME      AM-PAC OT "6 Clicks" Daily Activity     Outcome Measure   Help from another person eating meals?: None Help from another person taking care of personal grooming?: None Help from another person toileting, which includes using toliet, bedpan, or urinal?: A Lot Help from another person bathing (including washing, rinsing,  drying)?: A Lot Help from another person to put on and taking off regular upper body clothing?: A Little Help from another person to put on and taking off regular lower body clothing?: A Lot 6 Click Score: 17    End of Session Equipment Utilized During Treatment: Gait belt;Rolling walker CPM Left Knee CPM Left Knee: Off CPM Right Knee CPM Right Knee: Off  OT Visit Diagnosis: Unsteadiness on feet (R26.81);Muscle weakness (generalized) (M62.81);Pain   Activity Tolerance Other (comment)(+ dizzy and nausea once in standing)   Patient Left in bed;with call bell/phone within reach   Nurse Communication Other (comment)(+dizzy/nausea)        Time: [id number] OT Time Calculation (min): 18 min  Charges: OT General Charges $OT Visit: 1 Visit  $Self Care/Home Management : 8-22 mins  Tyrone Schimke, OT Acute Rehabilitation Services Pager: [phone] Office: [phone]    Hortencia Pilar 03/25/2020, 2:40 PM

## 2020-03-26 LAB — BASIC METABOLIC PANEL
Anion gap: 7 (ref 5–15)
BUN: 12 mg/dL (ref 8–23)
CO2: 28 mmol/L (ref 22–32)
Calcium: 8.3 mg/dL — ABNORMAL LOW (ref 8.9–10.3)
Chloride: 100 mmol/L (ref 98–111)
Creatinine, Ser: 1.09 mg/dL (ref 0.61–1.24)
GFR calc Af Amer: >60 mL/min (ref >60)
GFR calc non Af Amer: >60 mL/min (ref >60)
Glucose, Bld: 132 mg/dL — ABNORMAL HIGH (ref 70–99)
Potassium: 4.2 mmol/L (ref 3.5–5.1)
Sodium: 135 mmol/L (ref 135–145)

## 2020-03-26 LAB — CBC
HCT: 34.4 % — ABNORMAL LOW (ref 39.0–52.0)
Hemoglobin: 11.6 g/dL — ABNORMAL LOW (ref 13.0–17.0)
MCH: 32 pg (ref 26.0–34.0)
MCHC: 33.7 g/dL (ref 30.0–36.0)
MCV: 95 fL (ref 80.0–100.0)
Platelets: 127 10*3/uL — ABNORMAL LOW (ref 150–400)
RBC: 3.62 MIL/uL — ABNORMAL LOW (ref 4.22–5.81)
RDW: 12.3 % (ref 11.5–15.5)
WBC: 11.8 10*3/uL — ABNORMAL HIGH (ref 4.0–10.5)
nRBC: 0 % (ref 0.0–0.2)

## 2020-03-26 NOTE — Progress Notes (Signed)
Patient ID: Tristan Hamilton, male   DOB: 02/19/1951, 69 y.o.   MRN: [id number] The patient looks much better today than he did yesterday.  He is making a little bit more urine.  His hemoglobin and hematocrit were normal yesterday.  We did order some labs this morning including of the med and a CBC and they are still pending.  His vital signs are stable.  His dressings on both knees are clean and dry.  He is able to flex and extend both feet.  We will continue to watch him clinically.  According to therapy notes he is an excellent candidate for inpatient rehab given the fact that he has bilateral total knee arthroplasties.  He is very motivated as well.  We will put in the order for a consultation with the inpatient rehab physicians.  We will also follow-up on labs today.

## 2020-03-26 NOTE — Progress Notes (Signed)
Patient ID: Tristan Hamilton, male   DOB: September 29, 1951, 69 y.o.   MRN: [id number] CBC and BMET show no signifcant issues and are basically stable.

## 2020-03-26 NOTE — Progress Notes (Signed)
Physical Therapy Treatment Patient Details Name: Tristan Hamilton MRN: [id number] DOB: 1951-12-10 Today's Date: 03/26/2020    History of Present Illness Pt is a 69 yo male s/p bilateral TKA 4/6 by Dr. Ninfa Linden. PMH includes: osteoarthritis, DVT, GERD, and prostate cancer.    PT Comments    Pt continues to require external assistance to mobilize.  He tolerate increased activity this pm,  Will continue to recommend aggressive rehab at CIR to maximize functional gains.    Follow Up Recommendations  CIR;Supervision/Assistance - 24 hour     Equipment Recommendations  Rolling walker with 5" wheels;3in1 (PT)    Recommendations for Other Services Rehab consult     Precautions / Restrictions Precautions Precautions: Fall Precaution Comments: orthostatic Restrictions Weight Bearing Restrictions: Yes RLE Weight Bearing: Weight bearing as tolerated LLE Weight Bearing: Weight bearing as tolerated    Mobility  Bed Mobility Overal bed mobility: Needs Assistance Bed Mobility: Supine to Sit;Sit to Supine     Supine to sit: Mod assist     General bed mobility comments: Mod assistance to advance LEs to edge of bed.  HOB elevated and able to use railings to  elevate trunk into seated position.  Transfers Overall transfer level: Needs assistance Equipment used: Rolling walker (2 wheeled) Transfers: Sit to/from Stand Sit to Stand: Mod assist;+2 physical assistance         General transfer comment: Pt required assistance to boost into standing from elevated bed.  +2 to boost into standing from elevate  bed. Cues for hand and foot placement to improve ease.  Ambulation/Gait Ambulation/Gait assistance: Min assist;+2 safety/equipment(for close chair follow.) Gait Distance (Feet): 30 Feet Assistive device: Rolling walker (2 wheeled) Gait Pattern/deviations: Step-to pattern;Decreased stride length     General Gait Details: Cues for sequencing, quad activation and RW safety.  Progressed gt  distance well.   Stairs             Wheelchair Mobility    Modified Rankin (Stroke Patients Only)       Balance Overall balance assessment: Needs assistance Sitting-balance support: No upper extremity supported;Feet supported Sitting balance-Leahy Scale: Good     Standing balance support: Bilateral upper extremity supported Standing balance-Leahy Scale: Poor                              Cognition Arousal/Alertness: Awake/alert Behavior During Therapy: WFL for tasks assessed/performed Overall Cognitive Status: Within Functional Limits for tasks assessed                                        Exercises Total Joint Exercises Ankle Circles/Pumps: AROM;Both;Supine;20 reps Quad Sets: AROM;Both;10 reps;Supine Heel Slides: AAROM;Both;10 reps;Supine Hip ABduction/ADduction: AAROM;Both;10 reps;Supine Straight Leg Raises: AAROM;Both;10 reps;Supine    General Comments        Pertinent Vitals/Pain Pain Assessment: 0-10 Pain Score: 6  Pain Location: B knee, R > L especially on posterior side Pain Descriptors / Indicators: Aching;Grimacing Pain Intervention(s): Monitored during session    Home Living                      Prior Function            PT Goals (current goals can now be found in the care plan section) Acute Rehab PT Goals Patient Stated Goal: return to independence Potential to Achieve Goals: Good  Progress towards PT goals: Progressing toward goals    Frequency    7X/week      PT Plan Current plan remains appropriate    Co-evaluation              AM-PAC PT "6 Clicks" Mobility   Outcome Measure  Help needed turning from your back to your side while in a flat bed without using bedrails?: A Lot Help needed moving from lying on your back to sitting on the side of a flat bed without using bedrails?: A Lot Help needed moving to and from a bed to a chair (including a wheelchair)?: A Lot Help needed  standing up from a chair using your arms (e.g., wheelchair or bedside chair)?: A Lot Help needed to walk in hospital room?: A Little Help needed climbing 3-5 steps with a railing? : A Lot 6 Click Score: 13    End of Session Equipment Utilized During Treatment: Gait belt Activity Tolerance: Patient tolerated treatment well Patient left: in bed;with family/visitor present;with call bell/phone within reach;with bed alarm set Nurse Communication: Mobility status PT Visit Diagnosis: Difficulty in walking, not elsewhere classified (R26.2);Muscle weakness (generalized) (M62.81);Pain Pain - Right/Left: (Bilateral) Pain - part of body: Knee     Time: [id number] PT Time Calculation (min) (ACUTE ONLY): 41 min  Charges:  $Gait Training: 8-22 mins $Therapeutic Exercise: 8-22 mins $Therapeutic Activity: 8-22 mins                     Tristan Hamilton , PTA Acute Rehabilitation Services Pager [phone] Office [phone]     Tristan Hamilton 03/26/2020, 5:37 PM

## 2020-03-26 NOTE — Progress Notes (Signed)
Physical Therapy Treatment Patient Details Name: Detavius Bloxsom MRN: [id number] DOB: 1951-05-20 Today's Date: 03/26/2020    History of Present Illness Pt is a 69 yo male s/p bilateral TKA 4/6 by Dr. Ninfa Linden. PMH includes: osteoarthritis, DVT, GERD, and prostate cancer.    PT Comments    Pt supine in bed on arrival.  He required assistance to move to edge of bed.  Pt slow and guarded due to knee pain.  Pt is progressing well and continues to benefit from aggressive rehab in a post acute setting.  Plan next session for progress with LE strengthening and ROM as well as gt progression.     Follow Up Recommendations  CIR;Supervision/Assistance - 24 hour     Equipment Recommendations  Rolling walker with 5" wheels;3in1 (PT)    Recommendations for Other Services Rehab consult     Precautions / Restrictions Precautions Precautions: Fall Precaution Comments: orthostatic Restrictions Weight Bearing Restrictions: Yes RLE Weight Bearing: Weight bearing as tolerated LLE Weight Bearing: Weight bearing as tolerated    Mobility  Bed Mobility Overal bed mobility: Needs Assistance Bed Mobility: Supine to Sit     Supine to sit: Mod assist     General bed mobility comments: Mod assistance to advance LEs to edge of bed.  HOB elevated and able to use railings to  elevate trunk into seated position.  Transfers Overall transfer level: Needs assistance Equipment used: Rolling walker (2 wheeled) Transfers: Sit to/from Stand Sit to Stand: Mod assist         General transfer comment: Pt required assistance to boost into standing from elevated bed.  Cues for hand and foot placement to improve ease.  Ambulation/Gait Ambulation/Gait assistance: Min assist;Mod assist(LOB x1 with B knee buckling) Gait Distance (Feet): 12 Feet Assistive device: Rolling walker (2 wheeled) Gait Pattern/deviations: Step-to pattern;Decreased stride length     General Gait Details: Cues for sequencing, quad  activation and RW safety.  Progress gt distance well.   Stairs             Wheelchair Mobility    Modified Rankin (Stroke Patients Only)       Balance Overall balance assessment: Needs assistance Sitting-balance support: No upper extremity supported;Feet supported Sitting balance-Leahy Scale: Good     Standing balance support: Bilateral upper extremity supported Standing balance-Leahy Scale: Poor                              Cognition Arousal/Alertness: Awake/alert Behavior During Therapy: WFL for tasks assessed/performed Overall Cognitive Status: Within Functional Limits for tasks assessed                                        Exercises Total Joint Exercises Ankle Circles/Pumps: AROM;Both;Supine;20 reps Quad Sets: AROM;Both;10 reps;Supine    General Comments        Pertinent Vitals/Pain Pain Assessment: 0-10 Faces Pain Scale: Hurts even more Pain Location: B knee, R > L especially on posterior side Pain Descriptors / Indicators: Aching;Grimacing Pain Intervention(s): Monitored during session;Repositioned    Home Living                      Prior Function            PT Goals (current goals can now be found in the care plan section) Acute Rehab PT Goals Patient Stated  Goal: return to independence Potential to Achieve Goals: Good Progress towards PT goals: Progressing toward goals    Frequency    7X/week      PT Plan Current plan remains appropriate    Co-evaluation              AM-PAC PT "6 Clicks" Mobility   Outcome Measure  Help needed turning from your back to your side while in a flat bed without using bedrails?: A Lot Help needed moving from lying on your back to sitting on the side of a flat bed without using bedrails?: A Lot Help needed moving to and from a bed to a chair (including a wheelchair)?: A Lot Help needed standing up from a chair using your arms (e.g., wheelchair or bedside  chair)?: A Lot Help needed to walk in hospital room?: A Little Help needed climbing 3-5 steps with a railing? : A Lot 6 Click Score: 13    End of Session Equipment Utilized During Treatment: Gait belt Activity Tolerance: Patient tolerated treatment well Patient left: in bed;with family/visitor present;with call bell/phone within reach;with bed alarm set Nurse Communication: Mobility status PT Visit Diagnosis: Difficulty in walking, not elsewhere classified (R26.2);Muscle weakness (generalized) (M62.81);Pain Pain - Right/Left: (Bilateral) Pain - part of body: Knee     Time: [id number] PT Time Calculation (min) (ACUTE ONLY): 39 min  Charges:  $Gait Training: 8-22 mins $Therapeutic Exercise: 8-22 mins $Therapeutic Activity: 8-22 mins                     Erasmo Leventhal , PTA Acute Rehabilitation Services Pager [phone] Office [phone]     Duey Liller [name] 03/26/2020, 12:07 PM

## 2020-03-26 NOTE — Progress Notes (Signed)
Inpatient Rehab Admissions:  Inpatient Rehab Consult received.  I met with patient at the bedside for rehabilitation assessment and to discuss goals and expectations of an inpatient rehab admission.  He is open to CIR and appears to be an excellent candidate.  I will need insurance authorization in order to admit, which I will open for today.  Signed: Shann Medal, PT, DPT Admissions Coordinator [phone] 03/26/20  10:48 AM

## 2020-03-27 MED ORDER — RIVAROXABAN 10 MG PO TABS: 10.0000 mg | ORAL_TABLET | Freq: Every day | ORAL | 0 refills | Status: DC

## 2020-03-27 MED ORDER — TRAMADOL HCL 50 MG PO TABS
50.0000 mg | ORAL_TABLET | Freq: Four times a day (QID) | ORAL | Status: DC | PRN
  Administered 2020-04-01: 19:00:00 100 mg via ORAL
  Filled 2020-03-27: qty 2
  Filled 2020-03-27: qty 1
  Filled 2020-03-27: qty 2

## 2020-03-27 MED ORDER — METHOCARBAMOL 500 MG PO TABS: 500.0000 mg | ORAL_TABLET | Freq: Four times a day (QID) | ORAL | 0 refills | Status: DC | PRN

## 2020-03-27 MED ORDER — OXYCODONE HCL 5 MG PO TABS: 5.0000 mg | ORAL_TABLET | ORAL | 0 refills | Status: DC | PRN

## 2020-03-27 NOTE — Progress Notes (Signed)
Inpatient Rehab Admissions Coordinator:   I haven't heard from insurance yet.  Will f/u with pt for possible admission early next week pending bed availability and insurance authorization.   Shann Medal, PT, DPT Admissions Coordinator [phone] 03/27/20  3:07 PM

## 2020-03-27 NOTE — Progress Notes (Signed)
Pt c/o nausea this morning, with relief from Zofran. He is c/o cannot stay awake today, feeling groggy. Avoided narcotics today.  Pt was still able to ambulate to the hall with PT, Tylenol was given for pain and muscle relaxer. Dr Ninfa Linden notified.  Ordered a milder pain meds.

## 2020-03-27 NOTE — Discharge Summary (Signed)
Patient ID: Tristan Hamilton MRN: [id number] DOB/AGE: 69/16/1952 69 y.o.  Admit date: 03/24/2020 Discharge date: 03/27/2020  Admission Diagnoses:  Principal Problem:   Bilateral primary osteoarthritis of knee Active Problems:   OA (osteoarthritis) of knee   Status post total bilateral knee replacement   Discharge Diagnoses:  Same  Past Medical History:  Diagnosis Date  . DVT (deep venous thrombosis) (HCC)    left leg calf, 4 yrs. ago  . Enlarged prostate   . GERD (gastroesophageal reflux disease)   . Headache    h/o of ocular migranes  . Heart murmur   . History of kidney stones   . Osteoarthritis of both knees   . Prostate cancer [facility]) 2013   Physician in [place], had another bx. and was negative    Surgeries: Procedure(s): TOTAL KNEE BILATERAL on 03/24/2020   Consultants:   Discharged Condition: Improved  Hospital Course: Tristan Hamilton is an 69 y.o. male who was admitted 03/24/2020 for operative treatment ofBilateral primary osteoarthritis of knee. Patient has severe unremitting pain that affects sleep, daily activities, and work/hobbies. After pre-op clearance the patient was taken to the operating room on 03/24/2020 and underwent  Procedure(s): TOTAL KNEE BILATERAL.    Patient was given perioperative antibiotics:  Anti-infectives (From admission, onward)   Start     Dose/Rate Route Frequency Ordered Stop   03/24/20 1815  ceFAZolin (ANCEF) IVPB 1 g/50 mL premix     1 g 100 mL/hr over 30 Minutes Intravenous Every 6 hours 03/24/20 1703 03/24/20 2350   03/24/20 1015  ceFAZolin (ANCEF) IVPB 2g/100 mL premix     2 g 200 mL/hr over 30 Minutes Intravenous On call to O.R. 03/24/20 1001 03/24/20 1212       Patient was given sequential compression devices, early ambulation, and chemoprophylaxis to prevent DVT.  Patient benefited maximally from hospital stay and there were no complications.    Recent vital signs:  Patient Vitals for the past 24 hrs:  BP Temp Temp src Pulse Resp  SpO2  03/27/20 0328 108/63 99.9 F (37.7 C) Oral 98 18 91 %  03/26/20 2059 (!) 156/81 99.9 F (37.7 C) Oral 99 18 91 %  03/26/20 0900 136/90 98.1 F (36.7 C) Oral 66 17 100 %  03/26/20 0732 128/72 100 F (37.8 C) Oral 89 15 (!) 88 %     Recent laboratory studies:  Recent Labs    03/25/20 0215 03/25/20 0215 03/26/20 0744  WBC 9.7  --  11.8*  HGB 13.9  --  11.6*  HCT 41.9  --  34.4*  PLT 164  --  127*  NA 142  --  135  K 5.2*  --  4.2  CL 108  --  100  CO2 26  --  28  BUN 14  --  12  CREATININE 1.01  --  1.09  GLUCOSE 145*  --  132*  CALCIUM 8.6*   < > 8.3*   < > = values in this interval not displayed.     Discharge Medications:   Allergies as of 03/27/2020   No Known Allergies     Medication List    STOP taking these medications   naproxen sodium 220 MG tablet Commonly known as: ALEVE     TAKE these medications   acetaminophen 500 MG tablet Commonly known as: TYLENOL Take 500-1,000 mg by mouth every 6 (six) hours as needed for mild pain.   Cholecalciferol 125 MCG (5000 UT) capsule Take 5,000 Units by mouth  daily.   methocarbamol 500 MG tablet Commonly known as: ROBAXIN Take 1 tablet (500 mg total) by mouth every 6 (six) hours as needed for muscle spasms.   oxyCODONE 5 MG immediate release tablet Commonly known as: Oxy IR/ROXICODONE Take 1-2 tablets (5-10 mg total) by mouth every 4 (four) hours as needed for moderate pain (pain score 4-6).   rivaroxaban 10 MG Tabs tablet Commonly known as: XARELTO Take 1 tablet (10 mg total) by mouth daily with breakfast.   vitamin B-12 1000 MCG tablet Commonly known as: CYANOCOBALAMIN Take 1,000 mcg by mouth daily.            Durable Medical Equipment  (From admission, onward)         Start     Ordered   03/24/20 1704  DME 3 n 1  Once     03/24/20 1703   03/24/20 1704  DME Walker rolling  Once    Question Answer Comment  Walker: With 5 Inch Wheels   Patient needs a walker to treat with the following  condition Status post total bilateral knee replacement      03/24/20 1703          Diagnostic Studies: DG Knee Left Port  Result Date: 03/24/2020 CLINICAL DATA:  Total knee replacement. EXAM: PORTABLE LEFT KNEE - 1-2 VIEW COMPARISON:  Left knee MRI 10/06/2019.  Left knee series 09/17/2019. FINDINGS: Total left knee replacement. Hardware intact. Anatomic alignment. No acute bony abnormality. IMPRESSION: Total left knee replacement with anatomic alignment. Electronically Signed   By: Marcello Moores  Register   On: 03/24/2020 16:08   DG Knee Right Port  Result Date: 03/24/2020 CLINICAL DATA:  69 year old male status post total bilateral knee replacement. EXAM: PORTABLE RIGHT KNEE - 1-2 VIEW COMPARISON:  Right knee radiograph dated 09/17/2019. FINDINGS: There is a total right knee arthroplasty. The arthroplasty components appear intact and in anatomic alignment. There is no acute fracture or dislocation. Postsurgical changes including air within the joint space and anterior cutaneous clips. IMPRESSION: Total right knee arthroplasty.  No immediate complication. Electronically Signed   By: Anner Crete M.D.   On: 03/24/2020 16:07   ECHOCARDIOGRAM COMPLETE  Result Date: 03/13/2020    ECHOCARDIOGRAM REPORT   Patient Name:   Tristan Hamilton Date of Exam: 03/12/2020 Medical Rec #:  [id number]      Height:       70.0 in Accession #:    [id number]     Weight:       199.2 lb Date of Birth:  10/05/1951       BSA:          2.084 m Patient Age:    71 years       BP:           146/83 mmHg Patient Gender: M              HR:           58 bpm. Exam Location:  Outpatient Procedure: 2D Echo, 3D Echo, Cardiac Doppler, Color Doppler and Strain Analysis Indications:    R00.1 Bradycardia, unspecified; Z01.818 Encounter for other                 preprocedural examination  History:        Patient has no prior history of Echocardiogram examinations.                 Abnormal ECG and Palpitations. Cancer.  Sonographer:    Roseanna Rainbow  Referring Phys: [id number] [place]  1. Left ventricular ejection fraction, by estimation, is 60 to 65%. The left ventricle has normal function. The left ventricle has no regional wall motion abnormalities. There is mild left ventricular hypertrophy. Left ventricular diastolic parameters were normal.  2. Right ventricular systolic function is normal. The right ventricular size is normal. There is mildly elevated pulmonary artery systolic pressure. The estimated right ventricular systolic pressure is Q000111Q mmHg.  3. The mitral valve is normal in structure. Trivial mitral valve regurgitation.  4. The aortic valve is tricuspid. Aortic valve regurgitation is trivial. Mild aortic valve sclerosis is present, with no evidence of aortic valve stenosis.  5. The inferior vena cava is normal in size with <50% respiratory variability, suggesting right atrial pressure of 8 mmHg. FINDINGS  Left Ventricle: Left ventricular ejection fraction, by estimation, is 60 to 65%. The left ventricle has normal function. The left ventricle has no regional wall motion abnormalities. The left ventricular internal cavity size was normal in size. There is  mild left ventricular hypertrophy. Left ventricular diastolic parameters were normal. Right Ventricle: The right ventricular size is normal. No increase in right ventricular wall thickness. Right ventricular systolic function is normal. There is mildly elevated pulmonary artery systolic pressure. The tricuspid regurgitant velocity is 2.62  m/s, and with an assumed right atrial pressure of 8 mmHg, the estimated right ventricular systolic pressure is Q000111Q mmHg. Left Atrium: Left atrial size was normal in size. Right Atrium: Right atrial size was normal in size. Pericardium: There is no evidence of pericardial effusion. Mitral Valve: The mitral valve is normal in structure. Trivial mitral valve regurgitation. Tricuspid Valve: The tricuspid valve is normal in structure.  Tricuspid valve regurgitation is trivial. Aortic Valve: The aortic valve is tricuspid. Aortic valve regurgitation is trivial. Mild aortic valve sclerosis is present, with no evidence of aortic valve stenosis. Pulmonic Valve: The pulmonic valve was not well visualized. Pulmonic valve regurgitation is not visualized. Aorta: The aortic root and ascending aorta are structurally normal, with no evidence of dilitation. Venous: The inferior vena cava is normal in size with less than 50% respiratory variability, suggesting right atrial pressure of 8 mmHg. IAS/Shunts: No atrial level shunt detected by color flow Doppler.  LEFT VENTRICLE PLAX 2D LVIDd:         4.60 cm     Diastology LVIDs:         2.80 cm     LV e' lateral:   10.30 cm/s LV PW:         1.30 cm     LV E/e' lateral: 6.8 LV IVS:        1.30 cm     LV e' medial:    9.79 cm/s LVOT diam:     1.90 cm     LV E/e' medial:  7.2 LV SV:         66 LV SV Index:   32 LVOT Area:     2.84 cm  LV Volumes (MOD) LV vol d, MOD A2C: 40.6 ml LV vol d, MOD A4C: 84.2 ml LV vol s, MOD A2C: 32.4 ml LV vol s, MOD A4C: 36.6 ml LV SV MOD A2C:     8.2 ml LV SV MOD A4C:     84.2 ml LV SV MOD BP:      11.7 ml RIGHT VENTRICLE             IVC RV S prime:     13.20 cm/s  IVC diam: 1.90 cm TAPSE (M-mode): 2.5 cm LEFT ATRIUM             Index       RIGHT ATRIUM           Index LA diam:        4.00 cm 1.92 cm/m  RA Area:     17.80 cm LA Vol (A2C):   66.8 ml 32.06 ml/m RA Volume:   49.30 ml  23.66 ml/m LA Vol (A4C):   48.3 ml 23.18 ml/m LA Biplane Vol: 56.2 ml 26.97 ml/m  AORTIC VALVE LVOT Vmax:   122.00 cm/s LVOT Vmean:  71.800 cm/s LVOT VTI:    0.233 m  AORTA Ao Root diam: 3.20 cm Ao Asc diam:  3.50 cm MITRAL VALVE               TRICUSPID VALVE MV Area (PHT): 2.22 cm    TR Peak grad:   27.5 mmHg MV Decel Time: 342 msec    TR Vmax:        262.00 cm/s MV E velocity: 70.30 cm/s MV A velocity: 55.30 cm/s  SHUNTS MV E/A ratio:  1.27        Systemic VTI:  0.23 m                             Systemic Diam: 1.90 cm Oswaldo Milian MD Electronically signed by Oswaldo Milian MD Signature Date/Time: 03/13/2020/1:28:00 AM    Final     Disposition: Discharge disposition: 62-Rehab Facility         Follow-up Information    [name], MD Follow up in 2 week(s).   Specialty: Orthopedic Surgery Contact information: [address] [place] [address] [phone]            Signed: [name] 03/27/2020, 7:15 AM

## 2020-03-27 NOTE — Progress Notes (Signed)
Patient ID: Tristan Hamilton, male   DOB: 1951/07/06, 69 y.o.   MRN: [id number] The patient is making good progress status post bilateral total knee arthroplasties.  His vital signs are stable.  He has good urine output.  Both knees are stable.  His dressings are clean and dry.  Both incisions look good.  His calves are soft and he has intact motor and sensory function in both lower extremities.  Therapy has recommended inpatient rehab.  He is currently being evaluated by the rehab coordinator.  From a surgical standpoint he is cleared for discharge to inpatient rehab today if possible.  I will go ahead and put in discharge orders and a discharge summary.  If this does change and we need to keep him here as an inpatient admission, nursing will let us know.

## 2020-03-27 NOTE — Care Management Important Message (Signed)
Important Message  Patient Details  Name: [name] MRN: [id number] Date of Birth: [birth year]   Medicare Important Message Given:  Yes     Ramsey Guadamuz 03/27/2020, 3:21 PM

## 2020-03-27 NOTE — Progress Notes (Signed)
Patient ID: Tristan Hamilton, male   DOB: [birth year], 69 y.o.   MRN: [id number] Since the patient has not been accepted for inpatient rehab today, I will need to keep him in the hospital because he is not safe for discharge to home and hopefully can be considered for inpatient rehab in the next few days.

## 2020-03-27 NOTE — Progress Notes (Signed)
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 Ninfa Linden. PMH includes: osteoarthritis, DVT, GERD, and prostate cancer.    PT Comments    Pt seated in recliner on arrival,  Performed gt training and LE strengthening this session.  Pt reports feeling constipated.  Continue to recommend aggressive CIR therapies to improve strength and function before returning home with support from his wife.     Follow Up Recommendations  CIR;Supervision/Assistance - 24 hour     Equipment Recommendations  Rolling walker with 5" wheels;3in1 (PT)    Recommendations for Other Services Rehab consult     Precautions / Restrictions Precautions Precautions: Fall Precaution Comments: orthostatic Restrictions Weight Bearing Restrictions: Yes RLE Weight Bearing: Weight bearing as tolerated LLE Weight Bearing: Weight bearing as tolerated    Mobility  Bed Mobility               General bed mobility comments: Pt seated in recliner on arrival.  Transfers Overall transfer level: Needs assistance Equipment used: Rolling walker (2 wheeled) Transfers: Sit to/from Stand Sit to Stand: Min assist         General transfer comment: Pt required assistance for boosting into standing.  Cues for forward weight shifting and foot/hand placement.  Ambulation/Gait Ambulation/Gait assistance: Min assist;+2 safety/equipment Gait Distance (Feet): 45 Feet Assistive device: Rolling walker (2 wheeled) Gait Pattern/deviations: Step-to pattern;Decreased stride length;Trunk flexed     General Gait Details: Cues for sequencing, quad activation and RW safety.  No buckling this session but does fatigue with activity.   Stairs             Wheelchair Mobility    Modified Rankin (Stroke Patients Only)       Balance Overall balance assessment: Needs  assistance Sitting-balance support: No upper extremity supported;Feet supported Sitting balance-Leahy Scale: Good       Standing balance-Leahy Scale: Poor                              Cognition Arousal/Alertness: Awake/alert Behavior During Therapy: WFL for tasks assessed/performed Overall Cognitive Status: Within Functional Limits for tasks assessed                                        Exercises Total Joint Exercises Ankle Circles/Pumps: AROM;Both;Supine;20 reps Quad Sets: AROM;Both;10 reps;Supine Heel Slides: AAROM;Both;10 reps;Supine Hip ABduction/ADduction: AAROM;Both;10 reps;Supine Straight Leg Raises: AAROM;Both;10 reps;Supine    General Comments        Pertinent Vitals/Pain Pain Assessment: 0-10 Pain Score: 8  Faces Pain Scale: Hurts even more Pain Location: B knee, R > L especially on posterior side Pain Descriptors / Indicators: Aching;Grimacing Pain Intervention(s): Monitored during session;Repositioned;Ice applied    Home Living                      Prior Function            PT Goals (current goals can now be found in the care plan section) Acute Rehab PT Goals Patient Stated Goal: return to independence Potential to Achieve Goals: Good Progress towards PT goals: Progressing toward goals    Frequency    7X/week      PT Plan Current plan remains appropriate  Co-evaluation              AM-PAC PT "6 Clicks" Mobility   Outcome Measure  Help needed turning from your back to your side while in a flat bed without using bedrails?: A Lot Help needed moving from lying on your back to sitting on the side of a flat bed without using bedrails?: A Lot Help needed moving to and from a bed to a chair (including a wheelchair)?: A Lot Help needed standing up from a chair using your arms (e.g., wheelchair or bedside chair)?: A Lot Help needed to walk in hospital room?: A Little Help needed climbing 3-5 steps  with a railing? : A Lot 6 Click Score: 13    End of Session Equipment Utilized During Treatment: Gait belt Activity Tolerance: Patient tolerated treatment well Patient left: with family/visitor present;with call bell/phone within reach;in chair Nurse Communication: Mobility status PT Visit Diagnosis: Difficulty in walking, not elsewhere classified (R26.2);Muscle weakness (generalized) (M62.81);Pain Pain - Right/Left: (Bilateral) Pain - part of body: Knee     Time: 1355-1434 PT Time Calculation (min) (ACUTE ONLY): 39 min  Charges:  $Gait Training: 8-22 mins $Therapeutic Exercise: 8-22 mins $Therapeutic Activity: 8-22 mins                     Erasmo Leventhal , PTA Acute Rehabilitation Services Pager [phone] Office [phone]     Betsabe Iglesia [name] 03/27/2020, 5:24 PM

## 2020-03-27 NOTE — Progress Notes (Signed)
Physical Therapy Treatment Patient Details Name: Tristan Hamilton MRN: [id number] DOB: 09/18/1951 Today's Date: 03/27/2020    History of Present Illness Pt is a 69 yo male s/p bilateral TKA 4/6 by Dr. Ninfa Linden. PMH includes: osteoarthritis, DVT, GERD, and prostate cancer.    PT Comments    On arrival patient resting in recliner.  He voiced need for bathroom transfer to have a BM.  Focused on gt within room to the bathroom.  LOB when ambulating toward the toilet requiring moderate assistance to correct.  He continues to benefit from aggressive rehab at [facility] before returning home with support from his wife.    Follow Up Recommendations  CIR;Supervision/Assistance - 24 hour     Equipment Recommendations  Rolling walker with 5" wheels;3in1 (PT)    Recommendations for Other Services Rehab consult     Precautions / Restrictions Precautions Precautions: Fall Precaution Comments: orthostatic Restrictions Weight Bearing Restrictions: Yes RLE Weight Bearing: Weight bearing as tolerated LLE Weight Bearing: Weight bearing as tolerated    Mobility  Bed Mobility               General bed mobility comments: Pt seated in recliner on arrival.  Transfers Overall transfer level: Needs assistance Equipment used: Rolling walker (2 wheeled) Transfers: Sit to/from Stand Sit to Stand: Mod assist         General transfer comment: Pt required assistance for boosting into standing.  Cues for forward weight shifting and foot/hand placement.  Ambulation/Gait Ambulation/Gait assistance: Min assist;Mod assist;+2 safety/equipment Gait Distance (Feet): 15 Feet Assistive device: Rolling walker (2 wheeled) Gait Pattern/deviations: Step-to pattern;Decreased stride length     General Gait Details: Cues for sequencing, quad activation and RW safety.  Decreased distance due to need for toileting.  B buckling noted and required moderate assistance to avoid LOB forward.  Pt utilized B UE to assist  with righting response after buckling.   Stairs             Wheelchair Mobility    Modified Rankin (Stroke Patients Only)       Balance Overall balance assessment: Needs assistance Sitting-balance support: No upper extremity supported;Feet supported Sitting balance-Leahy Scale: Good       Standing balance-Leahy Scale: Poor Standing balance comment: reliant on BUE support on RW                            Cognition Arousal/Alertness: Awake/alert Behavior During Therapy: WFL for tasks assessed/performed Overall Cognitive Status: Within Functional Limits for tasks assessed                                        Exercises      General Comments        Pertinent Vitals/Pain Pain Assessment: 0-10 Faces Pain Scale: Hurts even more Pain Location: B knee, R > L especially on posterior side Pain Descriptors / Indicators: Aching;Grimacing Pain Intervention(s): Monitored during session;Repositioned    Home Living                      Prior Function            PT Goals (current goals can now be found in the care plan section) Acute Rehab PT Goals Patient Stated Goal: return to independence Potential to Achieve Goals: Good Progress towards PT goals: Progressing toward goals  Frequency    7X/week      PT Plan Current plan remains appropriate    Co-evaluation              AM-PAC PT "6 Clicks" Mobility   Outcome Measure  Help needed turning from your back to your side while in a flat bed without using bedrails?: A Lot Help needed moving from lying on your back to sitting on the side of a flat bed without using bedrails?: A Lot Help needed moving to and from a bed to a chair (including a wheelchair)?: A Lot Help needed standing up from a chair using your arms (e.g., wheelchair or bedside chair)?: A Lot Help needed to walk in hospital room?: A Little Help needed climbing 3-5 steps with a railing? : A Lot 6 Click  Score: 13    End of Session Equipment Utilized During Treatment: Gait belt Activity Tolerance: Patient tolerated treatment well Patient left: with family/visitor present;with call bell/phone within reach(sitting on commode with instruction to pull string/call light when finished.) Nurse Communication: Mobility status(informed nursing he was seated on commode.) PT Visit Diagnosis: Difficulty in walking, not elsewhere classified (R26.2);Muscle weakness (generalized) (M62.81);Pain Pain - Right/Left: (Bilateral) Pain - part of body: Knee     Time: 1140-1156 PT Time Calculation (min) (ACUTE ONLY): 16 min  Charges:  $Gait Training: 8-22 mins                     Erasmo Leventhal , PTA Acute Rehabilitation Services Pager [phone] Office [phone]     Ellene Bloodsaw [name] 03/27/2020, 12:14 PM

## 2020-03-27 NOTE — Progress Notes (Signed)
PT Cancellation Note  Patient Details Name: Coalton Rasner MRN: [id number] DOB: [birth year]   Cancelled Treatment:    Reason Eval/Treat Not Completed: (P) Other (comment)(Pt reports he is not feeling well will f/u later this am.)   Cristela Blue 03/27/2020, 11:00 AM Erasmo Leventhal , PTA Acute Rehabilitation Services Pager [phone] Office [phone]

## 2020-03-28 MED ORDER — BISACODYL 10 MG RE SUPP
10.0000 mg | Freq: Once | RECTAL | Status: AC
  Administered 2020-03-29: 08:00:00 10 mg via RECTAL
  Filled 2020-03-28: qty 1

## 2020-03-28 NOTE — Plan of Care (Signed)
  Problem: Education: Goal: Knowledge of General Education information will improve Description Including pain rating scale, medication(s)/side effects and non-pharmacologic comfort measures Outcome: Progressing   

## 2020-03-28 NOTE — Progress Notes (Signed)
Physical Therapy Treatment Patient Details Name: Bayne Buehrer MRN: [id number] DOB: November 08, 1951 Today's Date: 03/28/2020    History of Present Illness Pt is a 69 yo male s/p bilateral TKA 4/6 by Dr. Ninfa Linden. PMH includes: osteoarthritis, DVT, GERD, and prostate cancer.    PT Comments    Pt with improved walking this afternoon - less weight on hands and improved ability to stand tall and take longer steps.  Pt exercises -  working on more knee bending - very hard on right as more edema.  Pt with very limited quad activation - esp on right.  Pt wore right KI for safety walking - able to walk more and feel safer.  Overall pt did better this afternoon with his movement.  He will benefit from CIR level therapy.  PT will follow him until then.   Follow Up Recommendations  CIR;Supervision/Assistance - 24 hour     Equipment Recommendations  Rolling walker with 5" wheels;3in1 (PT)    Recommendations for Other Services Rehab consult     Precautions / Restrictions Precautions Precautions: Fall Precaution Comments: orthostatic - not dizzy today Restrictions Weight Bearing Restrictions: Yes RLE Weight Bearing: Weight bearing as tolerated LLE Weight Bearing: Weight bearing as tolerated Other Position/Activity Restrictions: Pt used KI right leg to prevent buckling as this is most painful and weakest leg    Mobility  Bed Mobility Overal bed mobility: Needs Assistance Bed Mobility: Supine to Sit;Sit to Supine     Supine to sit: Min assist     General bed mobility comments: Pt still in reclienr when i arrived  Transfers Overall transfer level: Needs assistance Equipment used: Rolling walker (2 wheeled) Transfers: Sit to/from Stand Sit to Stand: Mod assist         General transfer comment: pt needs more assist to get up from lower recliner.  hard to get feet under him.  Pt also did toilet transfer - worked to get him more comfortable so he could have  BM  Ambulation/Gait Ambulation/Gait assistance: Herbalist (Feet): 60 Feet Assistive device: Rolling walker (2 wheeled) Gait Pattern/deviations: Step-through pattern;Shuffle;Trunk flexed     General Gait Details: Pt more confident walking this afternoon.  Pt cued to look up. Pt taking longer strides (approx 5 inches from 3 inches).  Overall pt improved gait and less weight on his hands.  Wife followed with cahir just in case.  Pt no longer gets dizzy when up   Stairs             Wheelchair Mobility    Modified Rankin (Stroke Patients Only)       Balance             Standing balance-Leahy Scale: Good Standing balance comment: reliant on BUE support on RW                            Cognition Arousal/Alertness: Awake/alert Behavior During Therapy: [facility] for tasks assessed/performed Overall Cognitive Status: Within Functional Limits for tasks assessed                                 General Comments: wife present throughout session      Exercises Total Joint Exercises Ankle Circles/Pumps: AROM;Both;20 reps;Seated Quad Sets: AROM;Both;10 reps;Seated Short Arc Quad: AAROM;Both;Supine;10 reps Heel Slides: AAROM;Both;10 reps;Supine Hip ABduction/ADduction: AAROM;Both;10 reps;Supine Long Arc Quad: 10 reps;AAROM;Both;Seated Knee Flexion: AAROM;Both;Seated  General Comments General comments (skin integrity, edema, etc.): Pt denies any dizziness - just got tired -esp in hands when walking.      Pertinent Vitals/Pain Pain Assessment: 0-10 Pain Score: 7  Pain Location: B knee, R > L Pain Descriptors / Indicators: Aching;Grimacing;Burning;Pressure Pain Intervention(s): Limited activity within patient's tolerance;Monitored during session;RN gave pain meds during session    Home Living                      Prior Function            PT Goals (current goals can now be found in the care plan section) Acute Rehab PT  Goals Patient Stated Goal: return to independence Progress towards PT goals: Progressing toward goals    Frequency    7X/week      PT Plan Current plan remains appropriate    Co-evaluation              AM-PAC PT "6 Clicks" Mobility   Outcome Measure  Help needed turning from your back to your side while in a flat bed without using bedrails?: A Lot Help needed moving from lying on your back to sitting on the side of a flat bed without using bedrails?: A Lot Help needed moving to and from a bed to a chair (including a wheelchair)?: A Lot Help needed standing up from a chair using your arms (e.g., wheelchair or bedside chair)?: A Lot Help needed to walk in hospital room?: A Little Help needed climbing 3-5 steps with a railing? : Total 6 Click Score: 12    End of Session Equipment Utilized During Treatment: Gait belt Activity Tolerance: Patient tolerated treatment well;Patient limited by fatigue Patient left: with family/visitor present;with call bell/phone within reach Nurse Communication: Mobility status;Precautions PT Visit Diagnosis: Difficulty in walking, not elsewhere classified (R26.2);Muscle weakness (generalized) (M62.81);Pain Pain - part of body: Knee     Time: 1440-1540 PT Time Calculation (min) (ACUTE ONLY): 60 min  Charges:  $Gait Training: 23-37 mins $Therapeutic Exercise: 23-37 mins                     03/28/2020   Rande Lawman, PT    Loyal Buba 03/28/2020, 5:19 PM

## 2020-03-28 NOTE — Plan of Care (Signed)

## 2020-03-28 NOTE — Progress Notes (Signed)
Subjective: 4 Days Post-Op Procedure(s) (LRB): TOTAL KNEE BILATERAL (Bilateral) Patient reports pain as moderate.  Decreased appetite since surgery no BM. Complainant of no BM. Positive flatus.   Objective: Vital signs in last 24 hours: Temp:  [98.2 F (36.8 C)-99.2 F (37.3 C)] 98.2 F (36.8 C) (04/10 0800) Pulse Rate:  [83-91] 91 (04/10 0800) Resp:  [17-18] 18 (04/10 0800) BP: (104-131)/(58-75) 104/65 (04/10 0800) SpO2:  [92 %-100 %] 97 % (04/10 0800)  Intake/Output from previous day: 04/09 0701 - 04/10 0700 In: 1080 [P.O.:1080] Out: 200 [Urine:200] Intake/Output this shift: No intake/output data recorded.  Recent Labs    03/26/20 0744  HGB 11.6*   Recent Labs    03/26/20 0744  WBC 11.8*  RBC 3.62*  HCT 34.4*  PLT 127*   Recent Labs    03/26/20 0744  NA 135  K 4.2  CL 100  CO2 28  BUN 12  CREATININE 1.09  GLUCOSE 132*  CALCIUM 8.3*   No results for input(s): LABPT, INR in the last 72 hours.  Bilateral Lower extremities: Sensation intact distally Dorsiflexion/Plantar flexion intact Incision: dressing C/D/I Compartment soft   Assessment/Plan: 4 Days Post-Op Procedure(s) (LRB): TOTAL KNEE BILATERAL (Bilateral) Up with therapy  Possible inpatient rehab Dulcolax PR      Kamilya Wakeman 03/28/2020, 10:13 AM

## 2020-03-28 NOTE — Progress Notes (Signed)
Physical Therapy Treatment Patient Details Name: Tristan Hamilton MRN: [id number] DOB: 13-Aug-1951 Today's Date: 03/28/2020    History of Present Illness Pt is a 69 yo male s/p bilateral TKA 4/6 by Dr. Ninfa Linden. PMH includes: osteoarthritis, DVT, GERD, and prostate cancer.    PT Comments    Pt did exercises and walking today. H e is stiff in both knees and more difficult to do quad contractions on right.  Pt cooperative and progressing as expected.  Will continue until Rehab admit.  Follow Up Recommendations  CIR;Supervision/Assistance - 24 hour     Equipment Recommendations  Rolling walker with 5" wheels;3in1 (PT)    Recommendations for Other Services Rehab consult     Precautions / Restrictions Precautions Precautions: Fall Precaution Comments: orthostatic Restrictions Weight Bearing Restrictions: Yes RLE Weight Bearing: Weight bearing as tolerated LLE Weight Bearing: Weight bearing as tolerated    Mobility  Bed Mobility Overal bed mobility: Needs Assistance Bed Mobility: Supine to Sit;Sit to Supine     Supine to sit: Min assist     General bed mobility comments: pt abel to move his legs to EOB and I helped to lower them to the floor.  Transfers Overall transfer level: Needs assistance Equipment used: Rolling walker (2 wheeled) Transfers: Sit to/from Stand Sit to Stand: Min assist         General transfer comment: Pt required assistance for boosting into standing from very high bed.  Cues for forward weight shifting and foot/hand placement.  Ambulation/Gait Ambulation/Gait assistance: Min assist(wife following with the chair) Gait Distance (Feet): 50 Feet Assistive device: Rolling walker (2 wheeled) Gait Pattern/deviations: Step-to pattern;Decreased stride length;Trunk flexed     General Gait Details: pt right leg - minimal active quad movement with exercises - used KI for safety when up on right today to encourage longer walking.  Pt did well with short  strides.  His hands became tired first.   Marine scientist Rankin (Stroke Patients Only)       Balance             Standing balance-Leahy Scale: Good Standing balance comment: reliant on BUE support on RW                            Cognition Arousal/Alertness: Awake/alert Behavior During Therapy: WFL for tasks assessed/performed Overall Cognitive Status: Within Functional Limits for tasks assessed                                 General Comments: wife present throughout session      Exercises Total Joint Exercises Ankle Circles/Pumps: AROM;Both;Supine;20 reps Quad Sets: AROM;Both;10 reps;Supine Short Arc Quad: AAROM;Both;Supine;10 reps Heel Slides: AAROM;Both;10 reps;Supine Hip ABduction/ADduction: AAROM;Both;10 reps;Supine    General Comments General comments (skin integrity, edema, etc.): Pt denies any dizziness - just got tired -esp in hands when walking.      Pertinent Vitals/Pain Pain Assessment: 0-10 Pain Score: 7  Pain Location: B knee, R > L Pain Descriptors / Indicators: Aching;Grimacing;Burning;Pressure Pain Intervention(s): Limited activity within patient's tolerance;Monitored during session;Repositioned    Home Living                      Prior Function            PT Goals (current  goals can now be found in the care plan section) Acute Rehab PT Goals Patient Stated Goal: return to independence Progress towards PT goals: Progressing toward goals    Frequency    7X/week      PT Plan Current plan remains appropriate    Co-evaluation              AM-PAC PT "6 Clicks" Mobility   Outcome Measure  Help needed turning from your back to your side while in a flat bed without using bedrails?: A Lot Help needed moving from lying on your back to sitting on the side of a flat bed without using bedrails?: A Lot Help needed moving to and from a bed to a chair  (including a wheelchair)?: A Lot Help needed standing up from a chair using your arms (e.g., wheelchair or bedside chair)?: A Lot Help needed to walk in hospital room?: A Little Help needed climbing 3-5 steps with a railing? : Total 6 Click Score: 12    End of Session Equipment Utilized During Treatment: Gait belt(I used right KI for safety due to poor right quad contraction today when walking) Activity Tolerance: Patient tolerated treatment well;Patient limited by fatigue Patient left: with family/visitor present;with call bell/phone within reach;in chair Nurse Communication: Mobility status PT Visit Diagnosis: Difficulty in walking, not elsewhere classified (R26.2);Muscle weakness (generalized) (M62.81);Pain Pain - part of body: Knee     Time: 1125-1225 PT Time Calculation (min) (ACUTE ONLY): 60 min  Charges:  $Gait Training: 23-37 mins $Therapeutic Exercise: 23-37 mins                     03/28/2020   Rande Lawman, PT    Loyal Buba 03/28/2020, 1:38 PM

## 2020-03-29 NOTE — Progress Notes (Addendum)
Md notified of pt's BP,increased temp and pt's report of chills @2012  Pt's bp has gradually trended up throughout  pts stay and the temp has been low grade 9/10 pm shift and during the day today, with the highest being 101.23F this shift .   I explained to the MD  that I would give tylenol for temp but just wanted to make him aware of the VS trend   He asked me to encourage the pt to cough and deep breath and use the IS and he agreed with the tylenol   I administered the tylenol and educated the pt on the importance getting up, deep breathing ,coughing and using his IS   Will recheck temp and continue to monitor

## 2020-03-29 NOTE — Progress Notes (Signed)
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 Ninfa Linden. PMH includes: osteoarthritis, DVT, GERD, and prostate cancer.    PT Comments    Pt tolerated treatment well. Pt continues to demonstrate slowed gait speed, with short step to gait with increased dual stance time. Pt requiring physical assistance to perform all bed mobility and transfers at this time due to LE weakness and pain. Pt with minor instances of knee instability and continues to wear R knee immobilizer during activity to reduce falls risk. Pt will continue to benefit from PT POC to restore independence.   Follow Up Recommendations  CIR;Supervision/Assistance - 24 hour     Equipment Recommendations  Rolling walker with 5" wheels;3in1 (PT)    Recommendations for Other Services       Precautions / Restrictions Precautions Precautions: Fall Precaution Comments: orthostatics-no dizziness this session Restrictions Weight Bearing Restrictions: Yes RLE Weight Bearing: Weight bearing as tolerated LLE Weight Bearing: Weight bearing as tolerated Other Position/Activity Restrictions: Pt used KI right leg to prevent buckling as this is most painful and weakest leg    Mobility  Bed Mobility Overal bed mobility: Needs Assistance Bed Mobility: Sit to Supine       Sit to supine: Mod assist   General bed mobility comments: PT lifting BLE and pt pivoting on bed to return to supine  Transfers Overall transfer level: Needs assistance Equipment used: Rolling walker (2 wheeled) Transfers: (stand to sit)           General transfer comment: pt requires modA to transition from stand to sit with elevated bed  Ambulation/Gait Ambulation/Gait assistance: Min assist Gait Distance (Feet): 100 Feet Assistive device: Rolling walker (2 wheeled) Gait Pattern/deviations: Step-to  pattern Gait velocity: reduced Gait velocity interpretation: <1.8 ft/sec, indicate of risk for recurrent falls General Gait Details: pt with shortened step to gait pattern, reduced stance time on RLE compared to LLE. Pt with 2 minor LOB during gait requiring minimal PT assist to correct   Stairs             Wheelchair Mobility    Modified Rankin (Stroke Patients Only)       Balance Overall balance assessment: Needs assistance Sitting-balance support: No upper extremity supported;Feet supported Sitting balance-Leahy Scale: Good Sitting balance - Comments: supervision   Standing balance support: Bilateral upper extremity supported Standing balance-Leahy Scale: Good Standing balance comment: supervision, reliant on unilateral UE support for short periods and BUE support for prolonged periods                            Cognition Arousal/Alertness: Awake/alert Behavior During Therapy: WFL for tasks assessed/performed Overall Cognitive Status: Within Functional Limits for tasks assessed                                        Exercises      General Comments General comments (skin integrity, edema, etc.): pt without reports of dizziness      Pertinent Vitals/Pain Pain Assessment: 0-10 Pain Score: 6  Pain Location: B knee, R > L Pain Descriptors / Indicators: Aching;Grimacing;Burning;Pressure Pain Intervention(s): Limited activity within patient's tolerance    Home Living  Prior Function            PT Goals (current goals can now be found in the care plan section) Acute Rehab PT Goals Patient Stated Goal: return to independence Progress towards PT goals: Progressing toward goals    Frequency    7X/week      PT Plan Current plan remains appropriate    Co-evaluation              AM-PAC PT "6 Clicks" Mobility   Outcome Measure  Help needed turning from your back to your side while in a  flat bed without using bedrails?: A Lot Help needed moving from lying on your back to sitting on the side of a flat bed without using bedrails?: A Lot Help needed moving to and from a bed to a chair (including a wheelchair)?: A Lot Help needed standing up from a chair using your arms (e.g., wheelchair or bedside chair)?: A Lot Help needed to walk in hospital room?: A Little Help needed climbing 3-5 steps with a railing? : Total 6 Click Score: 12    End of Session Equipment Utilized During Treatment: Gait belt Activity Tolerance: Patient tolerated treatment well Patient left: in bed;with call bell/phone within reach Nurse Communication: Mobility status PT Visit Diagnosis: Difficulty in walking, not elsewhere classified (R26.2);Muscle weakness (generalized) (M62.81);Pain Pain - part of body: Knee     Time: 1449-1535 PT Time Calculation (min) (ACUTE ONLY): 46 min  Charges:  $Gait Training: 23-37 mins $Therapeutic Activity: 8-22 mins                    Zenaida Niece, PT, DPT Acute Rehabilitation Pager: [phone]    Zenaida Niece 03/29/2020, 3:48 PM

## 2020-03-29 NOTE — Progress Notes (Signed)
Patient ID: Tristan Hamilton, male   DOB: 06/01/1951, 69 y.o.   MRN: [id number] The patient is now 5 days status post bilateral total knee arthroplasties.  He is making progress with his mobility with therapy.  Therapy had recommended inpatient rehab.  This is pending insurance approval.  He does have several steps to get into his house.  On examination today both knee dressings are clean and dry.  His calves are soft.  He is able to move his feet back and forth.  Hopefully tomorrow will have some type of plan in terms of whether or not insurance will approve inpatient rehab.  If not, we will have to come up with any other plans such as skilled nursing versus home.  Once again, there are issues being able to get him into his house as well.

## 2020-03-30 LAB — CBC
HCT: 29.7 % — ABNORMAL LOW (ref 39.0–52.0)
Hemoglobin: 9.8 g/dL — ABNORMAL LOW (ref 13.0–17.0)
MCH: 30.8 pg (ref 26.0–34.0)
MCHC: 33 g/dL (ref 30.0–36.0)
MCV: 93.4 fL (ref 80.0–100.0)
Platelets: 258 10*3/uL (ref 150–400)
RBC: 3.18 MIL/uL — ABNORMAL LOW (ref 4.22–5.81)
RDW: 12.8 % (ref 11.5–15.5)
WBC: 9.8 10*3/uL (ref 4.0–10.5)
nRBC: 0 % (ref 0.0–0.2)

## 2020-03-30 NOTE — Progress Notes (Signed)
Physical Therapy Treatment Patient Details Name: Tristan Hamilton MRN: [id number] DOB: 1951/04/19 Today's Date: 03/30/2020    History of Present Illness Pt is a 69 yo male s/p bilateral TKA 4/6 by Dr. Ninfa Linden. PMH includes: osteoarthritis, DVT, GERD, and prostate cancer.    PT Comments    Pt supine in bed on arrival this session.  He is pleasant and eager to mobilize this session.  He started with therapeutic exercises in supine and used gt belt to self assist.  Educated on HS stretch when back of leg feels tight.  He performed ambulation with minimal buckling and min assistance.  Continue to recommend aggressive CIR therapies at this time to maximize functional gains before returning home.    Follow Up Recommendations  CIR;Supervision/Assistance - 24 hour     Equipment Recommendations  Rolling walker with 5" wheels;3in1 (PT)    Recommendations for Other Services Rehab consult     Precautions / Restrictions Precautions Precautions: Fall Precaution Comments: orthostatics-no dizziness this session Restrictions Weight Bearing Restrictions: Yes RLE Weight Bearing: Weight bearing as tolerated LLE Weight Bearing: Weight bearing as tolerated    Mobility  Bed Mobility Overal bed mobility: Needs Assistance Bed Mobility: Supine to Sit     Supine to sit: Min guard     General bed mobility comments: Increased time and effort to move to edge of bed.  Very guarded due to pain.  Transfers Overall transfer level: Needs assistance Equipment used: Rolling walker (2 wheeled) Transfers: Sit to/from Stand Sit to Stand: Min assist;From elevated surface         General transfer comment: Min assistance from elevated surface.  Lack knee flexion to prepare for standing.  Increased weight shifting forward to rise into standing.  Ambulation/Gait Ambulation/Gait assistance: Min assist Gait Distance (Feet): 100 Feet Assistive device: Rolling walker (2 wheeled) Gait Pattern/deviations:  Step-to pattern;Antalgic;Trunk flexed     General Gait Details: Pt with step to pattern, buckling noted x 2 require min assistance to correct.  Pt able to perform turns in RW.  Pain and weakness limit function.   Stairs             Wheelchair Mobility    Modified Rankin (Stroke Patients Only)       Balance Overall balance assessment: Needs assistance Sitting-balance support: No upper extremity supported;Feet supported Sitting balance-Leahy Scale: Good       Standing balance-Leahy Scale: Poor Standing balance comment: reliant on UE support, able to static stand with single UE support                            Cognition Arousal/Alertness: Awake/alert Behavior During Therapy: WFL for tasks assessed/performed Overall Cognitive Status: Within Functional Limits for tasks assessed                                 General Comments: wife present throughout session      Exercises Total Joint Exercises Ankle Circles/Pumps: AROM;Both;20 reps;Seated Quad Sets: AROM;Both;10 reps;Seated Short Arc Quad: AAROM;Both;Supine;10 reps Heel Slides: AAROM;Both;10 reps;Supine;Limitations Heel Slides Limitations: R knee lacks ROM Hip ABduction/ADduction: AAROM;Both;10 reps;Supine Straight Leg Raises: AAROM;Both;10 reps;Supine    General Comments        Pertinent Vitals/Pain Pain Assessment: 0-10 Pain Score: 6  Pain Location: bilateral knees Pain Descriptors / Indicators: Aching;Sore Pain Intervention(s): Monitored during session;Repositioned    Home Living  Prior Function            PT Goals (current goals can now be found in the care plan section) Acute Rehab PT Goals Patient Stated Goal: to return to independence Potential to Achieve Goals: Good Progress towards PT goals: Progressing toward goals    Frequency    7X/week      PT Plan Current plan remains appropriate    Co-evaluation               AM-PAC PT "6 Clicks" Mobility   Outcome Measure  Help needed turning from your back to your side while in a flat bed without using bedrails?: A Little Help needed moving from lying on your back to sitting on the side of a flat bed without using bedrails?: A Little Help needed moving to and from a bed to a chair (including a wheelchair)?: A Little Help needed standing up from a chair using your arms (e.g., wheelchair or bedside chair)?: A Little Help needed to walk in hospital room?: A Little Help needed climbing 3-5 steps with a railing? : A Lot 6 Click Score: 17    End of Session Equipment Utilized During Treatment: Gait belt Activity Tolerance: Patient tolerated treatment well Patient left: with call bell/phone within reach(seated on commode. Instructions to pull string when finished.) Nurse Communication: Mobility status PT Visit Diagnosis: Difficulty in walking, not elsewhere classified (R26.2);Muscle weakness (generalized) (M62.81);Pain Pain - Right/Left: (Bilateral) Pain - part of body: Knee     Time: 1457-1531 PT Time Calculation (min) (ACUTE ONLY): 34 min  Charges:  $Gait Training: 8-22 mins $Therapeutic Exercise: 8-22 mins                     Tristan Hamilton , PTA Acute Rehabilitation Services Pager [phone] Office [phone]     Tristan Hamilton [name] 03/30/2020, 3:59 PM

## 2020-03-30 NOTE — Progress Notes (Signed)
Physical Therapy Treatment Patient Details Name: Tristan Hamilton MRN: [id number] DOB: 06-03-1951 Today's Date: 03/30/2020    History of Present Illness Pt is a 69 yo male s/p bilateral TKA 4/6 by Dr. Ninfa Linden. PMH includes: osteoarthritis, DVT, GERD, and prostate cancer.    PT Comments    Pt supine in bed.  Reviewed Incentive Spirometry.  Pt remains with low grade fever 99.9 at time of tx.  Focused on LE strengthening/ROM and progressing gt training.  Pt continues to benefit from aggressive rehab to maximize ROM gains and functional independence.  He continues to be an excellent candidate for CIR.    Follow Up Recommendations  CIR;Supervision/Assistance - 24 hour     Equipment Recommendations  Rolling walker with 5" wheels;3in1 (PT)    Recommendations for Other Services Rehab consult     Precautions / Restrictions Precautions Precautions: Fall Precaution Comments: orthostatics-no dizziness this session Restrictions Weight Bearing Restrictions: Yes RLE Weight Bearing: Weight bearing as tolerated LLE Weight Bearing: Weight bearing as tolerated    Mobility  Bed Mobility Overal bed mobility: Needs Assistance Bed Mobility: Supine to Sit     Supine to sit: Min guard Sit to supine: Min assist   General bed mobility comments: Increased time and effort to move to edge of bed.  Very guarded due to pain.  Transfers Overall transfer level: Needs assistance Equipment used: Rolling walker (2 wheeled) Transfers: Sit to/from Stand Sit to Stand: Min assist;From elevated surface         General transfer comment: Min assistance from elevated surface.  Lack knee flexion to prepare for standing.  Increased weight shifting forward to rise into standing.  Ambulation/Gait Ambulation/Gait assistance: Min assist Gait Distance (Feet): 100 Feet Assistive device: Rolling walker (2 wheeled) Gait Pattern/deviations: Step-to pattern;Antalgic;Trunk flexed     General Gait Details: Pt with  step to pattern, buckling noted x 2 require min assistance to correct.  Pt able to perform turns in RW.  Pain and weakness limit function.   Stairs             Wheelchair Mobility    Modified Rankin (Stroke Patients Only)       Balance Overall balance assessment: Needs assistance Sitting-balance support: No upper extremity supported;Feet supported Sitting balance-Leahy Scale: Good Sitting balance - Comments: sup   Standing balance support: Bilateral upper extremity supported;Single extremity supported Standing balance-Leahy Scale: Fair Standing balance comment: reliant on UE support, able to static stand with single UE support                            Cognition Arousal/Alertness: Awake/alert Behavior During Therapy: WFL for tasks assessed/performed Overall Cognitive Status: Within Functional Limits for tasks assessed                                 General Comments: wife present throughout session      Exercises Total Joint Exercises Ankle Circles/Pumps: AROM;Both;20 reps;Seated Quad Sets: AROM;Both;10 reps;Seated Heel Slides: AAROM;Both;10 reps;Supine;Limitations Heel Slides Limitations: R knee lacks ROM Hip ABduction/ADduction: AAROM;Both;10 reps;Supine Straight Leg Raises: AAROM;Both;10 reps;Supine    General Comments General comments (skin integrity, edema, etc.): no reports of dizziness this session      Pertinent Vitals/Pain Pain Assessment: 0-10 Pain Score: 4  Pain Location: bilateral knees Pain Descriptors / Indicators: Aching;Sore Pain Intervention(s): Monitored during session;Repositioned    Home Living  Prior Function            PT Goals (current goals can now be found in the care plan section) Acute Rehab PT Goals Patient Stated Goal: to return to independence Potential to Achieve Goals: Good Progress towards PT goals: Progressing toward goals    Frequency    7X/week       PT Plan Current plan remains appropriate    Co-evaluation              AM-PAC PT "6 Clicks" Mobility   Outcome Measure  Help needed turning from your back to your side while in a flat bed without using bedrails?: A Lot Help needed moving from lying on your back to sitting on the side of a flat bed without using bedrails?: A Lot Help needed moving to and from a bed to a chair (including a wheelchair)?: A Lot Help needed standing up from a chair using your arms (e.g., wheelchair or bedside chair)?: A Lot Help needed to walk in hospital room?: A Little Help needed climbing 3-5 steps with a railing? : Total 6 Click Score: 12    End of Session Equipment Utilized During Treatment: Gait belt Activity Tolerance: Patient tolerated treatment well Patient left: in bed;with call bell/phone within reach Nurse Communication: Mobility status PT Visit Diagnosis: Difficulty in walking, not elsewhere classified (R26.2);Muscle weakness (generalized) (M62.81);Pain Pain - Right/Left: (Bilateral) Pain - part of body: Knee     Time: 1005-1031 PT Time Calculation (min) (ACUTE ONLY): 26 min  Charges:  $Gait Training: 8-22 mins $Therapeutic Exercise: 8-22 mins                     Erasmo Leventhal , PTA Acute Rehabilitation Services Pager [phone] Office [phone]     Eddi Hymes [name] 03/30/2020, 1:12 PM

## 2020-03-30 NOTE — Progress Notes (Signed)
Patient ID: Tristan Hamilton, male   DOB: 11-12-1951, 69 y.o.   MRN: [id number] The patient is afebrile this morning with a temperature of 98.  His vital signs are stable.  This includes his blood pressure and heart rate.  He does get chills and sweats at night and he did have a temperature spike.  He is only had 1 bowel movement since surgery.  He is now postop day 6.  He has bilateral total knee arthroplasties.  I changed his dressings at the bedside.  Both knee incisions are clean, dry and intact.  There is no redness or evidence of cellulitis.  His calves are soft bilaterally.  I gave him reassurance that this is certainly normal due to a combination of immobility, him being supine quite a bit of the day and narcotic pain medications.  Hopefully we will have a disposition from inpatient rehab today.  He can be discharged from Ortho standpoint to inpatient rehab if he does qualify for this from an insurance standpoint.  If he does not, the transition care team will need to work on skilled nursing facility placement.

## 2020-03-30 NOTE — Progress Notes (Signed)
Inpatient Rehab Admissions Coordinator:   Received a call from pt's insurance asking for updated clinicals in order to make a determination on the case. PT and OT already scheduled to see pt today and will send updated notes as soon as they are available.    Shann Medal, PT, DPT Admissions Coordinator [phone] 03/30/20  10:15 AM

## 2020-03-30 NOTE — Progress Notes (Signed)
Occupational Therapy Treatment Patient Details Name: Tristan Hamilton MRN: [id number] DOB: Feb 11, 1951 Today's Date: 03/30/2020    History of present illness Pt is a 69 yo male s/p bilateral TKA 4/6 by Dr. Ninfa Linden. PMH includes: osteoarthritis, DVT, GERD, and prostate cancer.   OT comments  Pt returning to room with PT upon OT arrival. Pt agreeable to OT session. He required minA for functional mobiltiy at RW level, minA for toilet transfer and modA for LB dressing. Pt continues to demonstrate limitations with knee flexion, applied CPM at end of session. Pt will continue to benefit from skilled OT services to maximize safety and independence with ADL/IADL and functional mobility. Will continue to follow acutely and progress as tolerated.    Follow Up Recommendations  CIR    Equipment Recommendations  Other (comment)(defer to next venue)    Recommendations for Other Services      Precautions / Restrictions Precautions Precautions: Fall Precaution Comments: orthostatics-no dizziness this session Restrictions Weight Bearing Restrictions: Yes RLE Weight Bearing: Weight bearing as tolerated LLE Weight Bearing: Weight bearing as tolerated       Mobility Bed Mobility Overal bed mobility: Needs Assistance Bed Mobility: Sit to Supine       Sit to supine: Min assist   General bed mobility comments: modA for BLE management  Transfers Overall transfer level: Needs assistance Equipment used: Rolling walker (2 wheeled) Transfers: Sit to/from Stand Sit to Stand: Min assist         General transfer comment: minA for stability     Balance Overall balance assessment: Needs assistance Sitting-balance support: No upper extremity supported;Feet supported Sitting balance-Leahy Scale: Good Sitting balance - Comments: sup   Standing balance support: Bilateral upper extremity supported;Single extremity supported Standing balance-Leahy Scale: Fair Standing balance comment: reliant on  UE support, able to static stand with single UE support                           ADL either performed or assessed with clinical judgement   ADL Overall ADL's : Needs assistance/impaired                     Lower Body Dressing: Moderate assistance;Sit to/from stand   Toilet Transfer: Minimal assistance;Ambulation;RW;Grab bars Toilet Transfer Details (indicate cue type and reason): forward trunk flexion due to decreased knee flexion Toileting- Clothing Manipulation and Hygiene: Minimal assistance;Sit to/from stand       Functional mobility during ADLs: Minimal assistance;Rolling walker       Vision       Perception     Praxis      Cognition Arousal/Alertness: Awake/alert Behavior During Therapy: WFL for tasks assessed/performed Overall Cognitive Status: Within Functional Limits for tasks assessed                                 General Comments: wife present throughout session        Exercises     Shoulder Instructions       General Comments no reports of dizziness this session    Pertinent Vitals/ Pain       Pain Assessment: 0-10 Pain Score: 6  Pain Location: bilateral knees Pain Descriptors / Indicators: Aching;Sore Pain Intervention(s): Limited activity within patient's tolerance;Monitored during session;Ice applied;Other (comment)(CPM)  Home Living  Prior Functioning/Environment              Frequency  Min 2X/week        Progress Toward Goals  OT Goals(current goals can now be found in the care plan section)  Progress towards OT goals: Progressing toward goals  Acute Rehab OT Goals Patient Stated Goal: to return to independence OT Goal Formulation: With patient Time For Goal Achievement: 04/08/20 Potential to Achieve Goals: Good ADL Goals Pt Will Perform Lower Body Bathing: with min guard assist;sit to/from stand Pt Will Perform Lower Body  Dressing: with min guard assist;sit to/from stand Pt Will Perform Toileting - Clothing Manipulation and hygiene: with min guard assist;sit to/from stand Pt Will Perform Tub/Shower Transfer: Shower transfer;with min guard assist;ambulating;shower seat;rolling walker Additional ADL Goal #1: Pt will complete bed mobility at supervision level to prepare for OOB ADLs.  Plan Discharge plan remains appropriate    Co-evaluation                 AM-PAC OT "6 Clicks" Daily Activity     Outcome Measure   Help from another person eating meals?: A Little Help from another person taking care of personal grooming?: A Little Help from another person toileting, which includes using toliet, bedpan, or urinal?: A Lot Help from another person bathing (including washing, rinsing, drying)?: A Lot Help from another person to put on and taking off regular upper body clothing?: A Little Help from another person to put on and taking off regular lower body clothing?: A Lot 6 Click Score: 15    End of Session Equipment Utilized During Treatment: Gait belt;Rolling walker  OT Visit Diagnosis: Unsteadiness on feet (R26.81);Muscle weakness (generalized) (M62.81);Pain Pain - part of body: Knee   Activity Tolerance Patient tolerated treatment well   Patient Left in bed;with call bell/phone within reach;with family/visitor present;Other (comment)(with CPM applied)   Nurse Communication Mobility status        Time: [id number] OT Time Calculation (min): 36 min  Charges: OT General Charges $OT Visit: 1 Visit OT Treatments $Self Care/Home Management : 23-37 mins  Helene Kelp OTR/L Acute Rehabilitation Services Office: [place] 03/30/2020, 11:18 AM

## 2020-03-31 ENCOUNTER — Inpatient Hospital Stay (HOSPITAL_COMMUNITY): Payer: Medicare Other

## 2020-03-31 ENCOUNTER — Telehealth: Payer: Self-pay

## 2020-03-31 NOTE — Telephone Encounter (Signed)
They are in surgery today, he won't be able to do it

## 2020-03-31 NOTE — Telephone Encounter (Signed)
The Psychologist, occupational stated that a p2p needs to be done to get this pt into  inpatient rehab  Peer to peer needs to be done today by 2pm- Can be done by MD or PA  P2P # [phone] option 5   If any question you can call her back @ [phone]. Her name is [employer]

## 2020-03-31 NOTE — Progress Notes (Signed)
Patient ID: Tristan Hamilton, male   DOB: 03/20/1951, 69 y.o.   MRN: [id number] One week post bilateral total knees.  CBC last evening with normal WBC and stable H&H.  Will order portable chest x-ray - temp spikes likely atelectasis.  CIR still pending.  Appears comfortable this morning with normal temp and stable vitals.

## 2020-03-31 NOTE — Progress Notes (Signed)
Physical Therapy Treatment Patient Details Name: Tristan Hamilton MRN: [id number] DOB: [birth year] Today's Date: 03/31/2020    History of Present Illness Pt is a 69 yo male s/p bilateral TKA 4/6 by Dr. Ninfa Linden. PMH includes: osteoarthritis, DVT, GERD, and prostate cancer.    PT Comments    Pt seated in recliner.  Performed gt training, stair training and toilet transfer this pm.  He continues to require moderate assistance for stair training.  He also required moderate assistance to move into standing from commode.  Plan for aggressive CIR remains appropriate.    HEP and stair training hand out issued this session.     Follow Up Recommendations  CIR;Supervision/Assistance - 24 hour     Equipment Recommendations  Rolling walker with 5" wheels;3in1 (PT)    Recommendations for Other Services Rehab consult     Precautions / Restrictions Precautions Precautions: Fall Precaution Comments: orthostatics-no dizziness this session Restrictions Weight Bearing Restrictions: Yes RLE Weight Bearing: Weight bearing as tolerated LLE Weight Bearing: Weight bearing as tolerated Other Position/Activity Restrictions: Pt used KI right leg to prevent buckling as this is most painful and weakest leg    Mobility  Bed Mobility Overal bed mobility: Needs Assistance Bed Mobility: Supine to Sit     Supine to sit: Supervision Sit to supine: Min guard   General bed mobility comments: Pt utilized gt belt to lift B LEs back to bed against gravity.  He required assistance to place gt belt on his feet for set up.  Transfers Overall transfer level: Needs assistance Equipment used: Rolling walker (2 wheeled) Transfers: Sit to/from Stand Sit to Stand: Min guard;Mod assist(min guard from recliner and moderate assistance from commode.)         General transfer comment: Pt requring varrying levels of support from different surfaces.  He required decreased assistance from chair but from commode require  moderate assistance to boost into standing.  Ambulation/Gait Ambulation/Gait assistance: Min guard Gait Distance (Feet): 70 Feet Assistive device: Rolling walker (2 wheeled) Gait Pattern/deviations: Step-to pattern;Antalgic;Trunk flexed;Step-through pattern Gait velocity: reduced   General Gait Details: Pt remains slow and guarded due to pain but able to progress to step through pattern this pm.   Stairs Stairs: Yes Stairs assistance: Mod assist Stair Management: No rails;Backwards;With walker Number of Stairs: 2 General stair comments: Cues for sequencing and RW placement.  Moderate assistance to negotiate stairs and manage bracing of RW.  Wife present and participated in bracing of RW.   Wheelchair Mobility    Modified Rankin (Stroke Patients Only)       Balance Overall balance assessment: Needs assistance Sitting-balance support: No upper extremity supported;Feet supported Sitting balance-Leahy Scale: Good       Standing balance-Leahy Scale: Poor Standing balance comment: reliant on UE support, able to static stand with single UE support                            Cognition Arousal/Alertness: Awake/alert Behavior During Therapy: WFL for tasks assessed/performed Overall Cognitive Status: Within Functional Limits for tasks assessed                                 General Comments: Pt appears in better spirits this session.      Exercises     General Comments        Pertinent Vitals/Pain Pain Assessment: Faces Faces Pain Scale: Hurts even more  Pain Location: bilateral knees Pain Descriptors / Indicators: Aching;Sore Pain Intervention(s): Monitored during session;Repositioned;Ice applied;Patient requesting pain meds-RN notified    Home Living                      Prior Function            PT Goals (current goals can now be found in the care plan section) Acute Rehab PT Goals Patient Stated Goal: to return to  independence Potential to Achieve Goals: Good Progress towards PT goals: Progressing toward goals    Frequency    7X/week      PT Plan Current plan remains appropriate    Co-evaluation              AM-PAC PT "6 Clicks" Mobility   Outcome Measure  Help needed turning from your back to your side while in a flat bed without using bedrails?: A Little Help needed moving from lying on your back to sitting on the side of a flat bed without using bedrails?: A Little Help needed moving to and from a bed to a chair (including a wheelchair)?: A Little Help needed standing up from a chair using your arms (e.g., wheelchair or bedside chair)?: A Little Help needed to walk in hospital room?: A Little Help needed climbing 3-5 steps with a railing? : A Lot 6 Click Score: 17    End of Session Equipment Utilized During Treatment: Gait belt Activity Tolerance: Patient tolerated treatment well Patient left: with call bell/phone within reach;in chair Nurse Communication: Mobility status PT Visit Diagnosis: Difficulty in walking, not elsewhere classified (R26.2);Muscle weakness (generalized) (M62.81);Pain Pain - Right/Left: (Bilateral) Pain - part of body: Knee     Time: [id number] PT Time Calculation (min) (ACUTE ONLY): 40 min  Charges:  $Gait Training: 8-22 mins  $Therapeutic Activity: 23-37 mins                     Erasmo Leventhal , PTA Acute Rehabilitation Services Pager [phone] Office [phone]     Erman Thum [name] 03/31/2020, 5:21 PM

## 2020-03-31 NOTE — Progress Notes (Signed)
Patient ID: Tristan Hamilton, male   DOB: [birth year], 69 y.o.   MRN: [id number] I have been informed by inpatient rehab that the patient now needs a peer to peer review from his insurance company before being considered for inpatient rehab.  That is highly disappointing.  I feel that inpatient rehab should never have been recommended for this patient because it is always difficult to get total joint patients into inpatient rehab.  I do not have the time to call his insurance company today for peer-to-peer review.  I would like to have the transitional care team look now at skilled nursing placement for him.  He still needs to have physical therapy here while he is in the hospital as an inpatient.  At this point I feel that it is fruitless to try to call his insurance company for a peer to peer review at this point for inpatient rehab.  We will just continue to rehab him here in the acute care setting while he is an inpatient and hopefully can get him either to home or skilled nursing by the end of the week.

## 2020-03-31 NOTE — Care Management (Signed)
Per Berneda Rose W/[facility] @[phone].  Co-pay amount for Xarelto 10mg . daily  for 90 day supply @ [facility] $10.00.  No PA required No deductible Tier 2 Retail pharmacy: [facility]

## 2020-03-31 NOTE — Progress Notes (Signed)
Inpatient Rehab Admissions Coordinator:   Notified by patient's insurance company that request for inpatient rehab has been denied.  Pt would like to pursue an expedited appeal, which I will start today.   Will continue to follow for progress with therapy and determination from appeal.   Shann Medal, PT, DPT Admissions Coordinator [phone] 03/31/20  2:29 PM

## 2020-03-31 NOTE — Progress Notes (Addendum)
Physical Therapy Treatment Patient Details Name: Tristan Hamilton MRN: [id number] DOB: 1951/09/03 Today's Date: 03/31/2020    History of Present Illness Pt is a 69 yo male s/p bilateral TKA 4/6 by Dr. Ninfa Linden. PMH includes: osteoarthritis, DVT, GERD, and prostate cancer.    PT Comments    Pt supine in bed on arrival.  He was agreeable to PT session this am.  Pt performed B LE exercises this session in bed with gt belt for support to assist LEs during heel slides and hip abduction.  He is limited due to decreased ROM and decreased quad strength.  Attempted stair training this session with moderate assistance to negotiate x 2 stairs.  He continues to benefit from aggressive rehab in a post acute setting to improve strength, ROM, and function before returning home.      Follow Up Recommendations  CIR;Supervision/Assistance - 24 hour     Equipment Recommendations  Rolling walker with 5" wheels;3in1 (PT)    Recommendations for Other Services Rehab consult     Precautions / Restrictions Precautions Precautions: Fall Restrictions Weight Bearing Restrictions: Yes RLE Weight Bearing: Weight bearing as tolerated LLE Weight Bearing: Weight bearing as tolerated    Mobility  Bed Mobility Overal bed mobility: Needs Assistance Bed Mobility: Supine to Sit     Supine to sit: Supervision     General bed mobility comments: Increased time and effort but no assistance to move to edge of bed.  Transfers Overall transfer level: Needs assistance Equipment used: Rolling walker (2 wheeled) Transfers: Sit to/from Stand Sit to Stand: Min guard;From elevated surface         General transfer comment: Cues for hand placement from elevated surface.  Ambulation/Gait Ambulation/Gait assistance: Min guard Gait Distance (Feet): 50 Feet Assistive device: Rolling walker (2 wheeled) Gait Pattern/deviations: Step-to pattern;Antalgic;Trunk flexed     General Gait Details: Pt with step to pattern  and decreased flexion in knees.  Slow and guarded due to pain with cues for bilateral quad control.   Stairs Stairs: Yes Stairs assistance: Mod assist Stair Management: No rails;Backwards;With walker Number of Stairs: 2 General stair comments: Cues for sequencing and RW placement.  Moderate assistance to negotiate stairs and manage bracing of RW.   Wheelchair Mobility    Modified Rankin (Stroke Patients Only)       Balance Overall balance assessment: Needs assistance Sitting-balance support: No upper extremity supported;Feet supported Sitting balance-Leahy Scale: Good       Standing balance-Leahy Scale: Poor Standing balance comment: reliant on UE support, able to static stand with single UE support                            Cognition Arousal/Alertness: Awake/alert Behavior During Therapy: WFL for tasks assessed/performed;Flat affect Overall Cognitive Status: Within Functional Limits for tasks assessed                                 General Comments: Pt reports being left on the commode yesterday after no one responded to his call light.  His demeanor was very flat during this session.      Exercises Total Joint Exercises Quad Sets: AROM;Both;10 reps;Seated Heel Slides: AAROM;Both;10 reps;Supine;Limitations Heel Slides Limitations: R knee lacks ROM Hip ABduction/ADduction: AAROM;Both;10 reps;Supine Straight Leg Raises: AAROM;Both;10 reps;Supine    General Comments        Pertinent Vitals/Pain Pain Assessment: Faces Faces Pain Scale:  Hurts even more Pain Location: bilateral knees Pain Descriptors / Indicators: Aching;Sore Pain Intervention(s): Monitored during session;Repositioned    Home Living                      Prior Function            PT Goals (current goals can now be found in the care plan section) Acute Rehab PT Goals Patient Stated Goal: to return to independence Potential to Achieve Goals: Good Progress  towards PT goals: Progressing toward goals    Frequency    7X/week      PT Plan Current plan remains appropriate    Co-evaluation              AM-PAC PT "6 Clicks" Mobility   Outcome Measure  Help needed turning from your back to your side while in a flat bed without using bedrails?: A Little Help needed moving from lying on your back to sitting on the side of a flat bed without using bedrails?: A Little Help needed moving to and from a bed to a chair (including a wheelchair)?: A Little Help needed standing up from a chair using your arms (e.g., wheelchair or bedside chair)?: A Little Help needed to walk in hospital room?: A Little Help needed climbing 3-5 steps with a railing? : A Lot 6 Click Score: 17    End of Session Equipment Utilized During Treatment: Gait belt Activity Tolerance: Patient tolerated treatment well Patient left: with call bell/phone within reach;in chair Nurse Communication: Mobility status PT Visit Diagnosis: Difficulty in walking, not elsewhere classified (R26.2);Muscle weakness (generalized) (M62.81);Pain Pain - Right/Left: (Bilateral) Pain - part of body: Knee     Time: [id number] PT Time Calculation (min) (ACUTE ONLY): 39 min  Charges:  $Gait Training: 8-22 mins $Therapeutic Exercise: 8-22 mins $Therapeutic Activity: 8-22 mins                     Tristan Hamilton , PTA Acute Rehabilitation Services Pager [phone] Office [phone]     Tristan Hamilton Tristan Hamilton 03/31/2020, 5:09 PM

## 2020-03-31 NOTE — Progress Notes (Signed)
Inpatient Rehab Admissions Coordinator:   Insurance company requesting peer to peer. Notified Dr. Trevor Mace office to get a message to him.  Will continue to follow.   Shann Medal, PT, DPT Admissions Coordinator [phone] 03/31/20  10:14 AM

## 2020-04-01 NOTE — TOC Initial Note (Signed)
Transition of Care [facility]) - Initial/Assessment Note    Patient Details  Name: Rajon Neitzke MRN: [id number] Date of Birth: 1951-07-28  [facility]) CM/SW Contact:    Sharin Mons, RN Phone Number: [phone] 04/01/2020, 2:43 PM  Clinical Narrative:    Admitted for bilaterally total knee arthroplasty. From home with wife.        - s/p Bilateral total knee arthroplasties,4/6   Veron Posso (Spouse)     [phone]         Pt denied for inpatient rehab... expedited appeal in process. Pt refusing SNF placement. Per MD pt needs to work with therapy more to ensure a safe d/c to home.    HH services with  [facility] in place  [facility] team will continue to monitor and follow for needs   Expected Discharge Plan: [place] Barriers to Discharge: Continued Medical Work up(Per MD more therapy needed to ensure safe d/c to home)   Patient Goals and CMS Choice     Choice offered to / list presented to : Patient  Expected Discharge Plan and Services Expected Discharge Plan: [place]   Discharge Planning Services: CM Consult   Living arrangements for the past 2 months: Single Family Home Expected Discharge Date: 03/27/20                                    Prior Living Arrangements/Services Living arrangements for the past 2 months: Single Family Home Lives with:: Spouse                   Activities of Daily Living Home Assistive Devices/Equipment: None ADL Screening (condition at time of admission) Patient's cognitive ability adequate to safely complete daily activities?: Yes Is the patient deaf or have difficulty hearing?: No Does the patient have difficulty seeing, even when wearing glasses/contacts?: No Does the patient have difficulty concentrating, remembering, or making decisions?: No Patient able to express need for assistance with ADLs?: Yes Does the patient have difficulty dressing or bathing?:  No Independently performs ADLs?: Yes (appropriate for developmental age) Does the patient have difficulty walking or climbing stairs?: Yes Weakness of Legs: Both Weakness of Arms/Hands: None  Permission Sought/Granted                  Emotional Assessment              Admission diagnosis:  OA (osteoarthritis) of knee [M17.10] Status post total bilateral knee replacement [Z96.653] Patient Active Problem List   Diagnosis Date Noted  . OA (osteoarthritis) of knee 03/24/2020  . Status post total bilateral knee replacement 03/24/2020  . Acquired renal cyst of right kidney 01/21/2020  . Prostate cancer ([place]) 11/01/2019  . Enlarged prostate 11/01/2019  . Vitamin D deficiency 11/01/2019  . Unilateral primary osteoarthritis, left knee 10/21/2019  . Unilateral primary osteoarthritis, right knee 10/21/2019  . Bilateral primary osteoarthritis of knee 09/17/2019   PCP:  [name], MD Pharmacy:   [facility] [address] [place] [address] Phone: [phone] Fax: [phone]  [facility] [place] [place] - [address] [place] [address] Phone: [phone] Fax: [phone]     Social Determinants of Health (SDOH) Interventions    Readmission Risk Interventions No flowsheet data found.

## 2020-04-01 NOTE — Progress Notes (Signed)
Patient ID: Tristan Hamilton, male   DOB: August 08, 1951, 69 y.o.   MRN: [id number] I did see that a discharge been put in for this patient on Friday because we were told that the patient will be heading to inpatient rehab and you have to have a discharge from the inpatient acute care setting before there admitted to inpatient rehab.  So obviously, the patient was not discharged to inpatient rehab and stayed in acute care setting because we have not been able to safely discharge him to somewhere due to the therapist recommendation that he still needed further rehab.  In order to safely discharge him to home, we need notes from therapy on the chart stating that he is safe to be discharged to a home environment instead of inpatient rehab which obviously his insurance is not allowing him to qualify for.

## 2020-04-01 NOTE — Progress Notes (Addendum)
Physical Therapy Treatment Patient Details Name: Tristan Hamilton MRN: [id number] DOB: 05-Dec-1951 Today's Date: 04/01/2020    History of Present Illness Pt is a 69 yo male s/p bilateral TKA 4/6 by Dr. Ninfa Hamilton. PMH includes: osteoarthritis, DVT, GERD, and prostate cancer.    PT Comments    Pt seated in recliner on arrival.  He continues to progress with PT session and tolerated increased activity this session.  Pt required min-moderate assistance for stair training to simulate entry into his home.  He remains limited in B knee R>L.  Increased pain noted on lateral border in calf on RLE.  His case from CIR remains in an appeal and he would still continue to benefit from CIR therapies but this is not likely. Will inform supervising PT of need for change in recommendations if his case is denied.  Pt will require the equipment listed below and HHPT if he is denied.  Will continue to keep recommendations for CIR as he would benefit greatly from their services.  He is understandable that he may require placement at home if his appeal is denied.    Follow Up Recommendations  CIR;Supervision/Assistance - 24 hour;Other (comment)(will require HHPT if CIR appeal is denied.)     Equipment Recommendations  Rolling walker with 5" wheels;3in1 (PT)    Recommendations for Other Services       Precautions / Restrictions Precautions Precautions: Fall Restrictions Weight Bearing Restrictions: Yes RLE Weight Bearing: Weight bearing as tolerated LLE Weight Bearing: Weight bearing as tolerated    Mobility  Bed Mobility Overal bed mobility: Needs Assistance         Sit to supine: Supervision   General bed mobility comments: Pt in recliner on arrival, returned to bed post session.  Pt able to lift B LEs back to bed unassisted this session.  Transfers Overall transfer level: Needs assistance Equipment used: Rolling walker (2 wheeled) Transfers: Sit to/from Stand Sit to Stand: Supervision          General transfer comment: Supervision from recliner this session.  Good awareness of hand and foot placement.  Ambulation/Gait Ambulation/Gait assistance: Supervision;Min guard Gait Distance (Feet): 120 Feet Assistive device: Rolling walker (2 wheeled) Gait Pattern/deviations: Antalgic;Trunk flexed;Step-through pattern Gait velocity: reduced   General Gait Details: Cues for gt symmetry and weight shifting.  He has slight buckle x 1 but able to self correct, min guard for safety during buckle.   Stairs Stairs: Yes Stairs assistance: Mod assist;Min assist Stair Management: No rails;Backwards;With walker Number of Stairs: 2 General stair comments: Cues for sequencing and RW placement.  Min assistance ascend and manage bracing of RW, moderate assistance to descend due to LOB forward.   Wheelchair Mobility    Modified Rankin (Stroke Patients Only)       Balance Overall balance assessment: Needs assistance Sitting-balance support: No upper extremity supported;Feet supported Sitting balance-Leahy Scale: Good       Standing balance-Leahy Scale: Fair Standing balance comment: fair to poor- reliant on UEs for support.                            Cognition Arousal/Alertness: Awake/alert Behavior During Therapy: WFL for tasks assessed/performed Overall Cognitive Status: Within Functional Limits for tasks assessed                                 General Comments: Pt appears in better spirits this  session.      Exercises Total Joint Exercises Ankle Circles/Pumps: AROM;Both;20 reps;Seated Quad Sets: AROM;Both;10 reps;Seated Short Arc Quad: AAROM;Both;Supine;10 reps Heel Slides: Both;10 reps;Supine;Limitations;AROM;AAROM Heel Slides Limitations: R knee lacks ROM Hip ABduction/ADduction: Both;10 reps;Supine;AROM    General Comments        Pertinent Vitals/Pain Pain Assessment: Faces Faces Pain Scale: Hurts even more Pain Location: bilateral  knees Pain Descriptors / Indicators: Aching;Sore Pain Intervention(s): Monitored during session;Repositioned    Home Living                      Prior Function            PT Goals (current goals can now be found in the care plan section) Acute Rehab PT Goals Patient Stated Goal: to return to independence Potential to Achieve Goals: Good Progress towards PT goals: Progressing toward goals    Frequency    7X/week      PT Plan Discharge plan needs to be updated    Co-evaluation              AM-PAC PT "6 Clicks" Mobility   Outcome Measure  Help needed turning from your back to your side while in a flat bed without using bedrails?: A Little Help needed moving from lying on your back to sitting on the side of a flat bed without using bedrails?: A Little Help needed moving to and from a bed to a chair (including a wheelchair)?: A Little Help needed standing up from a chair using your arms (e.g., wheelchair or bedside chair)?: A Little Help needed to walk in hospital room?: A Little Help needed climbing 3-5 steps with a railing? : A Lot 6 Click Score: 17    End of Session Equipment Utilized During Treatment: Gait belt Activity Tolerance: Patient tolerated treatment well Patient left: with call bell/phone within reach;in chair Nurse Communication: Mobility status PT Visit Diagnosis: Difficulty in walking, not elsewhere classified (R26.2);Muscle weakness (generalized) (M62.81);Pain Pain - Right/Left: (bilateral) Pain - part of body: Knee     Time: 1139-1222 PT Time Calculation (min) (ACUTE ONLY): 43 min  Charges:  $Gait Training: 8-22 mins $Therapeutic Exercise: 8-22 mins $Therapeutic Activity: 8-22 mins                     Tristan Hamilton , PTA Acute Rehabilitation Services Pager [phone] Office [phone]     Tristan Hamilton Tristan Hamilton 04/01/2020, 1:41 PM

## 2020-04-01 NOTE — Progress Notes (Signed)
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 Ninfa Linden. PMH includes: osteoarthritis, DVT, GERD, and prostate cancer.    PT Comments    Pt continues to progress with gait and mobility.  We practiced the stairs again with wife providing hands on assist throughout the session simulating assist she will need to provide at home.  We spoke at length re: best sitting surface, strategy for getting into and out of the car, being safer having a second person to be behind him when getting into and out of the home.  Plan is for family education around 10 am to go to inpatient rehab unit and practice car transfer.  PT will continue to follow acutely for safe mobility progression.    Follow Up Recommendations  CIR;Follow surgeon's recommendation for DC plan and follow-up therapies(HHPT if CIR denied)     Equipment Recommendations  Rolling walker with 5" wheels;3in1 (PT)    Recommendations for Other Services   NA     Precautions / Restrictions Precautions Precautions: Fall Precaution Comments: orthostatics-no dizziness this session Restrictions Weight Bearing Restrictions: Yes RLE Weight Bearing: Weight bearing as tolerated LLE Weight Bearing: Weight bearing as tolerated    Mobility  Bed Mobility Overal bed mobility: Needs Assistance Bed Mobility: Supine to Sit     Supine to sit: Supervision Sit to supine: Supervision   General bed mobility comments: use of rail for transition and momentum to swing both legs over at once.   Transfers Overall transfer level: Needs assistance Equipment used: Rolling walker (2 wheeled) Transfers: Sit to/from Stand Sit to Stand: Min assist         General transfer comment: Min assist mostly to stabilize RW from recliner chair multiple times and elevated BSC in bathroom.  Wife provided assist for most  transfers to start practicing.   Ambulation/Gait Ambulation/Gait assistance: Min guard Gait Distance (Feet): 100 Feet Assistive device: Rolling walker (2 wheeled) Gait Pattern/deviations: Antalgic;Trunk flexed;Step-through pattern Gait velocity: reduced Gait velocity interpretation: <1.8 ft/sec, indicate of risk for recurrent falls General Gait Details: Cues for heel to toe gait pattern and increased knee flexion during swing.    Stairs Stairs: Yes Stairs assistance: Min assist;+2 safety/equipment Stair Management: No rails;Step to pattern;Backwards;With walker Number of Stairs: 2 General stair comments: visually and verbally reviewed prior to having pt and wife practice with wife stabilizing RW.  Encouraged her to have a second person behind him for safety when going into/out of the house. Min assist from both the front to stabilize RW and back to stabilize for balance when moving RW up the stairs.        Balance Overall balance assessment: Needs assistance Sitting-balance support: No upper extremity supported Sitting balance-Leahy Scale: Good     Standing balance support: Bilateral upper extremity supported;During functional activity Standing balance-Leahy Scale: Fair Standing balance comment: reliant on BUE support                            Cognition Arousal/Alertness: Awake/alert Behavior During Therapy: WFL for tasks assessed/performed Overall Cognitive Status: Within Functional Limits for tasks assessed  General Comments General comments (skin integrity, edema, etc.): wife present throughout session for edication and practice.  She was very hands on to get her more comfortable with handling him.       Pertinent Vitals/Pain Pain Assessment: No/denies pain Pain Score: 1  Pain Location: bilateral knees Pain Descriptors / Indicators: Aching;Sore Pain Intervention(s): Limited activity within  patient's tolerance;Monitored during session;Repositioned;RN gave pain meds during session           PT Goals (current goals can now be found in the care plan section) Acute Rehab PT Goals Patient Stated Goal: to be able to fish again Progress towards PT goals: Progressing toward goals    Frequency    7X/week      PT Plan Current plan remains appropriate       AM-PAC PT "6 Clicks" Mobility   Outcome Measure  Help needed turning from your back to your side while in a flat bed without using bedrails?: A Little Help needed moving from lying on your back to sitting on the side of a flat bed without using bedrails?: A Little Help needed moving to and from a bed to a chair (including a wheelchair)?: A Little Help needed standing up from a chair using your arms (e.g., wheelchair or bedside chair)?: A Little Help needed to walk in hospital room?: A Little Help needed climbing 3-5 steps with a railing? : A Little 6 Click Score: 18    End of Session   Activity Tolerance: Patient limited by pain Patient left: in bed;with call bell/phone within reach;in CPM;with family/visitor present Nurse Communication: Mobility status PT Visit Diagnosis: Difficulty in walking, not elsewhere classified (R26.2);Muscle weakness (generalized) (M62.81);Pain Pain - Right/Left: Right(bil knees) Pain - part of body: Knee     Time: 1601-1701 PT Time Calculation (min) (ACUTE ONLY): 60 min  Charges:  $Gait Training: 38-52 mins $Self Care/Home Management: 8-22                    Verdene Lennert, PT, DPT  Acute Rehabilitation [phone] pager #[phone] office     04/01/2020, 5:11 PM

## 2020-04-01 NOTE — Progress Notes (Signed)
Occupational Therapy Treatment Patient Details Name: Tristan Hamilton MRN: [id number] DOB: Sep 02, 1951 Today's Date: 04/01/2020    History of present illness Pt is a 69 yo male s/p bilateral TKA 4/6 by Dr. Ninfa Linden. PMH includes: osteoarthritis, DVT, GERD, and prostate cancer.   OT comments  Pt progressing toward established goals. He continues to demonstrate limited knee flexion impacting his ability to safely transfer to/from surfaces. Pt requires minA to stabilize when progressing into standing. Pt required minA for shower transfers. Pt and wife are concerned with transfers to/from the car, they drive a sedan and pt has not been able to transfer to/from that low of a surface without increased pain and unsure if pt's wife will be able to provide level of assistance pt requires for this transfer. Pt's wife reports concern when assisting pt with functional mobility during ADL completion. Educated wife with hands on assistance with transfers during today's session.  Based on pt's current level of functioning, pt would greatly benefit from CIR level therapies to maximize safety and independence with ADL/IADL and functional mobility. Currently pending CIR appeal as insurance denied. Should pt discharge home he will benefit from skilled occupational therapy services. Will continue to follow acutely.   Follow Up Recommendations  CIR (if pt pt discharges home, recommend HHOT)   Equipment Recommendations  3 in 1 bedside commode    Recommendations for Other Services      Precautions / Restrictions Precautions Precautions: Fall Restrictions Weight Bearing Restrictions: Yes RLE Weight Bearing: Weight bearing as tolerated LLE Weight Bearing: Weight bearing as tolerated       Mobility Bed Mobility Overal bed mobility: Needs Assistance Bed Mobility: Supine to Sit     Supine to sit: Supervision Sit to supine: Supervision   General bed mobility comments: supervision for safety, pt with good  control of BLE  Transfers Overall transfer level: Needs assistance Equipment used: Rolling walker (2 wheeled) Transfers: Sit to/from Stand Sit to Stand: Min assist         General transfer comment: minA to transfer to/from [facility] and recliner, pt requires assist for stability due to increased forward flexed posture due to decreased knee flexion    Balance Overall balance assessment: Needs assistance Sitting-balance support: No upper extremity supported Sitting balance-Leahy Scale: Good     Standing balance support: Bilateral upper extremity supported;During functional activity Standing balance-Leahy Scale: Fair Standing balance comment: reliant on BUE support                           ADL either performed or assessed with clinical judgement   ADL Overall ADL's : Needs assistance/impaired                         Toilet Transfer: Minimal assistance;Ambulation;RW Toilet Transfer Details (indicate cue type and reason): pt requires forward trunk flexion close to 80* to transfer from toilet  Toileting- Clothing Manipulation and Hygiene: Minimal assistance;Sit to/from stand   Tub/ Banker: Minimal assistance;3 in Mudlogger Details (indicate cue type and reason): simulated transfers in/out of shower Functional mobility during ADLs: Minimal assistance;Rolling walker General ADL Comments: discussed transferring in/out of pt's sedan, due to decreased knee flexion, pt will be at an increased safety risk to transfer to such low surface;pt would benefit from a lateral scoot transfer to/from car, pt reports he might be able to borrow a w/c from a friend      Vision  Perception     Praxis      Cognition Arousal/Alertness: Awake/alert Behavior During Therapy: WFL for tasks assessed/performed Overall Cognitive Status: Within Functional Limits for tasks assessed                                 General Comments:  Pt appears in better spirits this session.        Exercises Total Joint Exercises Ankle Circles/Pumps: AROM;Both;20 reps;Seated Quad Sets: AROM;Both;10 reps;Seated Short Arc Quad: AAROM;Both;Supine;10 reps Heel Slides: Both;10 reps;Supine;Limitations;AROM;AAROM Heel Slides Limitations: R knee lacks ROM Hip ABduction/ADduction: Both;10 reps;Supine;AROM   Shoulder Instructions       General Comments wife present throughout    Pertinent Vitals/ Pain       Pain Assessment: No/denies pain Faces Pain Scale: Hurts even more Pain Location: bilateral knees Pain Descriptors / Indicators: Aching;Sore Pain Intervention(s): Monitored during session  Home Living                                          Prior Functioning/Environment              Frequency  Min 2X/week        Progress Toward Goals  OT Goals(current goals can now be found in the care plan section)  Progress towards OT goals: Progressing toward goals  Acute Rehab OT Goals Patient Stated Goal: to return to independence OT Goal Formulation: With patient Time For Goal Achievement: 04/08/20 Potential to Achieve Goals: Good ADL Goals Pt Will Perform Lower Body Bathing: with min guard assist;sit to/from stand Pt Will Perform Lower Body Dressing: with min guard assist;sit to/from stand Pt Will Perform Toileting - Clothing Manipulation and hygiene: with min guard assist;sit to/from stand Pt Will Perform Tub/Shower Transfer: Shower transfer;with min guard assist;ambulating;shower seat;rolling walker Additional ADL Goal #1: Pt will complete bed mobility at supervision level to prepare for OOB ADLs.  Plan Discharge plan remains appropriate    Co-evaluation                 AM-PAC OT "6 Clicks" Daily Activity     Outcome Measure   Help from another person eating meals?: A Little Help from another person taking care of personal grooming?: A Little Help from another person toileting, which  includes using toliet, bedpan, or urinal?: A Lot Help from another person bathing (including washing, rinsing, drying)?: A Lot Help from another person to put on and taking off regular upper body clothing?: A Little Help from another person to put on and taking off regular lower body clothing?: A Lot 6 Click Score: 15    End of Session Equipment Utilized During Treatment: Gait belt;Rolling walker  OT Visit Diagnosis: Unsteadiness on feet (R26.81);Muscle weakness (generalized) (M62.81)   Activity Tolerance Patient tolerated treatment well   Patient Left in chair;with call bell/phone within reach;with family/visitor present   Nurse Communication Mobility status        Time: [id number] OT Time Calculation (min): 36 min  Charges: OT General Charges $OT Visit: 1 Visit OT Treatments $Self Care/Home Management : 23-37 mins  Helene Kelp OTR/L Acute Rehabilitation Services Office: [place] 04/01/2020, 4:06 PM

## 2020-04-01 NOTE — Progress Notes (Signed)
Patient ID: Tristan Hamilton, male   DOB: 1951-02-12, 69 y.o.   MRN: [id number] The patient is slowly improving with his mobility according to the notes from therapy.  They are still recommending inpatient rehab.  However, his insurance company is reluctant to approve this.  They are wanting a peer to peer review.  I have talked to the patient in length and plan on keeping him here for continued therapy with the goal of hopefully discharging him to home in the next few days.  His vital signs are stable.  His last set of labs are stable.  His chest x-ray yesterday showed just minimal atelectasis.  Both knees are stable as well.

## 2020-04-02 MED FILL — oxyCODONE HCL 5 MG TABS: 5 | 5 days supply | Qty: 30 | Fill #0

## 2020-04-02 MED FILL — METHOCARBAMOL 500 MG TABS: 500 | 15 days supply | Qty: 60 | Fill #0

## 2020-04-02 MED FILL — XARELTO 10 MG TABLET: 10 | 20 days supply | Qty: 20 | Fill #0

## 2020-04-02 NOTE — Progress Notes (Signed)
D/C instructions given , verbalized understanding. Meds delivered from [place]. Down to car via W/C with Therapy.

## 2020-04-02 NOTE — Plan of Care (Signed)
  Problem: Education: Goal: Knowledge of General Education information will improve Description: Including pain rating scale, medication(s)/side effects and non-pharmacologic comfort measures 04/02/2020 1512 by Melina Schools, RN Outcome: Adequate for Discharge 04/02/2020 0748 by Melina Schools, RN Outcome: Progressing

## 2020-04-02 NOTE — Progress Notes (Signed)
Physical Therapy Treatment Patient Details Name: Tristan Hamilton MRN: [id number] DOB: October 28, 1951 Today's Date: 04/02/2020    History of Present Illness Pt is a 69 yo male s/p bilateral TKA 4/6 by Dr. Ninfa Linden. PMH includes: osteoarthritis, DVT, GERD, and prostate cancer.    PT Comments    Pt supine in bed just completed the CPM machine.  He is ready to practice the car transfer.  He required min assistance and max VCs for strategy to move from standing to passengers seat.  He continues to make progress and able to advance gt this session.  Reviewed seated exercises from HEP and applied ice post session.  Will f/u for car transfer to his vehicle is he discharges this pm.      Follow Up Recommendations  CIR;Follow surgeon's recommendation for DC plan and follow-up therapies     Equipment Recommendations  Rolling walker with 5" wheels;3in1 (PT)    Recommendations for Other Services Rehab consult     Precautions / Restrictions Precautions Precautions: Fall Restrictions Weight Bearing Restrictions: Yes RLE Weight Bearing: Weight bearing as tolerated LLE Weight Bearing: Weight bearing as tolerated Other Position/Activity Restrictions: Pt used KI right leg to prevent buckling as this is most painful and weakest leg    Mobility  Bed Mobility Overal bed mobility: Needs Assistance Bed Mobility: Supine to Sit Rolling: Supervision         General bed mobility comments: use of rail for transition and momentum to swing both legs over at once.   Transfers Overall transfer level: Needs assistance Equipment used: Rolling walker (2 wheeled) Transfers: Sit to/from Stand Sit to Stand: Min guard         General transfer comment: Min guard with increased effort to flex knee to position for transfer training.  Ambulation/Gait Ambulation/Gait assistance: Min guard Gait Distance (Feet): 175 Feet Assistive device: Rolling walker (2 wheeled) Gait Pattern/deviations: Antalgic;Trunk  flexed;Step-through pattern Gait velocity: reduced   General Gait Details: Cues for heel to toe gait pattern and increased knee flexion during swing. Cues for heel strike on R to avoid knee buckling.   Stairs             Wheelchair Mobility    Modified Rankin (Stroke Patients Only)       Balance Overall balance assessment: Needs assistance Sitting-balance support: No upper extremity supported Sitting balance-Leahy Scale: Good Sitting balance - Comments: sup   Standing balance support: Bilateral upper extremity supported;During functional activity Standing balance-Leahy Scale: Fair                              Cognition Arousal/Alertness: Awake/alert Behavior During Therapy: WFL for tasks assessed/performed Overall Cognitive Status: Within Functional Limits for tasks assessed                                        Exercises Total Joint Exercises Long Arc Quad: 10 reps;AAROM;Both;Seated Knee Flexion: AAROM;Both;Seated;10 reps Other Exercises Other Exercises: car transfer to low sedan-  pt required cues for hand and foot placement to maneuver into the front seat.  Pt lacks knee flexion and required lifting of hips toward console to move B LEs into the floor board. Other Exercises: seated knee flexion into a quad stretch 1x 15 sec Bilateral.    General Comments        Pertinent Vitals/Pain Pain Assessment: Faces Faces  Pain Scale: Hurts even more Pain Location: bilateral knees Pain Descriptors / Indicators: Aching;Sore Pain Intervention(s): Monitored during session;Repositioned    Home Living                      Prior Function            PT Goals (current goals can now be found in the care plan section) Acute Rehab PT Goals Patient Stated Goal: to be able to fish again Potential to Achieve Goals: Good Progress towards PT goals: Progressing toward goals    Frequency    7X/week      PT Plan Current plan  remains appropriate    Co-evaluation              AM-PAC PT "6 Clicks" Mobility   Outcome Measure  Help needed turning from your back to your side while in a flat bed without using bedrails?: A Little Help needed moving from lying on your back to sitting on the side of a flat bed without using bedrails?: A Little Help needed moving to and from a bed to a chair (including a wheelchair)?: A Little Help needed standing up from a chair using your arms (e.g., wheelchair or bedside chair)?: A Little Help needed to walk in hospital room?: A Little Help needed climbing 3-5 steps with a railing? : A Little 6 Click Score: 18    End of Session Equipment Utilized During Treatment: Gait belt Activity Tolerance: Patient limited by pain Patient left: in bed;with call bell/phone within reach;in CPM;with family/visitor present Nurse Communication: Mobility status PT Visit Diagnosis: Difficulty in walking, not elsewhere classified (R26.2);Muscle weakness (generalized) (M62.81);Pain Pain - Right/Left: Right(bil knees) Pain - part of body: Knee     Time: [id number] PT Time Calculation (min) (ACUTE ONLY): 51 min  Charges:  $Gait Training: 8-22 mins $Therapeutic Exercise: 8-22 mins $Therapeutic Activity: 8-22 mins                     Erasmo Leventhal , PTA Acute Rehabilitation Services Pager [phone] Office [phone]     Brayln Duque [name] 04/02/2020, 2:01 PM

## 2020-04-02 NOTE — Progress Notes (Signed)
Physical Therapy Treatment Patient Details Name: Tristan Hamilton MRN: [id number] DOB: June 17, 1951 Today's Date: 04/02/2020    History of Present Illness Pt is a 69 yo male s/p bilateral TKA 4/6 by Dr. Ninfa Linden. PMH includes: osteoarthritis, DVT, GERD, and prostate cancer.    PT Comments    PTA returned to assist patient with transfer training into his car.  Pt performed with min guard and mod VCs for problem solving to enter his car.     Follow Up Recommendations  CIR;Follow surgeon's recommendation for DC plan and follow-up therapies     Equipment Recommendations  Rolling walker with 5" wheels;3in1 (PT)    Recommendations for Other Services Rehab consult     Precautions / Restrictions Precautions Precautions: Fall Precaution Comments:  Restrictions Weight Bearing Restrictions: Yes RLE Weight Bearing: Weight bearing as tolerated LLE Weight Bearing: Weight bearing as tolerated    Mobility  Bed Mobility               General bed mobility comments: Pt in recliner on arrival.  Transfers Overall transfer level: Needs assistance Equipment used: Rolling walker (2 wheeled) Transfers: Sit to/from Stand Sit to Stand: Min guard;Min assist         General transfer comment: Min guard with increased effort to flex knee to position for transfer training. Performed from recliner > transport chair> car.  Pt required cues for hand and foot placement to move into the passenger seat of the sedan.  Increased time and effort but able to do so safely.  Ambulation/Gait Ambulation/Gait assistance: Min guard;Supervision Gait Distance (Feet): 6 Feet(x2 trials.) Assistive device: Rolling walker (2 wheeled) Gait Pattern/deviations: Antalgic;Trunk flexed;Step-through pattern Gait velocity: reduced   General Gait Details: Cues for heel to toe gait pattern and increased knee flexion during swing. Cues for heel strike on R to avoid knee buckling.   Stairs Stairs: Yes Stairs assistance:  Min assist;+2 safety/equipment Stair Management: No rails;Step to pattern;Backwards;With walker Number of Stairs: 2 General stair comments: Wife performed with patient. They performed technique well.  Minimal cues for safety.   Wheelchair Mobility    Modified Rankin (Stroke Patients Only)       Balance Overall balance assessment: Needs assistance Sitting-balance support: No upper extremity supported Sitting balance-Leahy Scale: Good       Standing balance-Leahy Scale: Fair                              Cognition Arousal/Alertness: Awake/alert Behavior During Therapy: WFL for tasks assessed/performed Overall Cognitive Status: Within Functional Limits for tasks assessed                                        Exercises      General Comments        Pertinent Vitals/Pain Pain Assessment: Faces Pain Score: 6  Faces Pain Scale: Hurts little more Pain Location: bilateral knees Pain Descriptors / Indicators: Aching;Sore Pain Intervention(s): Monitored during session;Repositioned;Premedicated before session    Home Living                      Prior Function            PT Goals (current goals can now be found in the care plan section) Acute Rehab PT Goals Patient Stated Goal: to be able to fish again Potential to Achieve Goals:  Good Progress towards PT goals: Progressing toward goals    Frequency    7X/week      PT Plan Current plan remains appropriate    Co-evaluation              AM-PAC PT "6 Clicks" Mobility   Outcome Measure  Help needed turning from your back to your side while in a flat bed without using bedrails?: A Little Help needed moving from lying on your back to sitting on the side of a flat bed without using bedrails?: A Little Help needed moving to and from a bed to a chair (including a wheelchair)?: A Little Help needed standing up from a chair using your arms (e.g., wheelchair or bedside  chair)?: A Little Help needed to walk in hospital room?: A Little Help needed climbing 3-5 steps with a railing? : A Little 6 Click Score: 18    End of Session Equipment Utilized During Treatment: Gait belt Activity Tolerance: Patient limited by pain Patient left: (in car with wife to drive him home) Nurse Communication: Mobility status PT Visit Diagnosis: Difficulty in walking, not elsewhere classified (R26.2);Muscle weakness (generalized) (M62.81);Pain Pain - Right/Left: Right Pain - part of body: Knee     Time: [id number] PT Time Calculation (min) (ACUTE ONLY): 12 min  Charges:  $Therapeutic Activity: 8-22 mins                     Erasmo Leventhal , PTA Acute Rehabilitation Services Pager [phone] Office [phone]     Neka Bise [name] 04/02/2020, 6:10 PM

## 2020-04-02 NOTE — TOC Transition Note (Signed)
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  Pt will transition to home today. Denied for CIR. Agreeable to [facility] services. [facility] to provided [facility]SOC within 48 hrs. DME ( rolling walker and 3 in 1 / bsc) will be delivered to bedside prior to d/c. Pt has transportation to home.   Final next level of care: [place] Barriers to Discharge: No Barriers Identified   Patient Goals and CMS Choice     Choice offered to / list presented to : Patient  Discharge Placement       Discharge Plan and Services   Discharge Planning Services: CM Consult            DME Arranged: 3-N-1, Walker rolling DME Agency: [employer] Date DME Agency Contacted: 04/02/20 Time DME Agency Contacted: [phone] Representative spoke with at DME Agency: [place]: PT [place]: [facility] (formerly [employer]) Date [place]: 04/02/20 Time [place] Agency: [id number] Representative spoke with at [place]: [place] ([place]) Interventions     Readmission Risk Interventions No flowsheet data found.

## 2020-04-02 NOTE — Progress Notes (Signed)
Patient ID: Tristan Hamilton, male   DOB: 08/22/1951, 69 y.o.   MRN: [id number] Slowly making progress.  Bilateral knees stable.  Patient would like to be discharged to home this afternoon.  Hopefully Transition Care Team can set up HHPT and DME.

## 2020-04-02 NOTE — Plan of Care (Signed)
  Problem: Education: Goal: Knowledge of General Education information will improve Description Including pain rating scale, medication(s)/side effects and non-pharmacologic comfort measures Outcome: Progressing   

## 2020-04-02 NOTE — Discharge Instructions (Signed)
Information on my medicine - XARELTO (Rivaroxaban)  This medication education was reviewed with me or my healthcare representative as part of my discharge preparation.    Why was Xarelto prescribed for you? Xarelto was prescribed for you to reduce the risk of blood clots forming after orthopedic surgery. The medical term for these abnormal blood clots is venous thromboembolism (VTE).  What do you need to know about xarelto ? Take your Xarelto ONCE DAILY at the same time every day. You may take it either with or without food.  If you have difficulty swallowing the tablet whole, you may crush it and mix in applesauce just prior to taking your dose.  Take Xarelto exactly as prescribed by your doctor and DO NOT stop taking Xarelto without talking to the doctor who prescribed the medication.  Stopping without other VTE prevention medication to take the place of Xarelto may increase your risk of developing a clot.  After discharge, you should have regular check-up appointments with your healthcare provider that is prescribing your Xarelto.    What do you do if you miss a dose? If you miss a dose, take it as soon as you remember on the same day then continue your regularly scheduled once daily regimen the next day. Do not take two doses of Xarelto on the same day.   Important Safety Information A possible side effect of Xarelto is bleeding. You should call your healthcare provider right away if you experience any of the following: ? Bleeding from an injury or your nose that does not stop. ? Unusual colored urine (red or dark brown) or unusual colored stools (red or black). ? Unusual bruising for unknown reasons. ? A serious fall or if you hit your head (even if there is no bleeding).  Some medicines may interact with Xarelto and might increase your risk of bleeding while on Xarelto. To help avoid this, consult your healthcare provider or pharmacist prior to using any new prescription  or non-prescription medications, including herbals, vitamins, non-steroidal anti-inflammatory drugs (NSAIDs) and supplements.  This website has more information on Xarelto: [email or web address]  INSTRUCTIONS AFTER JOINT REPLACEMENT   o Remove items at home which could result in a fall. This includes throw rugs or furniture in walking pathways o ICE to the affected joint every three hours while awake for 30 minutes at a time, for at least the first 3-5 days, and then as needed for pain and swelling.  Continue to use ice for pain and swelling. You may notice swelling that will progress down to the foot and ankle.  This is normal after surgery.  Elevate your leg when you are not up walking on it.   o Continue to use the breathing machine you got in the hospital (incentive spirometer) which will help keep your temperature down.  It is common for your temperature to cycle up and down following surgery, especially at night when you are not up moving around and exerting yourself.  The breathing machine keeps your lungs expanded and your temperature down.   DIET:  As you were doing prior to hospitalization, we recommend a well-balanced diet.  DRESSING / WOUND CARE / SHOWERING  Keep the surgical dressing until follow up.  The dressing is water proof, so you can shower without any extra covering.  IF THE DRESSING FALLS OFF or the wound gets wet inside, change the dressing with sterile gauze.  Please use good hand washing techniques before changing the dressing.  Do not use any  lotions or creams on the incision until instructed by your surgeon.    ACTIVITY  o Increase activity slowly as tolerated, but follow the weight bearing instructions below.   o No driving for 6 weeks or until further direction given by your physician.  You cannot drive while taking narcotics.  o No lifting or carrying greater than 10 lbs. until further directed by your surgeon. o Avoid periods of inactivity such as sitting longer than  an hour when not asleep. This helps prevent blood clots.  o You may return to work once you are authorized by your doctor.     WEIGHT BEARING   Weight bearing as tolerated with assist device (walker, cane, etc) as directed, use it as long as suggested by your surgeon or therapist, typically at least 4-6 weeks.   EXERCISES  Results after joint replacement surgery are often greatly improved when you follow the exercise, range of motion and muscle strengthening exercises prescribed by your doctor. Safety measures are also important to protect the joint from further injury. Any time any of these exercises cause you to have increased pain or swelling, decrease what you are doing until you are comfortable again and then slowly increase them. If you have problems or questions, call your caregiver or physical therapist for advice.   Rehabilitation is important following a joint replacement. After just a few days of immobilization, the muscles of the leg can become weakened and shrink (atrophy).  These exercises are designed to build up the tone and strength of the thigh and leg muscles and to improve motion. Often times heat used for twenty to thirty minutes before working out will loosen up your tissues and help with improving the range of motion but do not use heat for the first two weeks following surgery (sometimes heat can increase post-operative swelling).   These exercises can be done on a training (exercise) mat, on the floor, on a table or on a bed. Use whatever works the best and is most comfortable for you.    Use music or television while you are exercising so that the exercises are a pleasant break in your day. This will make your life better with the exercises acting as a break in your routine that you can look forward to.   Perform all exercises about fifteen times, three times per day or as directed.  You should exercise both the operative leg and the other leg as well.  Exercises  include:   . Quad Sets - Tighten up the muscle on the front of the thigh (Quad) and hold for 5-10 seconds.   . Straight Leg Raises - With your knee straight (if you were given a brace, keep it on), lift the leg to 60 degrees, hold for 3 seconds, and slowly lower the leg.  Perform this exercise against resistance later as your leg gets stronger.  . Leg Slides: Lying on your back, slowly slide your foot toward your buttocks, bending your knee up off the floor (only go as far as is comfortable). Then slowly slide your foot back down until your leg is flat on the floor again.  Glenard Haring Wings: Lying on your back spread your legs to the side as far apart as you can without causing discomfort.  . Hamstring Strength:  Lying on your back, push your heel against the floor with your leg straight by tightening up the muscles of your buttocks.  Repeat, but this time bend your knee to a comfortable  angle, and push your heel against the floor.  You may put a pillow under the heel to make it more comfortable if necessary.   A rehabilitation program following joint replacement surgery can speed recovery and prevent re-injury in the future due to weakened muscles. Contact your doctor or a physical therapist for more information on knee rehabilitation.    CONSTIPATION  Constipation is defined medically as fewer than three stools per week and severe constipation as less than one stool per week.  Even if you have a regular bowel pattern at home, your normal regimen is likely to be disrupted due to multiple reasons following surgery.  Combination of anesthesia, postoperative narcotics, change in appetite and fluid intake all can affect your bowels.   YOU MUST use at least one of the following options; they are listed in order of increasing strength to get the job done.  They are all available over the counter, and you may need to use some, POSSIBLY even all of these options:    Drink plenty of fluids (prune juice may be  helpful) and high fiber foods Colace 100 mg by mouth twice a day  Senokot for constipation as directed and as needed Dulcolax (bisacodyl), take with full glass of water  Miralax (polyethylene glycol) once or twice a day as needed.  If you have tried all these things and are unable to have a bowel movement in the first 3-4 days after surgery call either your surgeon or your primary doctor.    If you experience loose stools or diarrhea, hold the medications until you stool forms back up.  If your symptoms do not get better within 1 week or if they get worse, check with your doctor.  If you experience "the worst abdominal pain ever" or develop nausea or vomiting, please contact the office immediately for further recommendations for treatment.   ITCHING:  If you experience itching with your medications, try taking only a single pain pill, or even half a pain pill at a time.  You can also use Benadryl over the counter for itching or also to help with sleep.   TED HOSE STOCKINGS:  Use stockings on both legs until for at least 2 weeks or as directed by physician office. They may be removed at night for sleeping.  MEDICATIONS:  See your medication summary on the "After Visit Summary" that nursing will review with you.  You may have some home medications which will be placed on hold until you complete the course of blood thinner medication.  It is important for you to complete the blood thinner medication as prescribed.  PRECAUTIONS:  If you experience chest pain or shortness of breath - call 911 immediately for transfer to the hospital emergency department.   If you develop a fever greater that 101 F, purulent drainage from wound, increased redness or drainage from wound, foul odor from the wound/dressing, or calf pain - CONTACT YOUR SURGEON.                                                   FOLLOW-UP APPOINTMENTS:  If you do not already have a post-op appointment, please call the office for an  appointment to be seen by your surgeon.  Guidelines for how soon to be seen are listed in your "After Visit Summary", but are typically between  1-4 weeks after surgery.  OTHER INSTRUCTIONS:   Knee Replacement:  Do not place pillow under knee, focus on keeping the knee straight while resting. CPM instructions: 0-90 degrees, 2 hours in the morning, 2 hours in the afternoon, and 2 hours in the evening. Place foam block, curve side up under heel at all times except when in CPM or when walking.  DO NOT modify, tear, cut, or change the foam block in any way.   DENTAL ANTIBIOTICS:  In most cases prophylactic antibiotics for Dental procdeures after total joint surgery are not necessary.  Exceptions are as follows:  1. History of prior total joint infection  2. Severely immunocompromised (Organ Transplant, cancer chemotherapy, Rheumatoid biologic meds such as [place])  3. Poorly controlled diabetes (A1C &gt; 8.0, blood glucose over 200)  If you have one of these conditions, contact your surgeon for an antibiotic prescription, prior to your dental procedure.   MAKE SURE YOU:  . Understand these instructions.  . Get help right away if you are not doing well or get worse.    Thank you for letting us be a part of your medical care team.  It is a privilege we respect greatly.  We hope these instructions will help you stay on track for a fast and full recovery!  INSTRUCTIONS AFTER JOINT REPLACEMENT   o Remove items at home which could result in a fall. This includes throw rugs or furniture in walking pathways o ICE to the affected joint every three hours while awake for 30 minutes at a time, for at least the first 3-5 days, and then as needed for pain and swelling.  Continue to use ice for pain and swelling. You may notice swelling that will progress down to the foot and ankle.  This is normal after surgery.  Elevate your leg when you are not up walking on it.   o Continue to use the breathing  machine you got in the hospital (incentive spirometer) which will help keep your temperature down.  It is common for your temperature to cycle up and down following surgery, especially at night when you are not up moving around and exerting yourself.  The breathing machine keeps your lungs expanded and your temperature down.   DIET:  As you were doing prior to hospitalization, we recommend a well-balanced diet.  DRESSING / WOUND CARE / SHOWERING  Keep the surgical dressing until follow up.  The dressing is water proof, so you can shower without any extra covering.  IF THE DRESSING FALLS OFF or the wound gets wet inside, change the dressing with sterile gauze.  Please use good hand washing techniques before changing the dressing.  Do not use any lotions or creams on the incision until instructed by your surgeon.    ACTIVITY  o Increase activity slowly as tolerated, but follow the weight bearing instructions below.   o No driving for 6 weeks or until further direction given by your physician.  You cannot drive while taking narcotics.  o No lifting or carrying greater than 10 lbs. until further directed by your surgeon. o Avoid periods of inactivity such as sitting longer than an hour when not asleep. This helps prevent blood clots.  o You may return to work once you are authorized by your doctor.     WEIGHT BEARING   Weight bearing as tolerated with assist device (walker, cane, etc) as directed, use it as long as suggested by your surgeon or therapist, typically at least  4-6 weeks.   EXERCISES  Results after joint replacement surgery are often greatly improved when you follow the exercise, range of motion and muscle strengthening exercises prescribed by your doctor. Safety measures are also important to protect the joint from further injury. Any time any of these exercises cause you to have increased pain or swelling, decrease what you are doing until you are comfortable again and then slowly  increase them. If you have problems or questions, call your caregiver or physical therapist for advice.   Rehabilitation is important following a joint replacement. After just a few days of immobilization, the muscles of the leg can become weakened and shrink (atrophy).  These exercises are designed to build up the tone and strength of the thigh and leg muscles and to improve motion. Often times heat used for twenty to thirty minutes before working out will loosen up your tissues and help with improving the range of motion but do not use heat for the first two weeks following surgery (sometimes heat can increase post-operative swelling).   These exercises can be done on a training (exercise) mat, on the floor, on a table or on a bed. Use whatever works the best and is most comfortable for you.    Use music or television while you are exercising so that the exercises are a pleasant break in your day. This will make your life better with the exercises acting as a break in your routine that you can look forward to.   Perform all exercises about fifteen times, three times per day or as directed.  You should exercise both the operative leg and the other leg as well.  Exercises include:   . Quad Sets - Tighten up the muscle on the front of the thigh (Quad) and hold for 5-10 seconds.   . Straight Leg Raises - With your knee straight (if you were given a brace, keep it on), lift the leg to 60 degrees, hold for 3 seconds, and slowly lower the leg.  Perform this exercise against resistance later as your leg gets stronger.  . Leg Slides: Lying on your back, slowly slide your foot toward your buttocks, bending your knee up off the floor (only go as far as is comfortable). Then slowly slide your foot back down until your leg is flat on the floor again.  Glenard Haring Wings: Lying on your back spread your legs to the side as far apart as you can without causing discomfort.  . Hamstring Strength:  Lying on your back, push  your heel against the floor with your leg straight by tightening up the muscles of your buttocks.  Repeat, but this time bend your knee to a comfortable angle, and push your heel against the floor.  You may put a pillow under the heel to make it more comfortable if necessary.   A rehabilitation program following joint replacement surgery can speed recovery and prevent re-injury in the future due to weakened muscles. Contact your doctor or a physical therapist for more information on knee rehabilitation.    CONSTIPATION  Constipation is defined medically as fewer than three stools per week and severe constipation as less than one stool per week.  Even if you have a regular bowel pattern at home, your normal regimen is likely to be disrupted due to multiple reasons following surgery.  Combination of anesthesia, postoperative narcotics, change in appetite and fluid intake all can affect your bowels.   YOU MUST use at least one of  the following options; they are listed in order of increasing strength to get the job done.  They are all available over the counter, and you may need to use some, POSSIBLY even all of these options:    Drink plenty of fluids (prune juice may be helpful) and high fiber foods Colace 100 mg by mouth twice a day  Senokot for constipation as directed and as needed Dulcolax (bisacodyl), take with full glass of water  Miralax (polyethylene glycol) once or twice a day as needed.  If you have tried all these things and are unable to have a bowel movement in the first 3-4 days after surgery call either your surgeon or your primary doctor.    If you experience loose stools or diarrhea, hold the medications until you stool forms back up.  If your symptoms do not get better within 1 week or if they get worse, check with your doctor.  If you experience "the worst abdominal pain ever" or develop nausea or vomiting, please contact the office immediately for further recommendations for  treatment.   ITCHING:  If you experience itching with your medications, try taking only a single pain pill, or even half a pain pill at a time.  You can also use Benadryl over the counter for itching or also to help with sleep.   TED HOSE STOCKINGS:  Use stockings on both legs until for at least 2 weeks or as directed by physician office. They may be removed at night for sleeping.  MEDICATIONS:  See your medication summary on the "After Visit Summary" that nursing will review with you.  You may have some home medications which will be placed on hold until you complete the course of blood thinner medication.  It is important for you to complete the blood thinner medication as prescribed.  PRECAUTIONS:  If you experience chest pain or shortness of breath - call 911 immediately for transfer to the hospital emergency department.   If you develop a fever greater that 101 F, purulent drainage from wound, increased redness or drainage from wound, foul odor from the wound/dressing, or calf pain - CONTACT YOUR SURGEON.                                                   FOLLOW-UP APPOINTMENTS:  If you do not already have a post-op appointment, please call the office for an appointment to be seen by your surgeon.  Guidelines for how soon to be seen are listed in your "After Visit Summary", but are typically between 1-4 weeks after surgery.  OTHER INSTRUCTIONS:   Knee Replacement:  Do not place pillow under knee, focus on keeping the knee straight while resting. CPM instructions: 0-90 degrees, 2 hours in the morning, 2 hours in the afternoon, and 2 hours in the evening. Place foam block, curve side up under heel at all times except when in CPM or when walking.  DO NOT modify, tear, cut, or change the foam block in any way.   DENTAL ANTIBIOTICS:  In most cases prophylactic antibiotics for Dental procdeures after total joint surgery are not necessary.  Exceptions are as follows:  1. History of prior total  joint infection  2. Severely immunocompromised (Organ Transplant, cancer chemotherapy, Rheumatoid biologic meds such as [place])  3. Poorly controlled diabetes (A1C &gt; 8.0, blood glucose over  200)  If you have one of these conditions, contact your surgeon for an antibiotic prescription, prior to your dental procedure.   MAKE SURE YOU:  . Understand these instructions.  . Get help right away if you are not doing well or get worse.    Thank you for letting us be a part of your medical care team.  It is a privilege we respect greatly.  We hope these instructions will help you stay on track for a fast and full recovery!

## 2020-04-02 NOTE — Discharge Summary (Signed)
Patient ID: Tristan Hamilton MRN: [id number] DOB/AGE: 07-19-1951 69 y.o.  Admit date: 03/24/2020 Discharge date: 04/02/2020  Admission Diagnoses:  Principal Problem:   Bilateral primary osteoarthritis of knee Active Problems:   OA (osteoarthritis) of knee   Status post total bilateral knee replacement   Discharge Diagnoses:  Same  Past Medical History:  Diagnosis Date  . DVT (deep venous thrombosis) (HCC)    left leg calf, 4 yrs. ago  . Enlarged prostate   . GERD (gastroesophageal reflux disease)   . Headache    h/o of ocular migranes  . Heart murmur   . History of kidney stones   . Osteoarthritis of both knees   . Prostate cancer [facility]) 2013   Physician in [place], had another bx. and was negative    Surgeries: Procedure(s): TOTAL KNEE BILATERAL on 03/24/2020   Consultants:   Discharged Condition: Improved  Hospital Course: Tristan Hamilton is an 69 y.o. male who was admitted 03/24/2020 for operative treatment ofBilateral primary osteoarthritis of knee. Patient has severe unremitting pain that affects sleep, daily activities, and work/hobbies. After pre-op clearance the patient was taken to the operating room on 03/24/2020 and underwent  Procedure(s): TOTAL KNEE BILATERAL.    Patient was given perioperative antibiotics:  Anti-infectives (From admission, onward)   Start     Dose/Rate Route Frequency Ordered Stop   03/24/20 1815  ceFAZolin (ANCEF) IVPB 1 g/50 mL premix     1 g 100 mL/hr over 30 Minutes Intravenous Every 6 hours 03/24/20 1703 03/24/20 2350   03/24/20 1015  ceFAZolin (ANCEF) IVPB 2g/100 mL premix     2 g 200 mL/hr over 30 Minutes Intravenous On call to O.R. 03/24/20 1001 03/24/20 1212       Patient was given sequential compression devices, early ambulation, and chemoprophylaxis to prevent DVT.  Patient benefited maximally from hospital stay and there were no complications.  The patient's hospital stay was long given the fact that we had to wait for days to get  insurance authorization for Inpatient Rehab.  Therapy had recommended consistently inpatient rehab for this patient.  This was help up for days by his insurance.  At the point of discharge, it was felt that he could transition to home with HHPT.  Althought therapy was till recommending CIR, it was anticipated that this would not be approved by his insurance.  Recent vital signs:  Patient Vitals for the past 24 hrs:  BP Temp Temp src Pulse Resp SpO2  04/02/20 0807 107/68 (!) 100.4 F (38 C) Oral 86 16 94 %  04/02/20 0317 124/67 (!) 100.9 F (38.3 C) Oral 84 14 94 %  04/01/20 2112 128/65 99 F (37.2 C) Oral 90 15 100 %  04/01/20 1834 108/63 99.4 F (37.4 C) Oral 86 16 96 %     Recent laboratory studies:  Recent Labs    03/30/20 1944  WBC 9.8  HGB 9.8*  HCT 29.7*  PLT 258     Discharge Medications:   Allergies as of 04/02/2020   No Known Allergies     Medication List    STOP taking these medications   naproxen sodium 220 MG tablet Commonly known as: ALEVE     TAKE these medications   acetaminophen 500 MG tablet Commonly known as: TYLENOL Take 500-1,000 mg by mouth every 6 (six) hours as needed for mild pain.   Cholecalciferol 125 MCG (5000 UT) capsule Take 5,000 Units by mouth daily.   methocarbamol 500 MG tablet Commonly known as:  ROBAXIN Take 1 tablet (500 mg total) by mouth every 6 (six) hours as needed for muscle spasms.   oxyCODONE 5 MG immediate release tablet Commonly known as: Oxy IR/ROXICODONE Take 1-2 tablets (5-10 mg total) by mouth every 4 (four) hours as needed for moderate pain (pain score 4-6).   rivaroxaban 10 MG Tabs tablet Commonly known as: XARELTO Take 1 tablet (10 mg total) by mouth daily with breakfast.   vitamin B-12 1000 MCG tablet Commonly known as: CYANOCOBALAMIN Take 1,000 mcg by mouth daily.            Durable Medical Equipment  (From admission, onward)         Start     Ordered   04/02/20 0720  For home use only DME  Walker rolling  Once    Question Answer Comment  Walker: With 5 Inch Wheels   Patient needs a walker to treat with the following condition Status post total bilateral knee replacement      04/02/20 0719   04/02/20 0720  For home use only DME 3 n 1  Once     04/02/20 0719   03/24/20 1704  DME 3 n 1  Once     03/24/20 1703   03/24/20 1704  DME Walker rolling  Once    Question Answer Comment  Walker: With 5 Inch Wheels   Patient needs a walker to treat with the following condition Status post total bilateral knee replacement      03/24/20 1703          Diagnostic Studies: DG CHEST PORT 1 VIEW  Result Date: 03/31/2020 CLINICAL DATA:  Fever following total knee replacement EXAM: PORTABLE CHEST 1 VIEW COMPARISON:  None. FINDINGS: There is mild right base atelectasis. Lungs elsewhere are clear. Heart size and pulmonary vascularity are normal. No adenopathy. No bone lesions. IMPRESSION: Mild right base atelectasis. Lungs otherwise clear. Cardiac silhouette within normal limits. Electronically Signed   By: Lowella Grip III M.D.   On: 03/31/2020 09:31   DG Knee Left Port  Result Date: 03/24/2020 CLINICAL DATA:  Total knee replacement. EXAM: PORTABLE LEFT KNEE - 1-2 VIEW COMPARISON:  Left knee MRI 10/06/2019.  Left knee series 09/17/2019. FINDINGS: Total left knee replacement. Hardware intact. Anatomic alignment. No acute bony abnormality. IMPRESSION: Total left knee replacement with anatomic alignment. Electronically Signed   By: Marcello Moores  Register   On: 03/24/2020 16:08   DG Knee Right Port  Result Date: 03/24/2020 CLINICAL DATA:  69 year old male status post total bilateral knee replacement. EXAM: PORTABLE RIGHT KNEE - 1-2 VIEW COMPARISON:  Right knee radiograph dated 09/17/2019. FINDINGS: There is a total right knee arthroplasty. The arthroplasty components appear intact and in anatomic alignment. There is no acute fracture or dislocation. Postsurgical changes including air within the joint  space and anterior cutaneous clips. IMPRESSION: Total right knee arthroplasty.  No immediate complication. Electronically Signed   By: Anner Crete M.D.   On: 03/24/2020 16:07   ECHOCARDIOGRAM COMPLETE  Result Date: 03/13/2020    ECHOCARDIOGRAM REPORT   Patient Name:   KREIG DOCKEN Date of Exam: 03/12/2020 Medical Rec #:  [id number]      Height:       70.0 in Accession #:    [id number]     Weight:       199.2 lb Date of Birth:  02-Dec-1951       BSA:          2.084 m Patient Age:  69 years       BP:           146/83 mmHg Patient Gender: M              HR:           58 bpm. Exam Location:  Outpatient Procedure: 2D Echo, 3D Echo, Cardiac Doppler, Color Doppler and Strain Analysis Indications:    R00.1 Bradycardia, unspecified; Z01.818 Encounter for other                 preprocedural examination  History:        Patient has no prior history of Echocardiogram examinations.                 Abnormal ECG and Palpitations. Cancer.  Sonographer:    Roseanna Rainbow Referring Phys: [id number] Brooten  1. Left ventricular ejection fraction, by estimation, is 60 to 65%. The left ventricle has normal function. The left ventricle has no regional wall motion abnormalities. There is mild left ventricular hypertrophy. Left ventricular diastolic parameters were normal.  2. Right ventricular systolic function is normal. The right ventricular size is normal. There is mildly elevated pulmonary artery systolic pressure. The estimated right ventricular systolic pressure is Q000111Q mmHg.  3. The mitral valve is normal in structure. Trivial mitral valve regurgitation.  4. The aortic valve is tricuspid. Aortic valve regurgitation is trivial. Mild aortic valve sclerosis is present, with no evidence of aortic valve stenosis.  5. The inferior vena cava is normal in size with <50% respiratory variability, suggesting right atrial pressure of 8 mmHg. FINDINGS  Left Ventricle: Left ventricular ejection fraction, by  estimation, is 60 to 65%. The left ventricle has normal function. The left ventricle has no regional wall motion abnormalities. The left ventricular internal cavity size was normal in size. There is  mild left ventricular hypertrophy. Left ventricular diastolic parameters were normal. Right Ventricle: The right ventricular size is normal. No increase in right ventricular wall thickness. Right ventricular systolic function is normal. There is mildly elevated pulmonary artery systolic pressure. The tricuspid regurgitant velocity is 2.62  m/s, and with an assumed right atrial pressure of 8 mmHg, the estimated right ventricular systolic pressure is Q000111Q mmHg. Left Atrium: Left atrial size was normal in size. Right Atrium: Right atrial size was normal in size. Pericardium: There is no evidence of pericardial effusion. Mitral Valve: The mitral valve is normal in structure. Trivial mitral valve regurgitation. Tricuspid Valve: The tricuspid valve is normal in structure. Tricuspid valve regurgitation is trivial. Aortic Valve: The aortic valve is tricuspid. Aortic valve regurgitation is trivial. Mild aortic valve sclerosis is present, with no evidence of aortic valve stenosis. Pulmonic Valve: The pulmonic valve was not well visualized. Pulmonic valve regurgitation is not visualized. Aorta: The aortic root and ascending aorta are structurally normal, with no evidence of dilitation. Venous: The inferior vena cava is normal in size with less than 50% respiratory variability, suggesting right atrial pressure of 8 mmHg. IAS/Shunts: No atrial level shunt detected by color flow Doppler.  LEFT VENTRICLE PLAX 2D LVIDd:         4.60 cm     Diastology LVIDs:         2.80 cm     LV e' lateral:   10.30 cm/s LV PW:         1.30 cm     LV E/e' lateral: 6.8 LV IVS:        1.30 cm  LV e' medial:    9.79 cm/s LVOT diam:     1.90 cm     LV E/e' medial:  7.2 LV SV:         66 LV SV Index:   32 LVOT Area:     2.84 cm  LV Volumes (MOD) LV vol  d, MOD A2C: 40.6 ml LV vol d, MOD A4C: 84.2 ml LV vol s, MOD A2C: 32.4 ml LV vol s, MOD A4C: 36.6 ml LV SV MOD A2C:     8.2 ml LV SV MOD A4C:     84.2 ml LV SV MOD BP:      11.7 ml RIGHT VENTRICLE             IVC RV S prime:     13.20 cm/s  IVC diam: 1.90 cm TAPSE (M-mode): 2.5 cm LEFT ATRIUM             Index       RIGHT ATRIUM           Index LA diam:        4.00 cm 1.92 cm/m  RA Area:     17.80 cm LA Vol (A2C):   66.8 ml 32.06 ml/m RA Volume:   49.30 ml  23.66 ml/m LA Vol (A4C):   48.3 ml 23.18 ml/m LA Biplane Vol: 56.2 ml 26.97 ml/m  AORTIC VALVE LVOT Vmax:   122.00 cm/s LVOT Vmean:  71.800 cm/s LVOT VTI:    0.233 m  AORTA Ao Root diam: 3.20 cm Ao Asc diam:  3.50 cm MITRAL VALVE               TRICUSPID VALVE MV Area (PHT): 2.22 cm    TR Peak grad:   27.5 mmHg MV Decel Time: 342 msec    TR Vmax:        262.00 cm/s MV E velocity: 70.30 cm/s MV A velocity: 55.30 cm/s  SHUNTS MV E/A ratio:  1.27        Systemic VTI:  0.23 m                            Systemic Diam: 1.90 cm Oswaldo Milian MD Electronically signed by Oswaldo Milian MD Signature Date/Time: 03/13/2020/1:28:00 AM    Final     Disposition: Discharge disposition: 01-Home or Self Care         Follow-up Information    [name], MD Follow up in 2 week(s).   Specialty: Orthopedic Surgery Contact information: [address] [place] [address] [phone]            Signed: [name] 04/02/2020, 10:57 AM

## 2020-04-02 NOTE — Progress Notes (Signed)
Physical Therapy Treatment Patient Details Name: Tristan Hamilton MRN: [id number] DOB: 12-10-1951 Today's Date: 04/02/2020    History of Present Illness Pt is a 69 yo male s/p bilateral TKA 4/6 by Dr. Ninfa Linden. PMH includes: osteoarthritis, DVT, GERD, and prostate cancer.    PT Comments    Pt seated in recliner. Pt reports he has been doing exercises and able to verbalize recall of technique.  Focused on repeated trial of stair training with wife present. Pt is progressing well.  He continues to benefit from aggressive rehab in CIR setting but based on denial he will d/c home today with HHPT.   Will return when nurse has completed d/c to assist with transfer into his vehicle.     Follow Up Recommendations  CIR;Follow surgeon's recommendation for DC plan and follow-up therapies     Equipment Recommendations  Rolling walker with 5" wheels;3in1 (PT)    Recommendations for Other Services Rehab consult     Precautions / Restrictions Precautions Precautions: Fall Restrictions Weight Bearing Restrictions: Yes RLE Weight Bearing: Weight bearing as tolerated LLE Weight Bearing: Weight bearing as tolerated Other Position/Activity Restrictions: Pt used KI right leg to prevent buckling as this is most painful and weakest leg    Mobility  Bed Mobility Overal bed mobility: Needs Assistance Bed Mobility: Supine to Sit Rolling: Supervision         General bed mobility comments: Pt in recliner on arrival.  Transfers Overall transfer level: Needs assistance Equipment used: Rolling walker (2 wheeled) Transfers: Sit to/from Stand Sit to Stand: Min guard         General transfer comment: Min guard with increased effort to flex knee to position for transfer training.  Ambulation/Gait Ambulation/Gait assistance: Min guard;Supervision Gait Distance (Feet): 40 Feet Assistive device: Rolling walker (2 wheeled) Gait Pattern/deviations: Antalgic;Trunk flexed;Step-through pattern Gait  velocity: reduced   General Gait Details: Cues for heel to toe gait pattern and increased knee flexion during swing. Cues for heel strike on R to avoid knee buckling.   Stairs Stairs: Yes Stairs assistance: Min assist;+2 safety/equipment Stair Management: No rails;Step to pattern;Backwards;With walker Number of Stairs: 2 General stair comments: Wife performed with patient. They performed technique well.  Minimal cues for safety.   Wheelchair Mobility    Modified Rankin (Stroke Patients Only)       Balance Overall balance assessment: Needs assistance Sitting-balance support: No upper extremity supported Sitting balance-Leahy Scale: Good Sitting balance - Comments: sup   Standing balance support: Bilateral upper extremity supported;During functional activity Standing balance-Leahy Scale: Fair                              Cognition Arousal/Alertness: Awake/alert Behavior During Therapy: WFL for tasks assessed/performed Overall Cognitive Status: Within Functional Limits for tasks assessed                                        Exercises     General Comments        Pertinent Vitals/Pain Pain Assessment: Faces Pain Score: 6  Faces Pain Scale: Hurts even more Pain Location: bilateral knees Pain Descriptors / Indicators: Aching;Sore Pain Intervention(s): Monitored during session;Repositioned    Home Living                      Prior Function  PT Goals (current goals can now be found in the care plan section) Acute Rehab PT Goals Patient Stated Goal: to be able to fish again Potential to Achieve Goals: Good Progress towards PT goals: Progressing toward goals    Frequency    7X/week      PT Plan Current plan remains appropriate    Co-evaluation              AM-PAC PT "6 Clicks" Mobility   Outcome Measure  Help needed turning from your back to your side while in a flat bed without using bedrails?:  A Little Help needed moving from lying on your back to sitting on the side of a flat bed without using bedrails?: A Little Help needed moving to and from a bed to a chair (including a wheelchair)?: A Little Help needed standing up from a chair using your arms (e.g., wheelchair or bedside chair)?: A Little Help needed to walk in hospital room?: A Little Help needed climbing 3-5 steps with a railing? : A Little 6 Click Score: 18    End of Session Equipment Utilized During Treatment: Gait belt Activity Tolerance: Patient limited by pain Patient left: in bed;with call bell/phone within reach;in CPM;with family/visitor present Nurse Communication: Mobility status PT Visit Diagnosis: Difficulty in walking, not elsewhere classified (R26.2);Muscle weakness (generalized) (M62.81);Pain Pain - Right/Left: Right(bil knees) Pain - part of body: Knee     Time: [id number] PT Time Calculation (min) (ACUTE ONLY): 18 min  Charges:  $Gait Training: 8-22 mins                      Tristan Hamilton , PTA Acute Rehabilitation Services Pager [phone] Office [phone]     Tristan Hamilton Tristan Hamilton 04/02/2020, 4:16 PM

## 2020-04-08 ENCOUNTER — Telehealth: Payer: Self-pay | Admitting: Orthopaedic Surgery

## 2020-04-08 MED ORDER — HYDROMORPHONE HCL 4 MG PO TABS: 4.0000 mg | ORAL_TABLET | ORAL | 0 refills | Status: DC | PRN

## 2020-04-08 NOTE — Telephone Encounter (Signed)
Pt called in stating the current oxycodone and tylenol he's taking right now isn't helping and he would like a call back to discuss finding something that might help out a little better.   [phone]

## 2020-04-08 NOTE — Telephone Encounter (Signed)
Patient aware of the below message and I told him to not take the oxycodone with this, one or the other

## 2020-04-08 NOTE — Telephone Encounter (Signed)
I will send him in some Dilaudid to try.  He needs to use these sparingly because they can be highly addicting.

## 2020-04-08 NOTE — Telephone Encounter (Signed)
Please advise 

## 2020-04-13 ENCOUNTER — Other Ambulatory Visit: Payer: Self-pay

## 2020-04-13 DIAGNOSIS — Z96653 Presence of artificial knee joint, bilateral: Secondary | ICD-10-CM

## 2020-04-14 ENCOUNTER — Encounter: Payer: Self-pay | Admitting: Orthopaedic Surgery

## 2020-04-14 ENCOUNTER — Ambulatory Visit (INDEPENDENT_AMBULATORY_CARE_PROVIDER_SITE_OTHER): Payer: Medicare Other | Admitting: Orthopaedic Surgery

## 2020-04-14 ENCOUNTER — Other Ambulatory Visit: Payer: Self-pay

## 2020-04-14 DIAGNOSIS — Z96653 Presence of artificial knee joint, bilateral: Secondary | ICD-10-CM

## 2020-04-14 MED ORDER — OXYCODONE HCL 5 MG PO TABS: 5.0000 mg | ORAL_TABLET | ORAL | 0 refills | Status: DC | PRN

## 2020-04-14 NOTE — Progress Notes (Signed)
The patient comes in today 2 weeks status post bilateral total knee arthroplasties.  He has been having home therapy.  We have had him on Xarelto for blood thinning.  He denies any calf pain and he has notes from therapy showing that he is making excellent progress.  The notes from therapy say he lacks full extension by about 5 degrees on both knees.  1 flexes to 101 and the other to 90 degrees.  On exam both incisions look good so remove the staples in place Steri-Strips.  He understands not to get his knee submerged underwater for at least 2 more weeks.  I will set him up for outpatient physical therapy with [facility].  Both knees lack full extension by just a few degrees and flexion just past 90 degrees.  Both calves are soft.  At this point we will continue pain medication.  Of also encouraged him to stop Xarelto and will start Advil or ibuprofen in a few days from now.  All questions and concerns were answered and addressed.  We will see him back in 4 weeks to see how he is doing from a mobility standpoint and motion standpoint but no x-rays are needed.

## 2020-04-27 ENCOUNTER — Telehealth: Payer: Self-pay | Admitting: Orthopaedic Surgery

## 2020-04-27 NOTE — Telephone Encounter (Signed)
Patient aware of the below  

## 2020-04-27 NOTE — Telephone Encounter (Signed)
Yes, he can drive.

## 2020-04-27 NOTE — Telephone Encounter (Signed)
He good to drive at this point?

## 2020-04-27 NOTE — Telephone Encounter (Signed)
Patient called. He would like to know when he would be able to drive. His call back number is [phone]

## 2020-05-10 NOTE — Progress Notes (Signed)
Cardiology Office Note:    Date:  05/11/2020   ID:  Tristan Hamilton, DOB 1951-04-21, MRN [id number]  PCP:  [name], MD  Cardiologist:  No primary care provider on file.  Electrophysiologist:  None   Referring MD: [name], MD   Chief Complaint  Patient presents with  . Palpitations    History of Present Illness:    Tristan Hamilton is a 69 y.o. male with a hx of prostate cancer, DVT who presents for follow-up.  He was referred by Dr. Ethlyn Gallery for evaluation of bradycardia, initially seen on 03/11/2020.  He was seen by Dr. Ethlyn Gallery for preop evaluation prior to bilateral total knee replacements.  He reported palpitations and Zio patch x2 days was ordered.  Zio patch showed sinus bradycardia down to heart rate 35 bpm.  Patient reports that he is very active.  Recently moved to [place] and has been doing multiple projects on his house and his daughter's house.  Has been cutting down trees and carrying wood.  He denies any exertional chest pain or dyspnea.  He can walk up 2 flights of stairs without stopping.  Reports that the symptoms he felt when he wore his monitor were that his heart was pounding.  He denies any lightheadedness, syncope or presyncope, chest pain, or dyspnea.  He has a history of an incidental DVT that was found in his left knee years ago, he states that he took a blood thinner for 6 months or so, and has not had recurrence.  No smoking history.  No history of heart disease in his immediate family.  TTE was done on 03/12/2020, which showed normal biventricular function, no significant valvular disease.  Since last clinic visit, he reports he has been doing well.  Denies any lightheadedness, syncope, chest pain, or dyspnea.  Does report he gets occasional palpitations, lasting few minutes and resolved.  Reports his knee surgeries went well, denies any complications.  He continues to do physical therapy and is recovering well.   Past Medical History:    Diagnosis Date  . DVT (deep venous thrombosis) (HCC)    left leg calf, 4 yrs. ago  . Enlarged prostate   . GERD (gastroesophageal reflux disease)   . Headache    h/o of ocular migranes  . Heart murmur   . History of kidney stones   . Osteoarthritis of both knees   . Prostate cancer [facility]) 2013   Physician in [place], had another bx. and was negative    Past Surgical History:  Procedure Laterality Date  . CYSTOSCOPY WITH INSERTION OF UROLIFT    . FINGER SURGERY Right    middle finger- got stitches  . right knee scope  1975  . skiing accident     stitches in head  . TESTICLE REMOVAL Left    undescended  . TONSILLECTOMY    . TOTAL KNEE ARTHROPLASTY Bilateral 03/24/2020  . TOTAL KNEE ARTHROPLASTY Bilateral 03/24/2020   Procedure: TOTAL KNEE BILATERAL;  Surgeon: [name], MD;  Location: [place];  Service: Orthopedics;  Laterality: Bilateral;  . VASECTOMY      Current Medications: Current Meds  Medication Sig  . acetaminophen (TYLENOL) 500 MG tablet Take 500-1,000 mg by mouth every 6 (six) hours as needed for mild pain.  . Cholecalciferol 125 MCG (5000 UT) capsule Take 5,000 Units by mouth daily.   . methocarbamol (ROBAXIN) 500 MG tablet Take 1 tablet (500 mg total) by mouth every 6 (six) hours as needed  for muscle spasms.  . vitamin B-12 (CYANOCOBALAMIN) 1000 MCG tablet Take 1,000 mcg by mouth daily.     Allergies:   Patient has no known allergies.   Social History   Socioeconomic History  . Marital status: Married    Spouse name: Not on file  . Number of children: Not on file  . Years of education: Not on file  . Highest education level: Not on file  Occupational History  . Not on file  Tobacco Use  . Smoking status: Never Smoker  . Smokeless tobacco: Never Used  Substance and Sexual Activity  . Alcohol use: Never  . Drug use: Never  . Sexual activity: Not on file  Other Topics Concern  . Not on file  Social History Narrative  . Not on file   Social  Determinants of Health   Financial Resource Strain:   . Difficulty of Paying Living Expenses:   Food Insecurity:   . Worried About Charity fundraiser in the Last Year:   . Arboriculturist in the Last Year:   Transportation Needs:   . Film/video editor (Medical):   Marland Kitchen Lack of Transportation (Non-Medical):   Physical Activity:   . Days of Exercise per Week:   . Minutes of Exercise per Session:   Stress:   . Feeling of Stress :   Social Connections:   . Frequency of Communication with Friends and Family:   . Frequency of Social Gatherings with Friends and Family:   . Attends Religious Services:   . Active Member of Clubs or Organizations:   . Attends Archivist Meetings:   Marland Kitchen Marital Status:      Family History: The patient's family history includes Diverticulitis in his mother; Healthy in his sister; Penile cancer in his father; Prostate cancer in his maternal grandfather.  ROS:   Please see the history of present illness.     All other systems reviewed and are negative.  EKGs/Labs/Other Studies Reviewed:    The following studies were reviewed today:   EKG:  EKG is not ordered today.  The ekg ordered most recently demonstrates sinus bradycardia, rate 55, no ST/T nabormalities  Cardiac monitor 02/24/20:  Sinus bradycardia, normal sinus rhythm and sinus tachycardia. The average heart rate was 63 bpm and ranged from 35-137  Occasional PVCs, bigeminal PVCs, trigeminal PVCs  Occasional PACs and nonsustained atrial tachycardia up to 4 beats.  Patient symptomatic during bradycardia with HR as low as 35bpm during awake hours.   TTE 03/12/20:  1. Left ventricular ejection fraction, by estimation, is 60 to 65%. The  left ventricle has normal function. The left ventricle has no regional  wall motion abnormalities. There is mild left ventricular hypertrophy.  Left ventricular diastolic parameters  were normal.  2. Right ventricular systolic function is normal.  The right ventricular  size is normal. There is mildly elevated pulmonary artery systolic  pressure. The estimated right ventricular systolic pressure is [id number] mmHg.  3. The mitral valve is normal in structure. Trivial mitral valve  regurgitation.  4. The aortic valve is tricuspid. Aortic valve regurgitation is trivial.  Mild aortic valve sclerosis is present, with no evidence of aortic valve  stenosis.  5. The inferior vena cava is normal in size with <50% respiratory  variability, suggesting right atrial pressure of 8 mmHg.    Recent Labs: 02/05/2020: ALT 24; Magnesium 2.0; TSH 1.13 03/26/2020: BUN 12; Creatinine, Ser 1.09; Potassium 4.2; Sodium 135 03/30/2020: Hemoglobin 9.8;  Platelets 258  Recent Lipid Panel    Component Value Date/Time   CHOL 213 (H) 02/05/2020 1526   TRIG 292 (H) 02/05/2020 1526   HDL 54 02/05/2020 1526   CHOLHDL 3.9 02/05/2020 1526   LDLCALC 117 (H) 02/05/2020 1526    Physical Exam:    VS:  BP (!) 160/82   Pulse 61   Ht 5\' 10"  (1.778 m)   Wt 191 lb (86.6 kg)   SpO2 93%   BMI 27.41 kg/m     Wt Readings from Last 3 Encounters:  05/11/20 191 lb (86.6 kg)  03/24/20 198 lb (89.8 kg)  03/20/20 198 lb 1.6 oz (89.9 kg)     GEN:  Well nourished, well developed in no acute distress HEENT: Normal NECK: No JVD; No carotid bruits CARDIAC: bradycardic, regular, no murmurs, rubs, gallops RESPIRATORY:  Clear to auscultation without rales, wheezing or rhonchi  ABDOMEN: Soft, non-tender, non-distended MUSCULOSKELETAL:  No edema; No deformity  SKIN: Warm and dry NEUROLOGIC:  Alert and oriented x 3 PSYCHIATRIC:  Normal affect   ASSESSMENT:    1. Bradycardia   2. PVC's (premature ventricular contractions)    PLAN:    Sinus bradycardia: Heart rate down to 40s while awake on recent monitor.  There were multiple patient triggered events, which primarily corresponded to normal heart rates but there was one patient triggered event with a heart rate 40 bpm, with  reported symptoms of fluttering/heart racing.  He denies any lightheadedness, syncope, presyncope, fatigue, chest pain, or dyspnea.  As does not appear symptomatic from bradycardia, no indication for pacemaker at this time.  TTE 03/12/2020 showed no structural heart disease.  PVCs: 1.4% of beats on recent monitor.  No structural heart disease on TTE  RTC in 1 year   Medication Adjustments/Labs and Tests Ordered: Current medicines are reviewed at length with the patient today.  Concerns regarding medicines are outlined above.  No orders of the defined types were placed in this encounter.  No orders of the defined types were placed in this encounter.   Patient Instructions  Medication Instructions:  Your physician recommends that you continue on your current medications as directed. Please refer to the Current Medication list given to you today.  *If you need a refill on your cardiac medications before your next appointment, please call your pharmacy*  Follow-Up: At [facility], you and your health needs are our priority.  As part of our continuing mission to provide you with exceptional heart care, we have created designated Provider Care Teams.  These Care Teams include your primary Cardiologist (physician) and Advanced Practice Providers (APPs -  Physician Assistants and Nurse Practitioners) who all work together to provide you with the care you need, when you need it.  We recommend signing up for the patient portal called "MyChart".  Sign up information is provided on this After Visit Summary.  MyChart is used to connect with patients for Virtual Visits (Telemedicine).  Patients are able to view lab/test results, encounter notes, upcoming appointments, etc.  Non-urgent messages can be sent to your provider as well.   To learn more about what you can do with MyChart, go to [email or web address].    Your next appointment:   12 month(s)  The format for your next appointment:   In  Person  Provider:   Oswaldo Milian, MD        Signed, Donato Heinz, MD  05/11/2020 1:46 PM    [place]

## 2020-05-11 ENCOUNTER — Other Ambulatory Visit: Payer: Self-pay

## 2020-05-11 ENCOUNTER — Ambulatory Visit (INDEPENDENT_AMBULATORY_CARE_PROVIDER_SITE_OTHER): Payer: Medicare Other | Admitting: Cardiology

## 2020-05-11 VITALS — BP 160/82 | HR 61 | Ht 70.0 in | Wt 191.0 lb

## 2020-05-11 DIAGNOSIS — R001 Bradycardia, unspecified: Secondary | ICD-10-CM | POA: Diagnosis not present

## 2020-05-11 DIAGNOSIS — I493 Ventricular premature depolarization: Secondary | ICD-10-CM

## 2020-05-11 NOTE — Patient Instructions (Signed)

## 2020-05-12 ENCOUNTER — Ambulatory Visit (INDEPENDENT_AMBULATORY_CARE_PROVIDER_SITE_OTHER): Payer: Medicare Other | Admitting: Orthopaedic Surgery

## 2020-05-12 ENCOUNTER — Encounter: Payer: Self-pay | Admitting: Orthopaedic Surgery

## 2020-05-12 DIAGNOSIS — Z96653 Presence of artificial knee joint, bilateral: Secondary | ICD-10-CM

## 2020-05-12 NOTE — Progress Notes (Signed)
HPI: Tristan Hamilton returns now 7 weeks status post bilateral total knee arthroplasties.  He is overall doing well.  He is working with physical therapy.  He does note some swelling in both legs.  He feels this came about after reducing his Advil intake.  He is taking no pain medications.  Taking occasional Tylenol.  Has had no fevers chills or shortness of breath.  Physical exam: Bilateral knees full extension right knee flexion passively to 115 degrees left knee to 110 degrees.  Calves are supple nontender.  Surgical incisions healing well no signs of infection.  Ambulates without any assistive device.  Impression: Status post bilateral total knee arthroplasty 7 weeks  Plan: He will continue to work with therapy for range of motion strengthening.  Wear compression hose for swelling during the day.  Follow-up with [name] in 1 month to check his progress no x-rays at that time.  Questions encouraged and answered

## 2020-05-27 ENCOUNTER — Telehealth: Payer: Self-pay | Admitting: Orthopaedic Surgery

## 2020-05-27 NOTE — Telephone Encounter (Signed)
Patient called.   States that he is in a lot of pain wants to know if he can be seen sooner than

## 2020-05-28 ENCOUNTER — Encounter: Payer: Self-pay | Admitting: Physician Assistant

## 2020-05-28 ENCOUNTER — Ambulatory Visit (INDEPENDENT_AMBULATORY_CARE_PROVIDER_SITE_OTHER): Payer: Medicare Other | Admitting: Physician Assistant

## 2020-05-28 ENCOUNTER — Other Ambulatory Visit: Payer: Self-pay

## 2020-05-28 DIAGNOSIS — Z96653 Presence of artificial knee joint, bilateral: Secondary | ICD-10-CM

## 2020-05-28 NOTE — Telephone Encounter (Signed)
Message sent in error

## 2020-05-28 NOTE — Progress Notes (Signed)
HPI [name] returns today 9 weeks 2 days status post bilateral knee replacements.  He comes in 2 weeks early due to pain lateral aspect of the right knee.  Has had no known injury does state today that he got out of the hospital.  Had some pain in the lateral aspect of the knee when getting out of the car.  He notes that the pain has gotten worse with abduction exercises and whenever he stopped these that the pain has gotten better but he still has some pain lateral aspect of the knee that radiates up to the hip at times.  He is having some difficulty with going up and down stairs.  Otherwise his therapy is going well he reports he is bending his knees beyond 120 degrees bilaterally.   Physical exam: General well-developed well-nourished male ambulates without any assistive device.  Bilateral knees good range of motion both knees bending to at least 115 degrees bilaterally.  No instability valgus varus stressing of either knees.  Full extension and approximately 30 degrees of flexion.  No abnormal warmth erythema.  No significant effusion of either knee.  Calves are supple and nontender bilaterally.  Right leg he has tenderness over the right trochanteric region and slightly down the entire IT band no tenderness over the left trochanteric region or down the IT band.  Impression: 9 weeks status post bilateral knee replacements IT band syndrome right leg  Plan: He will stop all abduction exercises.  He will apply Voltaren gel over the lateral aspect of the knee where he has pain up to 4 times daily.  Continue to work on range of motion and quad strengthening.  Follow-up with [name] at his regularly scheduled appointment.  Questions were encouraged and answered at length

## 2020-06-09 ENCOUNTER — Other Ambulatory Visit: Payer: Self-pay

## 2020-06-09 ENCOUNTER — Ambulatory Visit (INDEPENDENT_AMBULATORY_CARE_PROVIDER_SITE_OTHER): Payer: Medicare Other | Admitting: Orthopaedic Surgery

## 2020-06-09 ENCOUNTER — Encounter: Payer: Self-pay | Admitting: Orthopaedic Surgery

## 2020-06-09 DIAGNOSIS — Z96653 Presence of artificial knee joint, bilateral: Secondary | ICD-10-CM

## 2020-06-09 MED ORDER — METHYLPREDNISOLONE 4 MG PO TABS: ORAL_TABLET | ORAL | 0 refills | Status: DC

## 2020-06-09 NOTE — Progress Notes (Signed)
The patient comes in today at 11 weeks status post bilateral knee replacements.  He has made great progress with his mobility and motion of his knees.  It is almost as if he is actually overdoing things.  He is having a lot of IT band pain and grinding on the right knee.  There is some grinding in the left knee but no pain.  On exam both knees have still some swelling to be expected.  His extension is almost completely full in both knees and his flexion is excellent on both knees.  Both knees are ligamentously stable.  I do palpate grinding at the IT band on both right and left knees but the right knee is symptomatic.  I will try a 6-day steroid taper and have him try Voltaren gel in this area.  I am hesitant to try a steroid injection at under 3 months out from knee replacement surgery.  I would like to see him back in 6 weeks.  At that visit I would not hesitate to place a steroid injection around the right knee IT band if needed.  All questions and concerns were answered and addressed.

## 2020-07-21 ENCOUNTER — Encounter: Payer: Self-pay | Admitting: Orthopaedic Surgery

## 2020-07-21 ENCOUNTER — Ambulatory Visit (INDEPENDENT_AMBULATORY_CARE_PROVIDER_SITE_OTHER): Payer: Medicare Other | Admitting: Orthopaedic Surgery

## 2020-07-21 DIAGNOSIS — Z96653 Presence of artificial knee joint, bilateral: Secondary | ICD-10-CM

## 2020-07-21 NOTE — Progress Notes (Signed)
The patient comes in today almost 4 months status post bilateral total knee arthroplasties.  He says he is doing well overall but does have achiness at night.  He says the left knee is little more stiff and swollen he is doing home exercises only and is been released by therapy.  He reports good range of motion of his knees and good satisfaction overall.  On examination both knees have excellent range of motion and stability.  There is a little bit of swelling.  His IT band is calm down on the right side where it was more stiff and painful before.  Some of this is with activity modification.  At this point we do not need to see him back for 6 months unless he is having issues.  At that visit like a standing AP and lateral of both knees.

## 2020-08-21 ENCOUNTER — Telehealth (INDEPENDENT_AMBULATORY_CARE_PROVIDER_SITE_OTHER): Payer: Medicare Other | Admitting: Family Medicine

## 2020-08-21 ENCOUNTER — Encounter: Payer: Self-pay | Admitting: Family Medicine

## 2020-08-21 DIAGNOSIS — B351 Tinea unguium: Secondary | ICD-10-CM

## 2020-08-21 DIAGNOSIS — R059 Cough, unspecified: Secondary | ICD-10-CM

## 2020-08-21 DIAGNOSIS — R05 Cough: Secondary | ICD-10-CM | POA: Diagnosis not present

## 2020-08-21 MED ORDER — CICLOPIROX 8 % EX SOLN: Freq: Every day | CUTANEOUS | 5 refills | Status: DC

## 2020-08-21 NOTE — Progress Notes (Signed)
Virtual Visit via Video Note  I connected with Tristan Hamilton  on 08/21/20 at 11:00 AM EDT by a video enabled telemedicine application and verified that I am speaking with the correct person using two identifiers.  Location patient: home Location provider: [place], [place] [id number] Persons participating in the virtual visit: patient, provider  I discussed the limitations of evaluation and management by telemedicine and the availability of in person appointments. The patient expressed understanding and agreed to proceed.   Tristan Hamilton DOB: 05-29-1951 Encounter date: 08/21/2020  This is a 69 y.o. male who presents with Chief Complaint  Patient presents with  . Nail Problem  . Cough    History of present illness: Concern for toe fungus on one of toes - just discoloration on one foot. Been this way maybe a year. All toes on left foot; some thickening as well. Harder to cut.   Irritating cough - 4-10 times daily. Not much comes up when he coughs. Has been this way for a couple of years. Blows nose in morning, but not through the day. More of clearing throat.   Heart burn just if triggered by certain foods.   Knees doing better.   No Known Allergies No outpatient medications have been marked as taking for the 08/21/20 encounter (Video Visit) with [name], MD.    Review of Systems  Constitutional: Negative for chills, fatigue and fever.  HENT: Negative for postnasal drip, sinus pressure and sinus pain.   Respiratory: Positive for cough. Negative for chest tightness, shortness of breath and wheezing.   Cardiovascular: Negative for chest pain, palpitations and leg swelling.  Gastrointestinal: Negative for abdominal pain.       Occasional heartburn; relieved with pepcid. Rarely takes this.   Skin:       Thickening, yellowing toenails left foot    Objective:  There were no vitals taken for this visit.      BP Readings from  Last 3 Encounters:  05/11/20 (!) 160/82  04/02/20 125/86  03/20/20 130/88   Wt Readings from Last 3 Encounters:  05/11/20 191 lb (86.6 kg)  03/24/20 198 lb (89.8 kg)  03/20/20 198 lb 1.6 oz (89.9 kg)    EXAM:  GENERAL: alert, oriented, appears well and in no acute distress  HEENT: atraumatic, conjunctiva clear, no obvious abnormalities on inspection of external nose and ears  NECK: normal movements of the head and neck  LUNGS: on inspection no signs of respiratory distress, breathing rate appears normal, no obvious gross SOB, gasping or wheezing  CV: no obvious cyanosis  MS: moves all visible extremities without noticeable abnormality  PSYCH/NEURO: pleasant and cooperative, no obvious depression or anxiety, speech and thought processing grossly intact  Skin: toenails left foot thickened, whitened.   Assessment/Plan  1. Onychomycosis Trial topical penlac. Discussed nail care; avoid spreading to other nails. Consider biopsy/oral treatment if not improving.   2. Cough Discussed etiologies for chronic cough including medication, lung issue, acid reflux, postnasal drainage.  On review of systems, sounds like acid reflux may be most likely.  We will repeat a chest x-ray, since on review of prior chest x-ray he did have some atelectasis in bibasilar bases.  I have ordered this and he can walk and to complete when able.  He is going to start taking his Pepcid at least once daily in the evening, maybe twice a day to try and work from acid reflux standpoint.  He will  update me with how he does on this medication in about 1 month's time.  He does have a follow-up already set up with me in 2 months time.  Consider antihistamine if no improvement in cough with Pepcid addition. - DG Chest 2 View; Future    Return if symptoms worsen or fail to improve.   I discussed the assessment and treatment plan with the patient. The patient was provided an opportunity to ask questions and all were  answered. The patient agreed with the plan and demonstrated an understanding of the instructions.   The patient was advised to call back or seek an in-person evaluation if the symptoms worsen or if the condition fails to improve as anticipated.  I provided 30 minutes of non-face-to-face time during this encounter.   Micheline Rough, MD

## 2020-08-31 ENCOUNTER — Ambulatory Visit (INDEPENDENT_AMBULATORY_CARE_PROVIDER_SITE_OTHER)
Admission: RE | Admit: 2020-08-31 | Discharge: 2020-08-31 | Disposition: A | Discharge status: Home / Self Care | Payer: Medicare Other | Source: Ambulatory Visit | Attending: Family Medicine | Admitting: Family Medicine

## 2020-08-31 ENCOUNTER — Other Ambulatory Visit: Payer: Self-pay

## 2020-08-31 DIAGNOSIS — R059 Cough, unspecified: Secondary | ICD-10-CM

## 2020-08-31 DIAGNOSIS — R05 Cough: Secondary | ICD-10-CM | POA: Diagnosis not present

## 2020-09-15 ENCOUNTER — Encounter: Payer: Self-pay | Admitting: Family Medicine

## 2020-10-19 ENCOUNTER — Other Ambulatory Visit: Payer: Self-pay

## 2020-10-19 ENCOUNTER — Encounter: Payer: Self-pay | Admitting: Family Medicine

## 2020-10-19 ENCOUNTER — Ambulatory Visit (INDEPENDENT_AMBULATORY_CARE_PROVIDER_SITE_OTHER): Payer: Medicare Other | Admitting: Family Medicine

## 2020-10-19 VITALS — BP 142/80 | HR 56 | Temp 97.7°F | Ht 70.0 in | Wt 205.4 lb

## 2020-10-19 DIAGNOSIS — Z23 Encounter for immunization: Secondary | ICD-10-CM | POA: Diagnosis not present

## 2020-10-19 DIAGNOSIS — B351 Tinea unguium: Secondary | ICD-10-CM

## 2020-10-19 DIAGNOSIS — Z0000 Encounter for general adult medical examination without abnormal findings: Secondary | ICD-10-CM

## 2020-10-19 DIAGNOSIS — Z8349 Family history of other endocrine, nutritional and metabolic diseases: Secondary | ICD-10-CM | POA: Diagnosis not present

## 2020-10-19 DIAGNOSIS — E785 Hyperlipidemia, unspecified: Secondary | ICD-10-CM

## 2020-10-19 DIAGNOSIS — E1169 Type 2 diabetes mellitus with other specified complication: Secondary | ICD-10-CM | POA: Diagnosis not present

## 2020-10-19 DIAGNOSIS — Z1159 Encounter for screening for other viral diseases: Secondary | ICD-10-CM

## 2020-10-19 DIAGNOSIS — Z86010 Personal history of colonic polyps: Secondary | ICD-10-CM

## 2020-10-19 DIAGNOSIS — Z8546 Personal history of malignant neoplasm of prostate: Secondary | ICD-10-CM | POA: Diagnosis not present

## 2020-10-19 NOTE — Addendum Note (Signed)
Addended by: Agnes Lawrence on: 10/19/2020 10:16 AM   Modules accepted: Orders

## 2020-10-19 NOTE — Progress Notes (Addendum)
Tristan Hamilton DOB: 1951/05/04 Encounter date: 10/19/2020  This is a 69 y.o. male who presents for complete physical   History of present illness/Additional concerns: Terrible at long derm attention to toenails. Needs to go at it again - seemed like it was working.   Due for colonoscopy. Did have some polyps on last one. Thinks last was over 10 years ago.   Did have shingles vaccine about 4 years ago.   Still has cough - has some nasal congestion every morning. Not coughing as badly throughout day. No fevers, chills. Cough not worse any certain time of day. No wheezing, no shortness of breath. Tried taking acid reflux medication daily for a month, but this didn't help. Doesn't note significant drainage unless coughing.   Hx of prostate CA; follows with [facility] for q 6 mo PSA; last bx in Feb 2021 negative.   Past Medical History:  Diagnosis Date  . DVT (deep venous thrombosis) (HCC)    left leg calf, 4 yrs. ago  . Enlarged prostate   . GERD (gastroesophageal reflux disease)   . Headache    h/o of ocular migranes  . Heart murmur   . History of kidney stones   . Osteoarthritis of both knees   . Prostate cancer [facility]) 2013   Physician in [place], had another bx. and was negative   Past Surgical History:  Procedure Laterality Date  . CYSTOSCOPY WITH INSERTION OF UROLIFT    . FINGER SURGERY Right    middle finger- got stitches  . right knee scope  1975  . skiing accident     stitches in head  . TESTICLE REMOVAL Left    undescended  . TONSILLECTOMY    . TOTAL KNEE ARTHROPLASTY Bilateral 03/24/2020  . TOTAL KNEE ARTHROPLASTY Bilateral 03/24/2020   Procedure: TOTAL KNEE BILATERAL;  Surgeon: [name], MD;  Location: [facility];  Service: Orthopedics;  Laterality: Bilateral;  . VASECTOMY     No Known Allergies Current Meds  Medication Sig  . acetaminophen (TYLENOL) 500 MG tablet Take 500-1,000 mg by mouth every 6 (six) hours as needed for mild pain.  . Cholecalciferol  125 MCG (5000 UT) capsule Take 5,000 Units by mouth daily.   . ciclopirox (PENLAC) 8 % solution Apply topically at bedtime. Apply over nail and surrounding skin. Apply daily over previous coat. After seven (7) days, may remove with alcohol and continue cycle.  . naproxen sodium (ALEVE) 220 MG tablet Take 220 mg by mouth as needed.  . vitamin B-12 (CYANOCOBALAMIN) 1000 MCG tablet Take 1,000 mcg by mouth daily.   Social History   Tobacco Use  . Smoking status: Never Smoker  . Smokeless tobacco: Never Used  Substance Use Topics  . Alcohol use: Never   Family History  Problem Relation Age of Onset  . Diverticulitis Mother   . Penile cancer Father   . Healthy Sister   . Prostate cancer Maternal Grandfather      Review of Systems  CBC:  Lab Results  Component Value Date   WBC 9.8 03/30/2020   HGB 9.8 (L) 03/30/2020   HCT 29.7 (L) 03/30/2020   MCH 30.8 03/30/2020   MCHC 33.0 03/30/2020   RDW 12.8 03/30/2020   PLT 258 03/30/2020   MPV 11.1 02/05/2020   CMP: Lab Results  Component Value Date   NA 135 03/26/2020   K 4.2 03/26/2020   CL 100 03/26/2020   CO2 28 03/26/2020   ANIONGAP 7 03/26/2020   GLUCOSE  132 (H) 03/26/2020   BUN 12 03/26/2020   CREATININE 1.09 03/26/2020   CREATININE 0.99 02/05/2020   GFRAA >60 03/26/2020   CALCIUM 8.3 (L) 03/26/2020   PROT 6.5 02/05/2020   BILITOT 0.6 02/05/2020   ALT 24 02/05/2020   AST 22 02/05/2020   LIPID: Lab Results  Component Value Date   CHOL 213 (H) 02/05/2020   TRIG 292 (H) 02/05/2020   HDL 54 02/05/2020   LDLCALC 117 (H) 02/05/2020    Objective:  BP (!) 142/80 (BP Location: Left Arm, Patient Position: Sitting, Cuff Size: Large)   Pulse (!) 56   Temp 97.7 F (36.5 C) (Oral)   Ht 5\' 10"  (1.778 m)   Wt 205 lb 6.4 oz (93.2 kg)   BMI 29.47 kg/m   Weight: 205 lb 6.4 oz (93.2 kg)   BP Readings from Last 3 Encounters:  10/19/20 (!) 142/80  05/11/20 (!) 160/82  04/02/20 125/86   Wt Readings from Last 3  Encounters:  10/19/20 205 lb 6.4 oz (93.2 kg)  05/11/20 191 lb (86.6 kg)  03/24/20 198 lb (89.8 kg)    Physical Exam Constitutional:      General: He is not in acute distress.    Appearance: He is well-developed.  HENT:     Head: Normocephalic and atraumatic.     Right Ear: External ear normal.     Left Ear: External ear normal.     Nose: Nose normal.     Mouth/Throat:     Pharynx: No oropharyngeal exudate.  Eyes:     Conjunctiva/sclera: Conjunctivae normal.     Pupils: Pupils are equal, round, and reactive to light.  Neck:     Thyroid: No thyromegaly.  Cardiovascular:     Rate and Rhythm: Normal rate and regular rhythm.     Heart sounds: Normal heart sounds. No murmur heard.  No friction rub. No gallop.   Pulmonary:     Effort: Pulmonary effort is normal. No respiratory distress.     Breath sounds: Normal breath sounds. No stridor. No wheezing or rales.  Abdominal:     General: Bowel sounds are normal.     Palpations: Abdomen is soft.  Musculoskeletal:        General: Normal range of motion.     Cervical back: Neck supple.  Skin:    General: Skin is warm and dry.  Neurological:     Mental Status: He is alert and oriented to person, place, and time.  Psychiatric:        Behavior: Behavior normal.        Thought Content: Thought content normal.        Judgment: Judgment normal.     Assessment/Plan: There are no preventive care reminders to display for this patient. Health Maintenance reviewed - referring for colonoscopy; bloodwork today. Recommended Tdap at pharmacy. Otherwise up to date with preventative health measures. .  1. Preventative health care Recommend regular exercise. See above.  2. Family history of hemochromatosis Son recently dx with hemochromatosis.  - Comprehensive metabolic panel; Future - Hemochromatosis DNA-PCR(c282y,h63d); Future  3. History of prostate cancer Follows regularly with urology; PSA has been stable.   4. Hyperlipidemia  associated with type 2 diabetes mellitus ([place]) Continue with diet contol. - Lipid panel; Future  5. History of colon polyps - Ambulatory referral to Gastroenterology; Future  6. Encounter for hepatitis C screening test for low risk patient - Hepatitis C antibody; Future  Return in about 1 year (around 10/19/2021)  for physical exam.  Micheline Rough, MD  Monitor blood pressures at home and report back

## 2020-10-19 NOTE — Patient Instructions (Addendum)
Get Tdap at pharmacy.      Why is Exercise Important? If I told you I had a single pill that would help you decrease stress by improving anxiety, decreasing depression, help you achieve a healthy weight, give you more energy, make you more productive, help you focus, decrease your risk of dementia/heart attack/stroke/falls, improve your bone health, and more would you be interested? These are just some of the benefits that exercise brings to you. IT IS WORTH carving out some time every day to fit in exercise. It will help in every aspect of your health. Even if you have injuries that prevent you from participating in a type of exercise you used to do; there is always something that you can do to keep exercise a part of your life. If improving your health is important, make exercise your priority. It is worth the time! If you have questions about the type of exercise that is right for you, please talk with me about this!     Exercising to Stay Healthy  Exercising regularly is important. It has many health benefits, such as:  Improving your overall fitness, flexibility, and endurance.  Increasing your bone density.  Helping with weight control.  Decreasing your body fat.  Increasing your muscle strength.  Reducing stress and tension.  Improving your overall health.   In order to become healthy and stay healthy, it is recommended that you do moderate-intensity and vigorous-intensity exercise. You can tell that you are exercising at a moderate intensity if you have a higher heart rate and faster breathing, but you are still able to hold a conversation. You can tell that you are exercising at a vigorous intensity if you are breathing much harder and faster and cannot hold a conversation while exercising. How often should I exercise? Choose an activity that you enjoy and set realistic goals. Your health care provider can help you to make an activity plan that works for you. Exercise regularly  as directed by your health care provider. This may include:  Doing resistance training twice each week, such as: ? Push-ups. ? Sit-ups. ? Lifting weights. ? Using resistance bands.  Doing a given intensity of exercise for a given amount of time. Choose from these options: ? 150 minutes of moderate-intensity exercise every week. ? 75 minutes of vigorous-intensity exercise every week. ? A mix of moderate-intensity and vigorous-intensity exercise every week.   Children, pregnant women, people who are out of shape, people who are overweight, and older adults may need to consult a health care provider for individual recommendations. If you have any sort of medical condition, be sure to consult your health care provider before starting a new exercise program. What are some exercise ideas? Some moderate-intensity exercise ideas include:  Walking at a rate of 1 mile in 15 minutes.  Biking.  Hiking.  Golfing.  Dancing.   Some vigorous-intensity exercise ideas include:  Walking at a rate of at least 4.5 miles per hour.  Jogging or running at a rate of 5 miles per hour.  Biking at a rate of at least 10 miles per hour.  Lap swimming.  Roller-skating or in-line skating.  Cross-country skiing.  Vigorous competitive sports, such as football, basketball, and soccer.  Jumping rope.  Aerobic dancing.   What are some everyday activities that can help me to get exercise?  Tusculum work, such as: ? Pushing a Conservation officer, nature. ? Raking and bagging leaves.  Washing and waxing your car.  Pushing a stroller.  Shoveling snow.  Gardening.  Washing windows or floors. How can I be more active in my day-to-day activities?  Use the stairs instead of the elevator.  Take a walk during your lunch break.  If you drive, park your car farther away from work or school.  If you take public transportation, get off one stop early and walk the rest of the way.  Make all of your phone calls while  standing up and walking around.  Get up, stretch, and walk around every 30 minutes throughout the day. What guidelines should I follow while exercising?  Do not exercise so much that you hurt yourself, feel dizzy, or get very short of breath.  Consult your health care provider before starting a new exercise program.  Wear comfortable clothes and shoes with good support.  Drink plenty of water while you exercise to prevent dehydration or heat stroke. Body water is lost during exercise and must be replaced.  Work out until you breathe faster and your heart beats faster. This information is not intended to replace advice given to you by your health care provider. Make sure you discuss any questions you have with your health care provider.

## 2020-10-21 ENCOUNTER — Encounter: Payer: Self-pay | Admitting: Family Medicine

## 2020-10-22 ENCOUNTER — Other Ambulatory Visit: Payer: Medicare Other

## 2020-10-22 ENCOUNTER — Other Ambulatory Visit: Payer: Self-pay

## 2020-10-22 DIAGNOSIS — E785 Hyperlipidemia, unspecified: Secondary | ICD-10-CM

## 2020-10-22 DIAGNOSIS — E1169 Type 2 diabetes mellitus with other specified complication: Secondary | ICD-10-CM

## 2020-10-22 DIAGNOSIS — Z8349 Family history of other endocrine, nutritional and metabolic diseases: Secondary | ICD-10-CM

## 2020-10-22 DIAGNOSIS — Z1159 Encounter for screening for other viral diseases: Secondary | ICD-10-CM

## 2020-10-22 NOTE — Addendum Note (Signed)
Addended by: Marrion Coy on: 10/22/2020 09:41 AM   Modules accepted: Orders

## 2020-10-24 NOTE — Telephone Encounter (Signed)
Can you record the normal bp he messaged into our system so we have an updated normal? - I'm not sure best way to put this in? Maybe in telephone note?

## 2020-10-26 ENCOUNTER — Telehealth: Payer: Self-pay | Admitting: *Deleted

## 2020-10-26 NOTE — Progress Notes (Signed)
Please let patient know that per podiatry, laser nasal treatment for fungal infection is not covered. So he can do topical treatment or we can try oral treatment as we have previously discussed.

## 2020-10-26 NOTE — Telephone Encounter (Signed)
11/05/2020--Note from the pts Mychart per PCP's request:  Can you record the normal bp he messaged into our system so we have an updated normal? - I'm not sure best way to put this in? Maybe in telephone note?      Documentation   [name], MD  [name] "Dan" 2 days ago   Thanks for sending me those. They look better than in the office which is good. A couple of your labs are still pending, but I will let you know as soon as they are done. Sorry you had to come back to get them done! Micheline Rough, MD    Damita Dunnings, CMA routed conversation to [name], MD 4 days ago  Tristan Hamilton, Tristan "Dan"  [name], MD 5 days ago  DL 11/2  8:00am 950/93  p-70 11/2  8:30pm 135/76  p-61 11/2 11:00pm 123/62 p-49 11/3   7:30am  126/78 p-45  Your assistant gave me the flu shot and let me go.  Was I supposed to get blood work done?  Do I need to come back?  -[name]

## 2020-10-26 NOTE — Telephone Encounter (Signed)
See telephone encounter per PCP's request.

## 2020-10-27 ENCOUNTER — Telehealth: Payer: Self-pay | Admitting: *Deleted

## 2020-10-27 NOTE — Telephone Encounter (Signed)
Patient informed of the message below.

## 2020-10-27 NOTE — Telephone Encounter (Signed)
-----   Message from [name], MD sent at 10/26/2020  2:07 PM EST -----   ----- Message ----- From: Trula Slade, DPM Sent: 10/21/2020  10:49 AM EST To: [name], MD  Unfortunately laser is not covered by insurance. I have some success with laser. I find that it is better for the nails that are not super thick.   ----- Message ----- From: [name], MD Sent: 10/19/2020   9:43 AM EDT To: Trula Slade, DPM  Is insurance good about covering the laser treatments for onychomycosis and do you have good success with this? For people not wanting/able to take oral or who don't want to try topical?

## 2020-10-30 ENCOUNTER — Ambulatory Visit (INDEPENDENT_AMBULATORY_CARE_PROVIDER_SITE_OTHER): Payer: Medicare Other | Admitting: Sports Medicine

## 2020-10-30 ENCOUNTER — Other Ambulatory Visit: Payer: Self-pay

## 2020-10-30 ENCOUNTER — Encounter: Payer: Self-pay | Admitting: Sports Medicine

## 2020-10-30 DIAGNOSIS — L603 Nail dystrophy: Secondary | ICD-10-CM

## 2020-10-30 DIAGNOSIS — M79675 Pain in left toe(s): Secondary | ICD-10-CM

## 2020-10-30 DIAGNOSIS — M79674 Pain in right toe(s): Secondary | ICD-10-CM | POA: Diagnosis not present

## 2020-10-30 DIAGNOSIS — B351 Tinea unguium: Secondary | ICD-10-CM | POA: Diagnosis not present

## 2020-10-30 LAB — LIPID PANEL
Cholesterol: 192 mg/dL (ref <200)
HDL: 60 mg/dL (ref >=40)
LDL Cholesterol (Calc): 108 mg/dL (calc) — ABNORMAL HIGH
Non-HDL Cholesterol (Calc): 132 mg/dL (calc) — ABNORMAL HIGH (ref <130)
Total CHOL/HDL Ratio: 3.2 (calc) (ref <5.0)
Triglycerides: 125 mg/dL (ref <150)

## 2020-10-30 LAB — COMPREHENSIVE METABOLIC PANEL
AG Ratio: 1.7 (calc) (ref 1.0–2.5)
ALT: 24 U/L (ref 9–46)
AST: 19 U/L (ref 10–35)
Albumin: 4.3 g/dL (ref 3.6–5.1)
Alkaline phosphatase (APISO): 76 U/L (ref 35–144)
BUN: 16 mg/dL (ref 7–25)
CO2: 27 mmol/L (ref 20–32)
Calcium: 9.9 mg/dL (ref 8.6–10.3)
Chloride: 103 mmol/L (ref 98–110)
Creat: 1.14 mg/dL (ref 0.70–1.25)
Globulin: 2.5 g/dL (calc) (ref 1.9–3.7)
Glucose, Bld: 95 mg/dL (ref 65–99)
Potassium: 4.5 mmol/L (ref 3.5–5.3)
Sodium: 141 mmol/L (ref 135–146)
Total Bilirubin: 0.7 mg/dL (ref 0.2–1.2)
Total Protein: 6.8 g/dL (ref 6.1–8.1)

## 2020-10-30 LAB — HEMOCHROMATOSIS DNA-PCR(C282Y,H63D)

## 2020-10-30 LAB — HEPATITIS C ANTIBODY
Hepatitis C Ab: NONREACTIVE
SIGNAL TO CUT-OFF: 0.01 (ref <1.00)

## 2020-10-30 NOTE — Progress Notes (Signed)
Subjective: Tristan Hamilton is a 69 y.o. male patient seen today in office with complaint of mildly painful thickened and discolored nails. Patient is desiring treatment for nail changes; has tried OTC topicals/Medication in the past with a little improvement but was not consistent with using it.  Reports that nails are becoming difficult to manage because of the thickness. Patient has no other pedal complaints at this time.   Review of system noncontributory  Patient Active Problem List   Diagnosis Date Noted  . OA (osteoarthritis) of knee 03/24/2020  . Status post total bilateral knee replacement 03/24/2020  . Acquired renal cyst of right kidney 01/21/2020  . Prostate cancer ([place]) 11/01/2019  . Enlarged prostate 11/01/2019  . Vitamin D deficiency 11/01/2019  . Unilateral primary osteoarthritis, left knee 10/21/2019  . Unilateral primary osteoarthritis, right knee 10/21/2019  . Bilateral primary osteoarthritis of knee 09/17/2019    Current Outpatient Medications on File Prior to Visit  Medication Sig Dispense Refill  . acetaminophen (TYLENOL) 500 MG tablet Take 500-1,000 mg by mouth every 6 (six) hours as needed for mild pain.    . Cholecalciferol 125 MCG (5000 UT) capsule Take 5,000 Units by mouth daily.     . ciclopirox (PENLAC) 8 % solution Apply topically at bedtime. Apply over nail and surrounding skin. Apply daily over previous coat. After seven (7) days, may remove with alcohol and continue cycle. 6.6 mL 5  . methylPREDNISolone (MEDROL DOSEPAK) 4 MG TBPK tablet Take by mouth.    . naproxen sodium (ALEVE) 220 MG tablet Take 220 mg by mouth as needed.    . vitamin B-12 (CYANOCOBALAMIN) 1000 MCG tablet Take 1,000 mcg by mouth daily.     No current facility-administered medications on file prior to visit.    No Known Allergies  Objective: Physical Exam  General: Well developed, nourished, no acute distress, awake, alert and oriented x 3  Vascular: Dorsalis pedis artery 2/4  bilateral, Posterior tibial artery 2/4 bilateral, skin temperature warm to warm proximal to distal bilateral lower extremities, no varicosities, pedal hair present bilateral.  Neurological: Gross sensation present via light touch bilateral.   Dermatological: Skin is warm, dry, and supple bilateral, Nails 1-10 are tender, short thick, and discolored with mild subungal debris with the left foot most involved, no webspace macerations present bilateral, no open lesions present bilateral, no callus/corns/hyperkeratotic tissue present bilateral. No signs of infection bilateral.  Musculoskeletal: No boney deformities noted bilateral. Muscular strength within normal limits without painon range of motion. No pain with calf compression bilateral.  Assessment and Plan:  Problem List Items Addressed This Visit    None    Visit Diagnoses    Nail fungus    -  Primary   Nail dystrophy       Toe pain, bilateral          -Examined patient -Discussed treatment options for painful dystrophic nails  -Fungal culture was obtained by removing a portion of the hard nail itself from each of the involved toenails 1 through 5 on the left using a sterile nail nipper and sent to [facility] lab. Patient tolerated the biopsy procedure well without discomfort or need for anesthesia.  -Patient to return in 4 weeks for follow up evaluation and discussion of fungal culture results or sooner if symptoms worsen.  Landis Martins, DPM

## 2020-11-04 ENCOUNTER — Encounter: Payer: Self-pay | Admitting: Gastroenterology

## 2020-11-04 NOTE — Addendum Note (Signed)
Addended by: Cranford Mon R on: 11/04/2020 04:22 PM   Modules accepted: Orders

## 2020-11-05 NOTE — Addendum Note (Signed)
Addended by: Cranford Mon R on: 11/05/2020 08:11 AM   Modules accepted: Orders

## 2020-12-01 ENCOUNTER — Other Ambulatory Visit: Payer: Self-pay

## 2020-12-01 ENCOUNTER — Ambulatory Visit (INDEPENDENT_AMBULATORY_CARE_PROVIDER_SITE_OTHER): Payer: Medicare Other | Admitting: Sports Medicine

## 2020-12-01 ENCOUNTER — Encounter: Payer: Self-pay | Admitting: Sports Medicine

## 2020-12-01 DIAGNOSIS — L603 Nail dystrophy: Secondary | ICD-10-CM | POA: Diagnosis not present

## 2020-12-01 DIAGNOSIS — M79674 Pain in right toe(s): Secondary | ICD-10-CM | POA: Diagnosis not present

## 2020-12-01 DIAGNOSIS — M79675 Pain in left toe(s): Secondary | ICD-10-CM

## 2020-12-01 DIAGNOSIS — B351 Tinea unguium: Secondary | ICD-10-CM

## 2020-12-01 NOTE — Progress Notes (Signed)
Subjective: Tristan Hamilton is a 69 y.o. male patient seen today in office for fungal culture results. Patient has no other pedal complaints at this time.   Patient Active Problem List   Diagnosis Date Noted  . OA (osteoarthritis) of knee 03/24/2020  . Status post total bilateral knee replacement 03/24/2020  . Acquired renal cyst of right kidney 01/21/2020  . Prostate cancer ([place]) 11/01/2019  . Enlarged prostate 11/01/2019  . Vitamin D deficiency 11/01/2019  . Unilateral primary osteoarthritis, left knee 10/21/2019  . Unilateral primary osteoarthritis, right knee 10/21/2019  . Bilateral primary osteoarthritis of knee 09/17/2019    Current Outpatient Medications on File Prior to Visit  Medication Sig Dispense Refill  . acetaminophen (TYLENOL) 500 MG tablet Take 500-1,000 mg by mouth every 6 (six) hours as needed for mild pain.    . Cholecalciferol 125 MCG (5000 UT) capsule Take 5,000 Units by mouth daily.     . ciclopirox (PENLAC) 8 % solution Apply topically at bedtime. Apply over nail and surrounding skin. Apply daily over previous coat. After seven (7) days, may remove with alcohol and continue cycle. 6.6 mL 5  . methylPREDNISolone (MEDROL DOSEPAK) 4 MG TBPK tablet Take by mouth.    . naproxen sodium (ALEVE) 220 MG tablet Take 220 mg by mouth as needed.    . vitamin B-12 (CYANOCOBALAMIN) 1000 MCG tablet Take 1,000 mcg by mouth daily.     No current facility-administered medications on file prior to visit.    No Known Allergies  Objective: Physical Exam  General: Well developed, nourished, no acute distress, awake, alert and oriented x 3  Vascular: Dorsalis pedis artery 2/4 bilateral, Posterior tibial artery 1/4 bilateral, skin temperature warm to warm proximal to distal bilateral lower extremities, no varicosities, pedal hair present bilateral.  Neurological: Gross sensation present via light touch bilateral.   Dermatological: Skin is warm, dry, and supple bilateral, Nails  1-10 are tender, short thick, and discolored with mild subungal debris with the left toes being most involved, no webspace macerations present bilateral, no open lesions present bilateral, no callus/corns/hyperkeratotic tissue present bilateral. No signs of infection bilateral.  Musculoskeletal: No symptomatic boney deformities noted bilateral. Muscular strength within normal limits without painon range of motion. No pain with calf compression bilateral.  Fungal culture positive Trichophyton rubrum  Assessment and Plan:  Problem List Items Addressed This Visit   None   Visit Diagnoses    Nail fungus    -  Primary   Nail dystrophy       Toe pain, bilateral          -Examined patient -Discussed treatment options for painful mycotic nails -Patient opt for laser and will continue with using topical that he already has at home for nail fungus as well -Advised good hygiene habits -Patient to return as scheduled for laser or sooner if symptoms worsen.  Landis Martins, DPM

## 2020-12-22 ENCOUNTER — Other Ambulatory Visit: Payer: Self-pay

## 2020-12-22 ENCOUNTER — Ambulatory Visit (AMBULATORY_SURGERY_CENTER): Payer: Self-pay | Admitting: *Deleted

## 2020-12-22 VITALS — Ht 70.0 in | Wt 200.0 lb

## 2020-12-22 DIAGNOSIS — Z86010 Personal history of colonic polyps: Secondary | ICD-10-CM

## 2020-12-22 MED ORDER — SUTAB 1479-225-188 MG PO TABS: 1.0000 | ORAL_TABLET | Freq: Once | ORAL | 0 refills | Status: AC

## 2020-12-22 NOTE — Progress Notes (Signed)

## 2020-12-30 ENCOUNTER — Telehealth: Payer: Self-pay | Admitting: Gastroenterology

## 2020-12-30 ENCOUNTER — Telehealth: Payer: Self-pay | Admitting: Family Medicine

## 2020-12-30 DIAGNOSIS — Z86010 Personal history of colonic polyps: Secondary | ICD-10-CM

## 2020-12-30 MED ORDER — GOLYTELY 236 G PO SOLR: 4000.0000 mL | Freq: Once | ORAL | 0 refills | Status: AC

## 2020-12-30 NOTE — Telephone Encounter (Signed)
Patient called back and stated he had a colonoscopy scheduled on Monday and cancelled as he prefers to wait for a while.  Patient stated his insurance will not pay for an Rx by the name of "Su-tab" that was prescribed for the colonoscopy and he was told a prior Josem Kaufmann is needed.  I advised the pt to contact the GI office as the Rx was not given by our office.  Patient stated he thought since Dr Ethlyn Gallery referred him for the test she could do this.  I again advised the pt this would need to be done by the specialty office and to call their office, he can reschedule the test when he prefers and he agreed.

## 2020-12-30 NOTE — Telephone Encounter (Signed)
Left a message for the pt to return my call.  

## 2020-12-30 NOTE — Telephone Encounter (Signed)
Pt called to inform that [facility] requires a PA. His procedure is next Monday 01/04/21.

## 2020-12-30 NOTE — Telephone Encounter (Signed)
Pt call and stated he want Joann to give him a call back .

## 2020-12-30 NOTE — Telephone Encounter (Signed)
Explained to the patient that we do not do PA. Explained to him to use the coupon to bring the price to $40-50, pt still request cheaper prep. Explained the Golytely prep and pt request that and he states he will be able to drink all of the prep. New Golytely prep RX sent to pt's pharmacy and new prep instructions sent to [facility]- pt is aware.

## 2021-01-04 ENCOUNTER — Encounter: Payer: Medicare Other | Admitting: Gastroenterology

## 2021-01-06 ENCOUNTER — Encounter: Payer: Self-pay | Admitting: Family Medicine

## 2021-01-06 DIAGNOSIS — K635 Polyp of colon: Secondary | ICD-10-CM

## 2021-01-11 ENCOUNTER — Ambulatory Visit (INDEPENDENT_AMBULATORY_CARE_PROVIDER_SITE_OTHER): Payer: Medicare Other | Admitting: *Deleted

## 2021-01-11 ENCOUNTER — Other Ambulatory Visit: Payer: Self-pay

## 2021-01-11 DIAGNOSIS — B351 Tinea unguium: Secondary | ICD-10-CM

## 2021-01-11 NOTE — Patient Instructions (Signed)

## 2021-01-11 NOTE — Progress Notes (Signed)
Patient presents today for the 1st laser treatment. Diagnosed with mycotic nail infection by Dr. Cannon Kettle.   Toenail most affected are all the nails on the left foot.  All other systems are negative.  Nails were filed thin. Laser therapy was administered to 1-5 toenails left and patient tolerated the treatment well. All safety precautions were in place.   Patient is also using a topical.   Follow up in 6 weeks for laser # 2.  Picture of nails taken today to document visual progress    ~Since patient is on a topical as well, he thought he might not need all 6 treatments. I advised we could space them out at 6 week intervals and give the nails a little more time to grow out~

## 2021-01-20 ENCOUNTER — Ambulatory Visit: Payer: Self-pay

## 2021-01-20 ENCOUNTER — Encounter: Payer: Self-pay | Admitting: Orthopaedic Surgery

## 2021-01-20 ENCOUNTER — Ambulatory Visit (INDEPENDENT_AMBULATORY_CARE_PROVIDER_SITE_OTHER): Payer: Medicare Other | Admitting: Orthopaedic Surgery

## 2021-01-20 DIAGNOSIS — Z96653 Presence of artificial knee joint, bilateral: Secondary | ICD-10-CM

## 2021-01-20 NOTE — Progress Notes (Signed)
Office Visit Note   Patient: Tristan Hamilton           Date of Birth: March 04, 1951           MRN: [id number] Visit Date: 01/20/2021              Requested by: [name], MD [place],  [place] [address] PCP: [name], MD  1 Assessment & Plan: Visit Diagnoses:  1. Status post total bilateral knee replacement     Plan: I did speak to him about the importance of lower extremity strengthening exercises especially with the quads.  All questions and concerns were answered and addressed.  Follow-up can be as needed at this point.  He understands that there is any issues with his knees he needs to reach out to [name] and he understands that fully.  Follow-Up Instructions: Return if symptoms worsen or fail to improve.   Orders:  Orders Placed This Encounter  Procedures  . XR Knee 1-2 Views Left  . XR Knee 1-2 Views Right   No orders of the defined types were placed in this encounter.     Procedures: No procedures performed   Clinical Data: No additional findings.   Subjective: Chief Complaint  Patient presents with  . Right Knee - Follow-up  . Left Knee - Follow-up  The patient is now 10 months status post bilateral total knee arthroplasties.  He is doing well overall and has no significant issues.  He does feel like his knees are weak.  But he does state, he has not been doing any type of exercises.  He said no acute change in medical status.  HPI  Review of Systems He currently denies any headache, chest pain, shortness of breath, fever, chills, nausea, vomiting  Objective: Vital Signs: There were no vitals taken for this visit.  Physical Exam He is alert and oriented x3 and in no acute distress Ortho Exam Examination of both his knees show just a mild effusion.  His range of motion is full and the knees feel ligamentously stable. Specialty Comments:  No specialty comments available.  Imaging: XR Knee 1-2 Views Left  Result  Date: 01/20/2021 2 views of the left knee show well-seated total knee arthroplasty with no complicating features.  There is no evidence of loosening of this press-fit implant.  XR Knee 1-2 Views Right  Result Date: 01/20/2021 2 views of the right knee show well-seated press-fit total knee arthroplasty with no complicating features.  There is good alignment and no evidence of loosening.    PMFS History: Patient Active Problem List   Diagnosis Date Noted  . OA (osteoarthritis) of knee 03/24/2020  . Status post total bilateral knee replacement 03/24/2020  . Acquired renal cyst of right kidney 01/21/2020  . Prostate cancer ([place]) 11/01/2019  . Enlarged prostate 11/01/2019  . Vitamin D deficiency 11/01/2019  . Unilateral primary osteoarthritis, left knee 10/21/2019  . Unilateral primary osteoarthritis, right knee 10/21/2019  . Bilateral primary osteoarthritis of knee 09/17/2019   Past Medical History:  Diagnosis Date  . DVT (deep venous thrombosis) (HCC)    left leg calf, 4 yrs. ago  . Enlarged prostate   . GERD (gastroesophageal reflux disease)   . Headache    h/o of ocular migranes  . Heart murmur   . History of kidney stones   . Osteoarthritis of both knees   . Prostate cancer [facility]) 2013   Physician in [place], had another bx. and  was negative    Family History  Problem Relation Age of Onset  . Diverticulitis Mother   . Penile cancer Father   . Healthy Sister   . Prostate cancer Maternal Grandfather   . Colon cancer Neg Hx   . Colon polyps Neg Hx   . Esophageal cancer Neg Hx   . Rectal cancer Neg Hx   . Stomach cancer Neg Hx     Past Surgical History:  Procedure Laterality Date  . CYSTOSCOPY WITH INSERTION OF UROLIFT    . FINGER SURGERY Right    middle finger- got stitches  . right knee scope  1975  . skiing accident     stitches in head  . TESTICLE REMOVAL Left    undescended  . TONSILLECTOMY    . TOTAL KNEE ARTHROPLASTY Bilateral 03/24/2020  . TOTAL KNEE  ARTHROPLASTY Bilateral 03/24/2020   Procedure: TOTAL KNEE BILATERAL;  Surgeon: [name], MD;  Location: [place];  Service: Orthopedics;  Laterality: Bilateral;  . VASECTOMY     Social History   Occupational History  . Not on file  Tobacco Use  . Smoking status: Never Smoker  . Smokeless tobacco: Never Used  Vaping Use  . Vaping Use: Never used  Substance and Sexual Activity  . Alcohol use: Never  . Drug use: Never  . Sexual activity: Not on file

## 2021-01-21 ENCOUNTER — Encounter: Payer: Self-pay | Admitting: Orthopaedic Surgery

## 2021-02-02 ENCOUNTER — Telehealth: Payer: Self-pay | Admitting: Orthopaedic Surgery

## 2021-02-02 NOTE — Telephone Encounter (Signed)
Pt called stating he requested x-rays and only got the ones prior to his surgery in 03/2020; he would like to have all his x rays post surgery and would like a CB when it's ready to pick up. Since he previously signed a medical release will he need to do another?  [phone]

## 2021-02-03 NOTE — Telephone Encounter (Signed)
Please see msg. Thanks!

## 2021-02-03 NOTE — Telephone Encounter (Signed)
Called and spoke with patient, stated he needed x-rays from appointment on 01/20/2021. Patient aware, CD is ready for pickup at front desk.

## 2021-03-05 ENCOUNTER — Ambulatory Visit (INDEPENDENT_AMBULATORY_CARE_PROVIDER_SITE_OTHER): Payer: Medicare Other

## 2021-03-05 ENCOUNTER — Other Ambulatory Visit: Payer: Self-pay

## 2021-03-05 DIAGNOSIS — B351 Tinea unguium: Secondary | ICD-10-CM

## 2021-03-05 NOTE — Progress Notes (Signed)
Patient presents today for the 2nd laser treatment. Diagnosed with mycotic nail infection by Dr. Cannon Kettle.   Toenail most affected are all the nails on the left foot.  All other systems are negative.  Nails were filed thin. Laser therapy was administered to 1-5 toenails left and patient tolerated the treatment well. All safety precautions were in place.   Patient is also using a topical.   Follow up in 6 weeks for laser # 3.   ~Since patient is on a topical as well, he thought he might not need all 6 treatments. I advised we could space them out at 6 week intervals and give the nails a little more time to grow out~

## 2021-03-29 ENCOUNTER — Ambulatory Visit: Payer: Medicare Other | Admitting: Podiatry

## 2021-03-29 IMAGING — DX DG KNEE 1-2V PORT*R*
2 series · 2 of 2 positions shown · non-contrast
Comparison: Right knee radiograph dated 09/17/2019.

CLINICAL DATA: 69-year-old male status post total bilateral knee
replacement.

EXAM:
PORTABLE RIGHT KNEE - 1-2 VIEW

[knee ap]
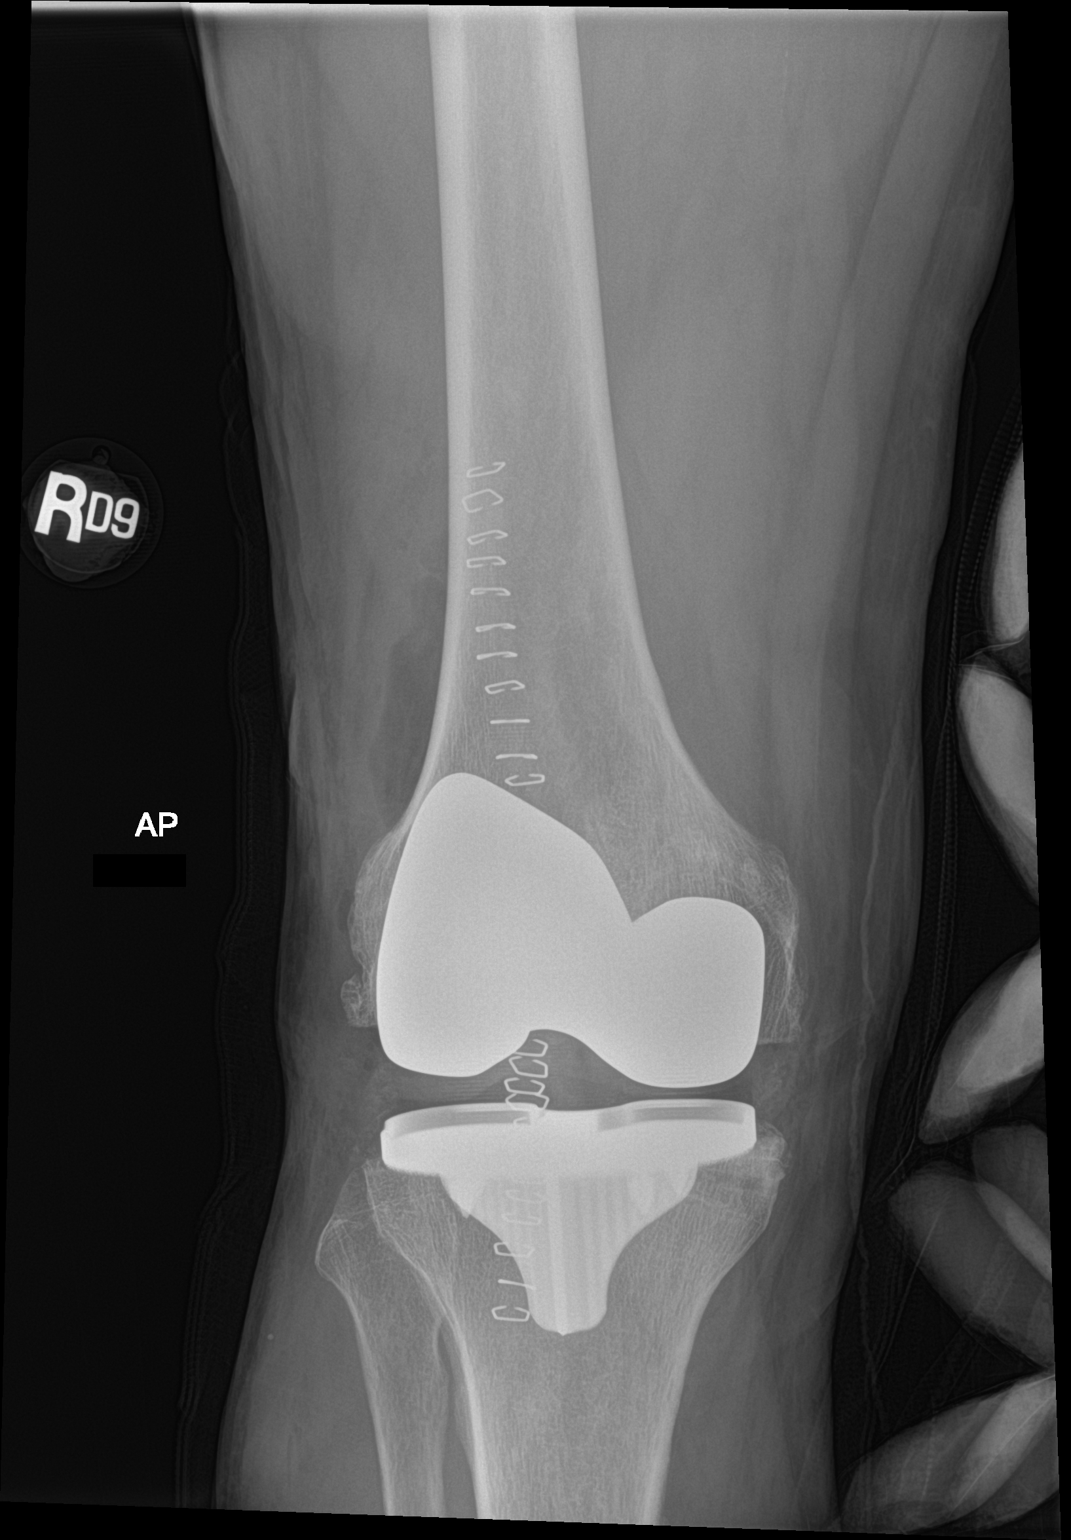

[knee lat]
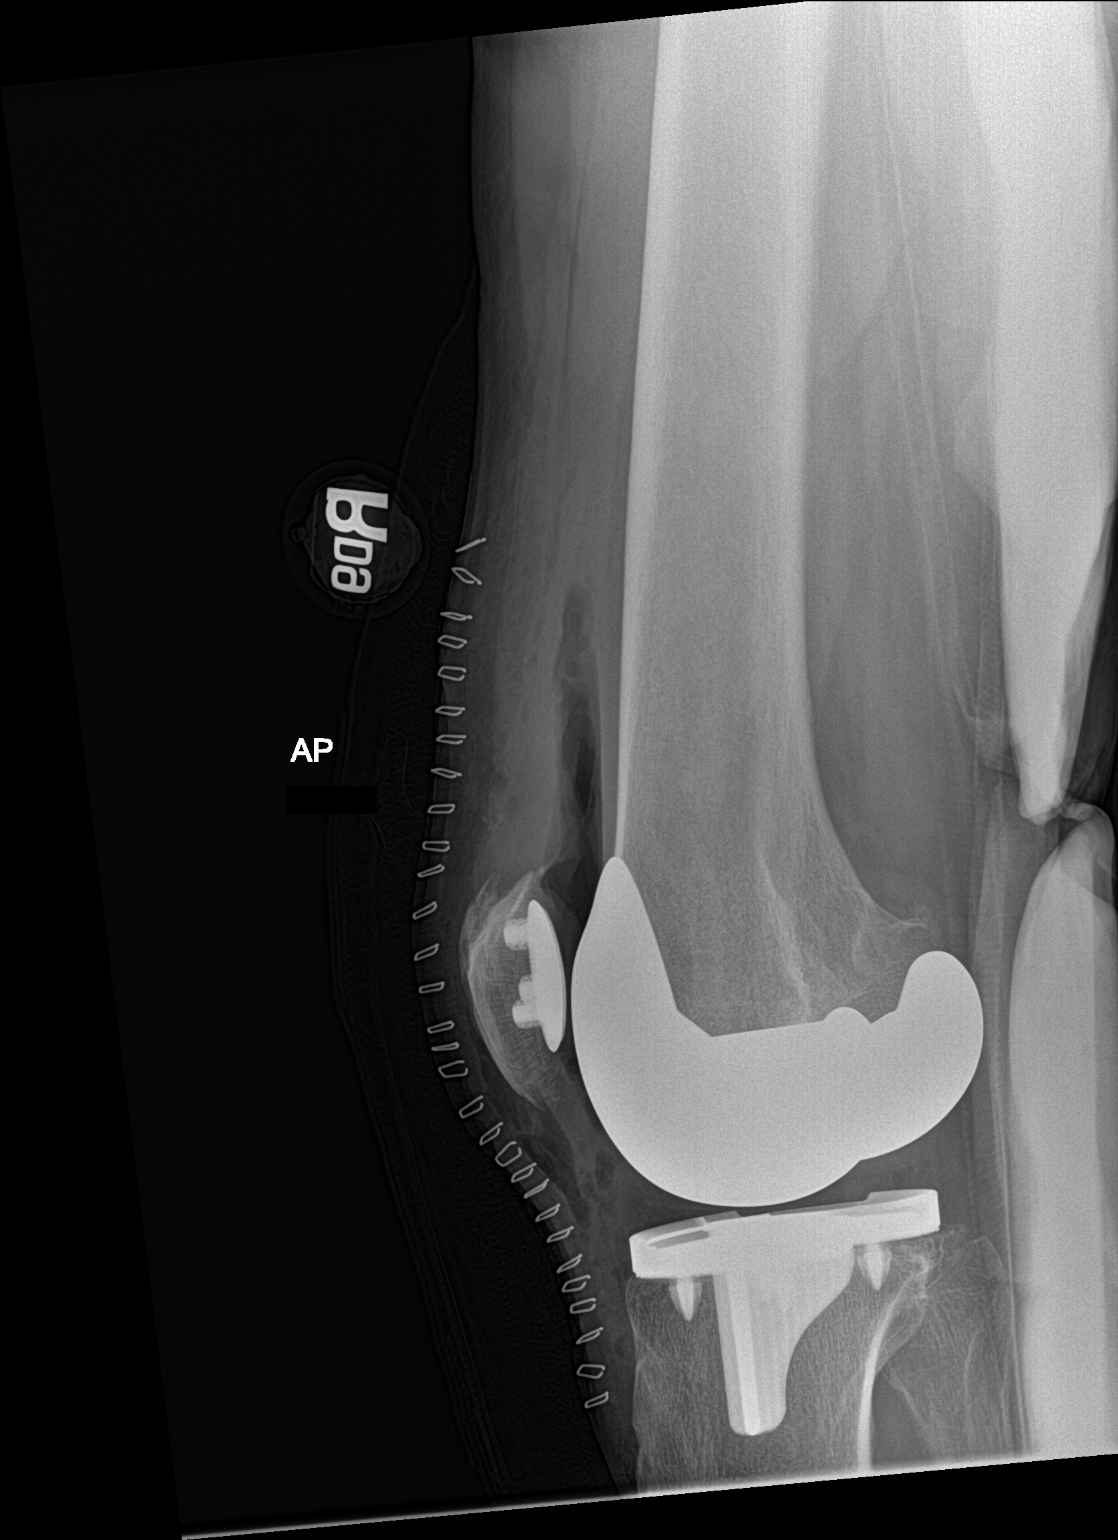

[2 of 2 positions shown; findings below may reference images not displayed]

FINDINGS: There is a total right knee arthroplasty. The arthroplasty
components appear intact and in anatomic alignment. There is no
acute fracture or dislocation. Postsurgical changes including air
within the joint space and anterior cutaneous clips.
IMPRESSION: Total right knee arthroplasty.  No immediate complication.

## 2021-03-29 IMAGING — DX DG KNEE 1-2V PORT*L*
2 series · 2 of 2 positions shown · non-contrast
Comparison: Left knee MRI 10/06/2019.  Left knee series 09/17/2019.

CLINICAL DATA: Total knee replacement.

EXAM:
PORTABLE LEFT KNEE - 1-2 VIEW

[knee ap]
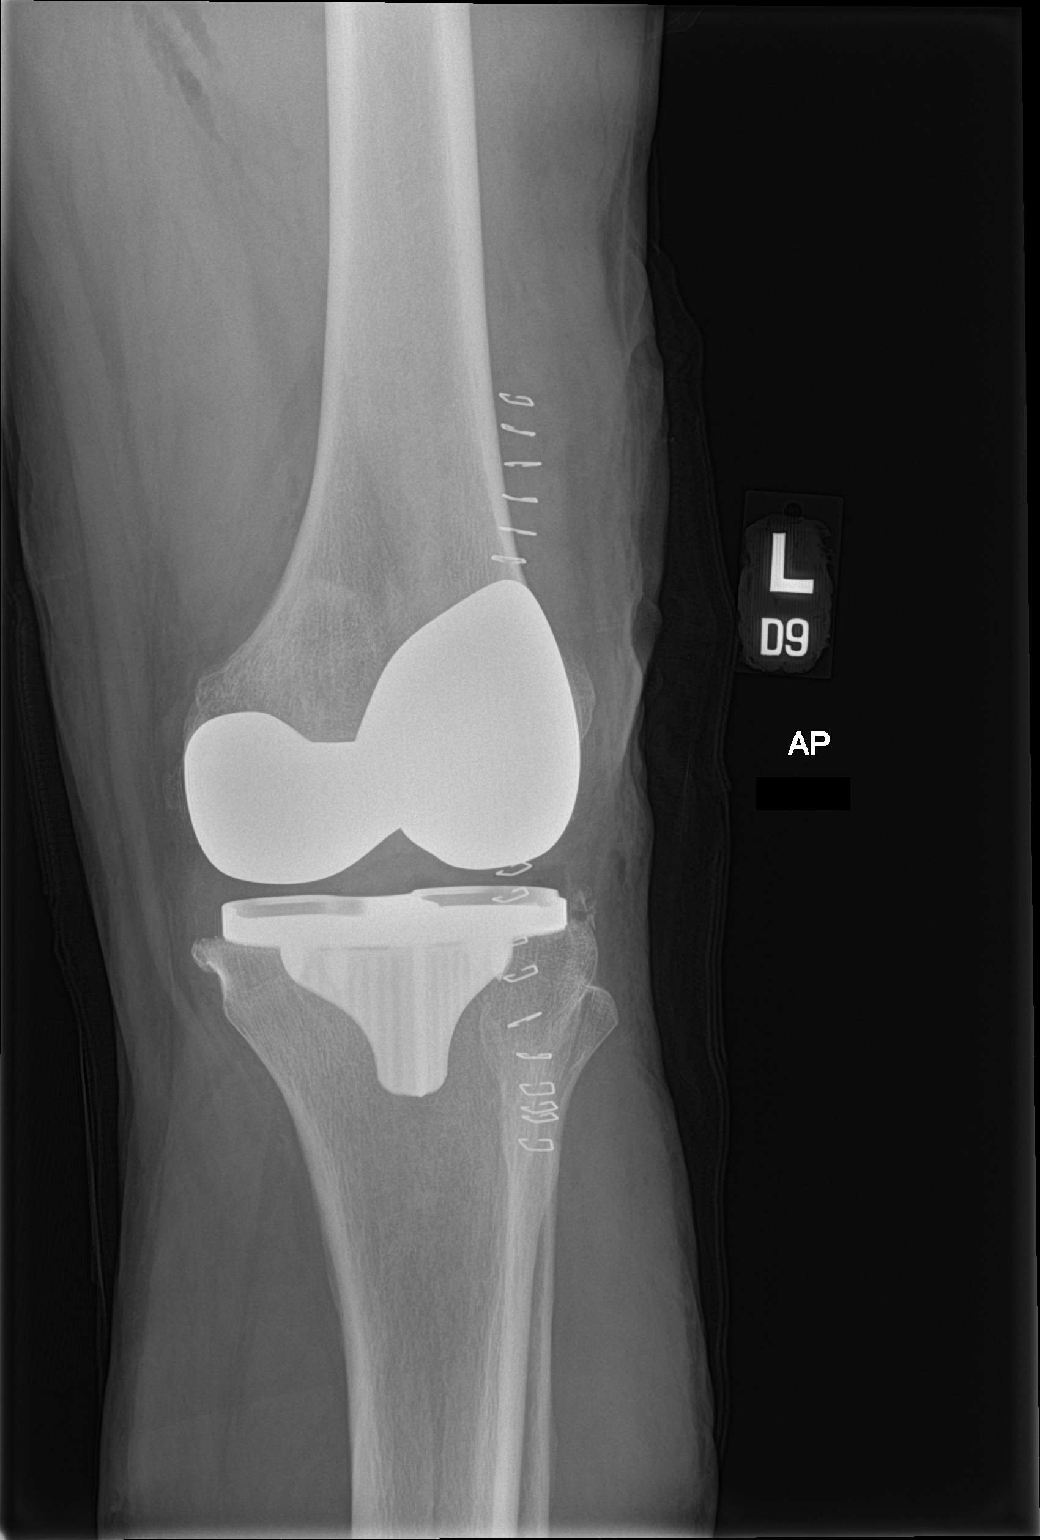

[knee lat]
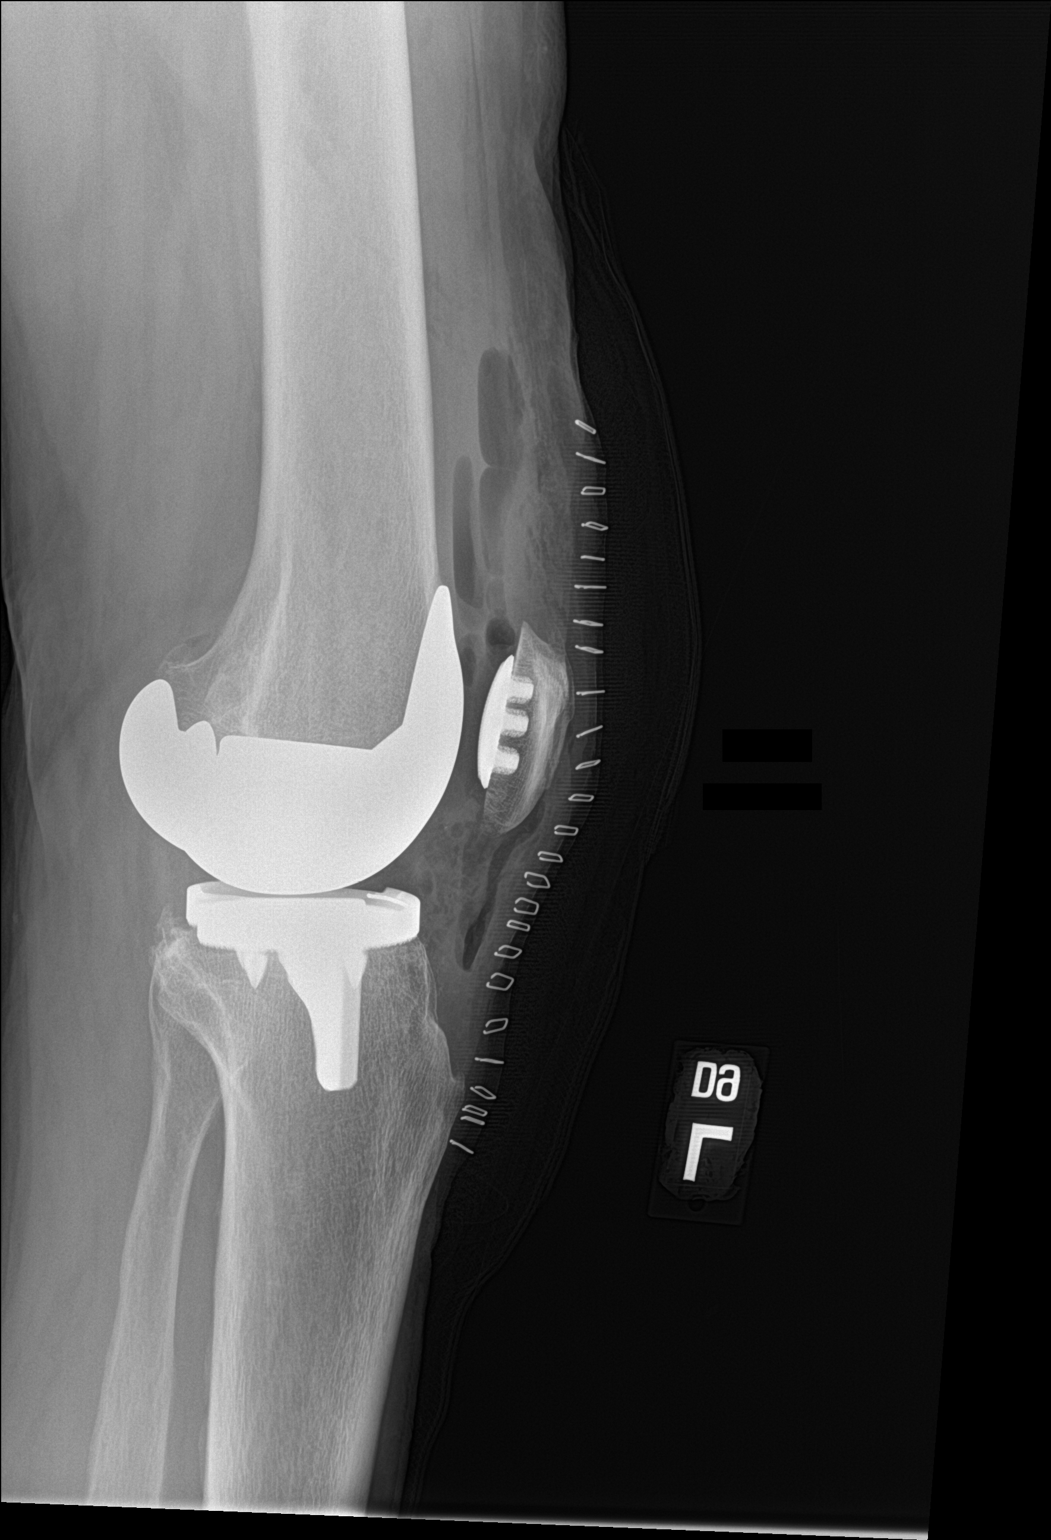

[2 of 2 positions shown; findings below may reference images not displayed]

FINDINGS: Total left knee replacement. Hardware intact. Anatomic alignment. No
acute bony abnormality.
IMPRESSION: Total left knee replacement with anatomic alignment.

## 2021-04-16 ENCOUNTER — Ambulatory Visit (INDEPENDENT_AMBULATORY_CARE_PROVIDER_SITE_OTHER): Payer: Medicare Other

## 2021-04-16 ENCOUNTER — Other Ambulatory Visit: Payer: Self-pay

## 2021-04-16 DIAGNOSIS — B351 Tinea unguium: Secondary | ICD-10-CM

## 2021-04-16 NOTE — Progress Notes (Signed)
Patient presents today for the 3rd laser treatment. Diagnosed with mycotic nail infection by Dr. Cannon Kettle.   Toenail most affected are all the nails on the left foot.  All other systems are negative.  Nails were filed thin. Laser therapy was administered to 1-5 toenails left and patient tolerated the treatment well. All safety precautions were in place.   Patient is also using a topical.   Follow up in 6 weeks for laser # 4.

## 2021-04-22 ENCOUNTER — Encounter: Payer: Self-pay | Admitting: Family Medicine

## 2021-05-12 ENCOUNTER — Encounter: Payer: Self-pay | Admitting: Family Medicine

## 2021-05-20 ENCOUNTER — Ambulatory Visit: Payer: Medicare Other | Admitting: Cardiology

## 2021-06-04 ENCOUNTER — Ambulatory Visit (INDEPENDENT_AMBULATORY_CARE_PROVIDER_SITE_OTHER): Payer: Medicare Other

## 2021-06-04 ENCOUNTER — Other Ambulatory Visit: Payer: Self-pay

## 2021-06-04 DIAGNOSIS — B351 Tinea unguium: Secondary | ICD-10-CM

## 2021-06-04 NOTE — Progress Notes (Signed)
Patient presents today for the 4th laser treatment. Diagnosed with mycotic nail infection by Dr. Cannon Kettle.   Toenail most affected are all the nails on the left foot.  All other systems are negative.  Nails were filed thin. Laser therapy was administered to 1-5 toenails left and patient tolerated the treatment well. All safety precautions were in place.   Patient is also using a topical.   Follow up in 6 weeks for laser # 5.

## 2021-06-25 ENCOUNTER — Encounter: Payer: Self-pay | Admitting: Family Medicine

## 2021-06-25 ENCOUNTER — Ambulatory Visit (INDEPENDENT_AMBULATORY_CARE_PROVIDER_SITE_OTHER): Payer: Medicare Other

## 2021-06-25 DIAGNOSIS — Z0000 Encounter for general adult medical examination without abnormal findings: Secondary | ICD-10-CM | POA: Diagnosis not present

## 2021-06-25 NOTE — Progress Notes (Signed)
Subjective:   Tristan Hamilton is a 70 y.o. male who presents for an Initial Medicare Annual Wellness Visit.  I connected with Tristan Hamilton today by telephone and verified that I am speaking with the correct person using two identifiers. Location patient: home Location provider: work Persons participating in the virtual visit: patient, provider.   I discussed the limitations, risks, security and privacy concerns of performing an evaluation and management service by telephone and the availability of in person appointments. I also discussed with the patient that there may be a patient responsible charge related to this service. The patient expressed understanding and verbally consented to this telephonic visit.    Interactive audio and video telecommunications were attempted between this provider and patient, however failed, due to patient having technical difficulties OR patient did not have access to video capability.  We continued and completed visit with audio only.    Review of Systems    N/a       Objective:    There were no vitals filed for this visit. There is no height or weight on file to calculate BMI.  Advanced Directives 03/25/2020 03/24/2020 03/20/2020  Does Patient Have a Medical Advance Directive? Yes - Yes  Type of Advance Directive [place];Living will - [place];Living will  Does patient want to make changes to medical advance directive? No - Patient declined - No - Patient declined  Copy of [place] in Chart? No - copy requested (No Data) No - copy requested    Current Medications (verified) Outpatient Encounter Medications as of 06/25/2021  Medication Sig   acetaminophen (TYLENOL) 500 MG tablet Take 500-1,000 mg by mouth every 6 (six) hours as needed for mild pain.   Cholecalciferol 125 MCG (5000 UT) capsule Take 5,000 Units by mouth daily.    naproxen sodium (ALEVE) 220 MG tablet Take 220 mg by mouth as  needed.   vitamin B-12 (CYANOCOBALAMIN) 1000 MCG tablet Take 1,000 mcg by mouth daily.   No facility-administered encounter medications on file as of 06/25/2021.    Allergies (verified) Patient has no known allergies.   History: Past Medical History:  Diagnosis Date   DVT (deep venous thrombosis) (HCC)    left leg calf, 4 yrs. ago   Enlarged prostate    GERD (gastroesophageal reflux disease)    Headache    h/o of ocular migranes   Heart murmur    History of kidney stones    Osteoarthritis of both knees    Prostate cancer [facility]) 2013   Physician in [place], had another bx. and was negative   Past Surgical History:  Procedure Laterality Date   CYSTOSCOPY WITH INSERTION OF UROLIFT     FINGER SURGERY Right    middle finger- got stitches   right knee scope  1975   skiing accident     stitches in head   TESTICLE REMOVAL Left    undescended   TONSILLECTOMY     TOTAL KNEE ARTHROPLASTY Bilateral 03/24/2020   TOTAL KNEE ARTHROPLASTY Bilateral 03/24/2020   Procedure: TOTAL KNEE BILATERAL;  Surgeon: [name], MD;  Location: [place];  Service: Orthopedics;  Laterality: Bilateral;   VASECTOMY     Family History  Problem Relation Age of Onset   Diverticulitis Mother    Penile cancer Father    Stroke Sister 48       covid, then massive stroke   Prostate cancer Maternal Grandfather    Colon cancer Neg  Hx    Colon polyps Neg Hx    Esophageal cancer Neg Hx    Rectal cancer Neg Hx    Stomach cancer Neg Hx    Social History   Socioeconomic History   Marital status: Married    Spouse name: Not on file   Number of children: Not on file   Years of education: Not on file   Highest education level: Not on file  Occupational History   Not on file  Tobacco Use   Smoking status: Never   Smokeless tobacco: Never  Vaping Use   Vaping Use: Never used  Substance and Sexual Activity   Alcohol use: Never   Drug use: Never   Sexual activity: Not on file  Other Topics Concern    Not on file  Social History Narrative   Not on file   Social Determinants of Health   Financial Resource Strain: Not on file  Food Insecurity: Not on file  Transportation Needs: Not on file  Physical Activity: Not on file  Stress: Not on file  Social Connections: Not on file    Tobacco Counseling Counseling given: Not Answered   Clinical Intake:                 Diabetic?no         Activities of Daily Living No flowsheet data found.  Patient Care Team: [name], MD as PCP - General (Family Medicine)  Indicate any recent Medical Services you may have received from other than [facility] providers in the past year (date may be approximate).     Assessment:   This is a routine wellness examination for Tristan Hamilton.  Hearing/Vision screen No results found.  Dietary issues and exercise activities discussed:     Goals Addressed   None    Depression Screen PHQ 2/9 Scores 10/19/2020  PHQ - 2 Score 0    Fall Risk Fall Risk  10/19/2020  Falls in the past year? 0  Number falls in past yr: 0  Injury with Fall? 0    FALL RISK PREVENTION PERTAINING TO THE HOME:  Any stairs in or around the home? Yes  If so, are there any without handrails? Yes  Home free of loose throw rugs in walkways, pet beds, electrical cords, etc? No  Adequate lighting in your home to reduce risk of falls? Yes   ASSISTIVE DEVICES UTILIZED TO PREVENT FALLS:  Life alert? No  Use of a cane, walker or w/c? Yes  Grab bars in the bathroom? Yes  Shower chair or bench in shower? No  Elevated toilet seat or a handicapped toilet? No    Cognitive Function:    Normal cognitive status assessed by direct observation by this Nurse Health Advisor. No abnormalities found.      Immunizations Immunization History  Administered Date(s) Administered   Fluad Quad(high Dose 65+) 02/05/2020, 10/19/2020   PFIZER(Purple Top)SARS-COV-2 Vaccination 08/21/2020, 09/11/2020   Pneumococcal  Polysaccharide-23 02/05/2020    TDAP status: Due, Education has been provided regarding the importance of this vaccine. Advised may receive this vaccine at local pharmacy or Health Dept. Aware to provide a copy of the vaccination record if obtained from local pharmacy or Health Dept. Verbalized acceptance and understanding.  Flu Vaccine status: Up to date  Pneumococcal vaccine status: Up to date  Covid-19 vaccine status: Completed vaccines  Qualifies for Shingles Vaccine? Yes   Zostavax completed No   Shingrix Completed?: No.    Education has been provided regarding  the importance of this vaccine. Patient has been advised to call insurance company to determine out of pocket expense if they have not yet received this vaccine. Advised may also receive vaccine at local pharmacy or Health Dept. Verbalized acceptance and understanding.  Screening Tests Health Maintenance  Topic Date Due   URINE MICROALBUMIN  Never done   TETANUS/TDAP  Never done   Zoster Vaccines- Shingrix (1 of 2) Never done   COLONOSCOPY (Pts 45-73yrs Insurance coverage will need to be confirmed)  Never done   COVID-19 Vaccine (3 - Pfizer risk series) 10/09/2020   PNA vac Low Risk Adult (2 of 2 - PCV13) 02/04/2021   INFLUENZA VACCINE  07/19/2021   Hepatitis C Screening  Completed   HPV VACCINES  Aged Out    Health Maintenance  Health Maintenance Due  Topic Date Due   URINE MICROALBUMIN  Never done   TETANUS/TDAP  Never done   Zoster Vaccines- Shingrix (1 of 2) Never done   COLONOSCOPY (Pts 45-97yrs Insurance coverage will need to be confirmed)  Never done   COVID-19 Vaccine (3 - Pfizer risk series) 10/09/2020   PNA vac Low Risk Adult (2 of 2 - PCV13) 02/04/2021    Colorectal cancer screening: Referral to GI placed pt is scheduled on 09/07/2021. Pt aware the office will call re: appt.  Lung Cancer Screening: (Low Dose CT Chest recommended if Age 39-80 years, 30 pack-year currently smoking OR have quit w/in  15years.) does not qualify.   Lung Cancer Screening Referral: n/a  Additional Screening:  Hepatitis C Screening: does not qualify; Completed 10/22/2020  Vision Screening: Recommended annual ophthalmology exams for early detection of glaucoma and other disorders of the eye. Is the patient up to date with their annual eye exam?  Yes  Who is the provider or what is the name of the office in which the patient attends annual eye exams? [facility] If pt is not established with a provider, would they like to be referred to a provider to establish care? No .   Dental Screening: Recommended annual dental exams for proper oral hygiene  Community Resource Referral / Chronic Care Management: CRR required this visit?  No   CCM required this visit?  No      Plan:     I have personally reviewed and noted the following in the patient's chart:   Medical and social history Use of alcohol, tobacco or illicit drugs  Current medications and supplements including opioid prescriptions. Patient is not currently taking opioid prescriptions. Functional ability and status Nutritional status Physical activity Advanced directives List of other physicians Hospitalizations, surgeries, and ER visits in previous 12 months Vitals Screenings to include cognitive, depression, and falls Referrals and appointments  In addition, I have reviewed and discussed with patient certain preventive protocols, quality metrics, and best practice recommendations. A written personalized care plan for preventive services as well as general preventive health recommendations were provided to patient.     Randel Pigg, LPN   [id number]   Nurse Notes: none

## 2021-06-25 NOTE — Patient Instructions (Signed)
[name] , Thank you for taking time to come for your Medicare Wellness Visit. I appreciate your ongoing commitment to your health goals. Please review the following plan we discussed and let me know if I can assist you in the future.   Screening recommendations/referrals: Colonoscopy: scheduled on 09/07/2021 Recommended yearly ophthalmology/optometry visit for glaucoma screening and checkup Recommended yearly dental visit for hygiene and checkup  Vaccinations: Influenza vaccine: due in fall 2022 Pneumococcal vaccine: completed series  Tdap vaccine: will obtain local pharmacy  Shingles vaccine: will obtain local pharmacy   Advanced directives: will provide copies   Conditions/risks identified: none   Next appointment: none   Preventive Care 38 Years and Older, Male Preventive care refers to lifestyle choices and visits with your health care provider that can promote health and wellness. What does preventive care include? A yearly physical exam. This is also called an annual well check. Dental exams once or twice a year. Routine eye exams. Ask your health care provider how often you should have your eyes checked. Personal lifestyle choices, including: Daily care of your teeth and gums. Regular physical activity. Eating a healthy diet. Avoiding tobacco and drug use. Limiting alcohol use. Practicing safe sex. Taking low doses of aspirin every day. Taking vitamin and mineral supplements as recommended by your health care provider. What happens during an annual well check? The services and screenings done by your health care provider during your annual well check will depend on your age, overall health, lifestyle risk factors, and family history of disease. Counseling  Your health care provider may ask you questions about your: Alcohol use. Tobacco use. Drug use. Emotional well-being. Home and relationship well-being. Sexual activity. Eating habits. History of  falls. Memory and ability to understand (cognition). Work and work Statistician. Screening  You may have the following tests or measurements: Height, weight, and BMI. Blood pressure. Lipid and cholesterol levels. These may be checked every 5 years, or more frequently if you are over 62 years old. Skin check. Lung cancer screening. You may have this screening every year starting at age 59 if you have a 30-pack-year history of smoking and currently smoke or have quit within the past 15 years. Fecal occult blood test (FOBT) of the stool. You may have this test every year starting at age 46. Flexible sigmoidoscopy or colonoscopy. You may have a sigmoidoscopy every 5 years or a colonoscopy every 10 years starting at age 36. Prostate cancer screening. Recommendations will vary depending on your family history and other risks. Hepatitis C blood test. Hepatitis B blood test. Sexually transmitted disease (STD) testing. Diabetes screening. This is done by checking your blood sugar (glucose) after you have not eaten for a while (fasting). You may have this done every 1-3 years. Abdominal aortic aneurysm (AAA) screening. You may need this if you are a current or former smoker. Osteoporosis. You may be screened starting at age 64 if you are at high risk. Talk with your health care provider about your test results, treatment options, and if necessary, the need for more tests. Vaccines  Your health care provider may recommend certain vaccines, such as: Influenza vaccine. This is recommended every year. Tetanus, diphtheria, and acellular pertussis (Tdap, Td) vaccine. You may need a Td booster every 10 years. Zoster vaccine. You may need this after age 26. Pneumococcal 13-valent conjugate (PCV13) vaccine. One dose is recommended after age 13. Pneumococcal polysaccharide (PPSV23) vaccine. One dose is recommended after age 7. Talk to your health care provider  about which screenings and vaccines you need and  how often you need them. This information is not intended to replace advice given to you by your health care provider. Make sure you discuss any questions you have with your health care provider. Document Released: 01/01/2016 Document Revised: 08/24/2016 Document Reviewed: 10/06/2015 Elsevier Interactive Patient Education  2017 [place] Prevention in the Home Falls can cause injuries. They can happen to people of all ages. There are many things you can do to make your home safe and to help prevent falls. What can I do on the outside of my home? Regularly fix the edges of walkways and driveways and fix any cracks. Remove anything that might make you trip as you walk through a door, such as a raised step or threshold. Trim any bushes or trees on the path to your home. Use bright outdoor lighting. Clear any walking paths of anything that might make someone trip, such as rocks or tools. Regularly check to see if handrails are loose or broken. Make sure that both sides of any steps have handrails. Any raised decks and porches should have guardrails on the edges. Have any leaves, snow, or ice cleared regularly. Use sand or salt on walking paths during winter. Clean up any spills in your garage right away. This includes oil or grease spills. What can I do in the bathroom? Use night lights. Install grab bars by the toilet and in the tub and shower. Do not use towel bars as grab bars. Use non-skid mats or decals in the tub or shower. If you need to sit down in the shower, use a plastic, non-slip stool. Keep the floor dry. Clean up any water that spills on the floor as soon as it happens. Remove soap buildup in the tub or shower regularly. Attach bath mats securely with double-sided non-slip rug tape. Do not have throw rugs and other things on the floor that can make you trip. What can I do in the bedroom? Use night lights. Make sure that you have a light by your bed that is easy to  reach. Do not use any sheets or blankets that are too big for your bed. They should not hang down onto the floor. Have a firm chair that has side arms. You can use this for support while you get dressed. Do not have throw rugs and other things on the floor that can make you trip. What can I do in the kitchen? Clean up any spills right away. Avoid walking on wet floors. Keep items that you use a lot in easy-to-reach places. If you need to reach something above you, use a strong step stool that has a grab bar. Keep electrical cords out of the way. Do not use floor polish or wax that makes floors slippery. If you must use wax, use non-skid floor wax. Do not have throw rugs and other things on the floor that can make you trip. What can I do with my stairs? Do not leave any items on the stairs. Make sure that there are handrails on both sides of the stairs and use them. Fix handrails that are broken or loose. Make sure that handrails are as long as the stairways. Check any carpeting to make sure that it is firmly attached to the stairs. Fix any carpet that is loose or worn. Avoid having throw rugs at the top or bottom of the stairs. If you do have throw rugs, attach them to the floor  with carpet tape. Make sure that you have a light switch at the top of the stairs and the bottom of the stairs. If you do not have them, ask someone to add them for you. What else can I do to help prevent falls? Wear shoes that: Do not have high heels. Have rubber bottoms. Are comfortable and fit you well. Are closed at the toe. Do not wear sandals. If you use a stepladder: Make sure that it is fully opened. Do not climb a closed stepladder. Make sure that both sides of the stepladder are locked into place. Ask someone to hold it for you, if possible. Clearly mark and make sure that you can see: Any grab bars or handrails. First and last steps. Where the edge of each step is. Use tools that help you move  around (mobility aids) if they are needed. These include: Canes. Walkers. Scooters. Crutches. Turn on the lights when you go into a dark area. Replace any light bulbs as soon as they burn out. Set up your furniture so you have a clear path. Avoid moving your furniture around. If any of your floors are uneven, fix them. If there are any pets around you, be aware of where they are. Review your medicines with your doctor. Some medicines can make you feel dizzy. This can increase your chance of falling. Ask your doctor what other things that you can do to help prevent falls. This information is not intended to replace advice given to you by your health care provider. Make sure you discuss any questions you have with your health care provider. Document Released: 10/01/2009 Document Revised: 05/12/2016 Document Reviewed: 01/09/2015 Elsevier Interactive Patient Education  2017 [employer].

## 2021-07-06 ENCOUNTER — Other Ambulatory Visit: Payer: Self-pay | Admitting: Urology

## 2021-07-06 DIAGNOSIS — N4340 Spermatocele of epididymis, unspecified: Secondary | ICD-10-CM

## 2021-07-09 NOTE — Progress Notes (Signed)
Cardiology Office Note:    Date:  07/12/2021   ID:  Tristan Hamilton, DOB July 18, 1951, MRN [id number]  PCP:  [name], MD  Cardiologist:  None  Electrophysiologist:  None   Referring MD: [name], MD   No chief complaint on file.   History of Present Illness:    Tristan Hamilton is a 70 y.o. male with a hx of prostate cancer, DVT who presents for follow-up.  He was referred by Dr. Ethlyn Gallery for evaluation of bradycardia, initially seen on 03/11/2020.  He was seen by Dr. Ethlyn Gallery for preop evaluation prior to bilateral total knee replacements.  He reported palpitations and Zio patch x2 days was ordered.  Zio patch showed sinus bradycardia down to heart rate 35 bpm.  Patient reports that he is very active.  Recently moved to [place] and has been doing multiple projects on his house and his daughter's house.  Has been cutting down trees and carrying wood.  He denies any exertional chest pain or dyspnea.  He can walk up 2 flights of stairs without stopping.  Reports that the symptoms he felt when he wore his monitor were that his heart was pounding.  He denies any lightheadedness, syncope or presyncope, chest pain, or dyspnea.  He has a history of an incidental DVT that was found in his left knee years ago, he states that he took a blood thinner for 6 months or so, and has not had recurrence.  No smoking history.  No history of heart disease in his immediate family.  TTE was done on 03/12/2020, which showed normal biventricular function, no significant valvular disease.  Since last clinic visit, he is doing great. He says his LE edema improved since his knee surgery. Denies any lightheadedness, syncope, chest pains, palpitations, or shortness of breath. He currently does not exercise however he cut trees and work around the house.    Past Medical History:  Diagnosis Date   DVT (deep venous thrombosis) (HCC)    left leg calf, 4 yrs. ago   Enlarged prostate    GERD  (gastroesophageal reflux disease)    Headache    h/o of ocular migranes   Heart murmur    History of kidney stones    Osteoarthritis of both knees    Prostate cancer [facility]) 2013   Physician in [place], had another bx. and was negative    Past Surgical History:  Procedure Laterality Date   CYSTOSCOPY WITH INSERTION OF UROLIFT     FINGER SURGERY Right    middle finger- got stitches   right knee scope  1975   skiing accident     stitches in head   TESTICLE REMOVAL Left    undescended   TONSILLECTOMY     TOTAL KNEE ARTHROPLASTY Bilateral 03/24/2020   TOTAL KNEE ARTHROPLASTY Bilateral 03/24/2020   Procedure: TOTAL KNEE BILATERAL;  Surgeon: [name], MD;  Location: [facility];  Service: Orthopedics;  Laterality: Bilateral;   VASECTOMY      Current Medications: Current Meds  Medication Sig   Cholecalciferol 125 MCG (5000 UT) capsule Take 5,000 Units by mouth daily.    ciclopirox (PENLAC) 8 % solution Apply topically at bedtime.   CIMETIDINE 200 PO Take by mouth as needed. Pt isn't sure of the dosage   naproxen sodium (ALEVE) 220 MG tablet Take 220 mg by mouth as needed.   vitamin B-12 (CYANOCOBALAMIN) 1000 MCG tablet Take 1,000 mcg by mouth daily.     Allergies:  Patient has no known allergies.   Social History   Socioeconomic History   Marital status: Married    Spouse name: Not on file   Number of children: Not on file   Years of education: Not on file   Highest education level: Not on file  Occupational History   Not on file  Tobacco Use   Smoking status: Never   Smokeless tobacco: Never  Vaping Use   Vaping Use: Never used  Substance and Sexual Activity   Alcohol use: Never   Drug use: Never   Sexual activity: Not on file  Other Topics Concern   Not on file  Social History Narrative   Not on file   Social Determinants of Health   Financial Resource Strain: Low Risk    Difficulty of Paying Living Expenses: Not hard at all  Food Insecurity: No Food  Insecurity   Worried About Running Out of Food in the Last Year: Never true   [place] in the Last Year: Never true  Transportation Needs: No Transportation Needs   Lack of Transportation (Medical): No   Lack of Transportation (Non-Medical): No  Physical Activity: Inactive   Days of Exercise per Week: 0 days   Minutes of Exercise per Session: 0 min  Stress: No Stress Concern Present   Feeling of Stress : Not at all  Social Connections: Socially Integrated   Frequency of Communication with Friends and Family: Twice a week   Frequency of Social Gatherings with Friends and Family: Twice a week   Attends Religious Services: More than 4 times per year   Active Member of [employer] or Organizations: Yes   Attends Music therapist: More than 4 times per year   Marital Status: Married     Family History: The patient's family history includes Diverticulitis in his mother; Penile cancer in his father; Prostate cancer in his maternal grandfather; Stroke (age of onset: 41) in his sister. There is no history of Colon cancer, Colon polyps, Esophageal cancer, Rectal cancer, or Stomach cancer.  ROS:   Please see the history of present illness.    (+)  All other systems reviewed and are negative.  EKGs/Labs/Other Studies Reviewed:    The following studies were reviewed today:   EKG:   07/22: sinus bradycardia, rate 47, no ST abnomalities 05/21: sinus bradycardia, rate 55, no ST/T nabormalities  TTE 03/12/20:   1. Left ventricular ejection fraction, by estimation, is 60 to 65%. The  left ventricle has normal function. The left ventricle has no regional  wall motion abnormalities. There is mild left ventricular hypertrophy.  Left ventricular diastolic parameters  were normal.   2. Right ventricular systolic function is normal. The right ventricular  size is normal. There is mildly elevated pulmonary artery systolic  pressure. The estimated right ventricular systolic pressure  is [id number] mmHg.   3. The mitral valve is normal in structure. Trivial mitral valve  regurgitation.   4. The aortic valve is tricuspid. Aortic valve regurgitation is trivial.  Mild aortic valve sclerosis is present, with no evidence of aortic valve  stenosis.   5. The inferior vena cava is normal in size with <50% respiratory  variability, suggesting right atrial pressure of 8 mmHg.   Cardiac monitor 02/24/20: Sinus bradycardia, normal sinus rhythm and sinus tachycardia. The average heart rate was 63 bpm and ranged from 35-137 Occasional PVCs, bigeminal PVCs, trigeminal PVCs Occasional PACs and nonsustained atrial tachycardia up to 4 beats. Patient symptomatic  during bradycardia with HR as low as 35bpm during awake hours.    Recent Labs: 10/22/2020: ALT 24; BUN 16; Creat 1.14; Potassium 4.5; Sodium 141  Recent Lipid Panel    Component Value Date/Time   CHOL 192 10/22/2020 1026   TRIG 125 10/22/2020 1026   HDL 60 10/22/2020 1026   CHOLHDL 3.2 10/22/2020 1026   LDLCALC 108 (H) 10/22/2020 1026    Physical Exam:    VS:  BP 112/70   Pulse (!) 47   Resp 18   Ht '5\' 10"'$  (1.778 m)   Wt 207 lb 12.8 oz (94.3 kg)   SpO2 97%   BMI 29.82 kg/m     Wt Readings from Last 3 Encounters:  07/12/21 207 lb 12.8 oz (94.3 kg)  12/22/20 200 lb (90.7 kg)  10/19/20 205 lb 6.4 oz (93.2 kg)     GEN:  Well nourished, well developed in no acute distress HEENT: Normal NECK: No JVD; No carotid bruits CARDIAC: bradycardic, regular, no murmurs, rubs, gallops RESPIRATORY:  Clear to auscultation without rales, wheezing or rhonchi  ABDOMEN: Soft, non-tender, non-distended MUSCULOSKELETAL:  No edema; No deformity  SKIN: Warm and dry NEUROLOGIC:  Alert and oriented x 3 PSYCHIATRIC:  Normal affect   ASSESSMENT:    1. Bradycardia   2. PVC's (premature ventricular contractions)   3. Hyperlipidemia, unspecified hyperlipidemia type     PLAN:    Sinus bradycardia: Heart rate down to 40s while awake on  monitor 02/2020.  There were multiple patient triggered events, which primarily corresponded to normal heart rates but there was one patient triggered event with a heart rate 40 bpm, with reported symptoms of fluttering/heart racing.  He denies any lightheadedness, syncope, presyncope, fatigue, chest pain, or dyspnea.  As does not appear symptomatic from bradycardia, no indication for pacemaker at this time.  TTE 03/12/2020 showed no structural heart disease. -Remains asymptomatic, continue to monitor   PVCs: 1.4% of beats on recent monitor.  No structural heart disease on TTE  Hyperlipidemia: LDL 108 on 10/22/2020.  10-year ASCVD risk score 13%.  Discussed starting statin, he declines at this time.  Will check calcium score for further risk stratification and recheck lipid panel   RTC in 1 year   Medication Adjustments/Labs and Tests Ordered: Current medicines are reviewed at length with the patient today.  Concerns regarding medicines are outlined above.  Orders Placed This Encounter  Procedures   CT CARDIAC SCORING (SELF PAY ONLY)   Lipid panel   EKG 12-Lead    No orders of the defined types were placed in this encounter.   Patient Instructions  Medication Instructions:  Your physician recommends that you continue on your current medications as directed. Please refer to the Current Medication list given to you today.  *If you need a refill on your cardiac medications before your next appointment, please call your pharmacy*  Testing/Procedures: CT coronary calcium score. This test is done at [address]. This is $99 out of pocket.   Coronary CalciumScan A coronary calcium scan is an imaging test used to look for deposits of calcium and other fatty materials (plaques) in the inner lining of the blood vessels of the heart (coronary arteries). These deposits of calcium and plaques can partly clog and narrow the coronary arteries without producing any symptoms or  warning signs. This puts a person at risk for a heart attack. This test can detect these deposits before symptoms develop. Tell a health care provider  about: Any allergies you have. All medicines you are taking, including vitamins, herbs, eye drops, creams, and over-the-counter medicines. Any problems you or family members have had with anesthetic medicines. Any blood disorders you have. Any surgeries you have had. Any medical conditions you have. Whether you are pregnant or may be pregnant. What are the risks? Generally, this is a safe procedure. However, problems may occur, including: Harm to a pregnant woman and her unborn baby. This test involves the use of radiation. Radiation exposure can be dangerous to a pregnant woman and her unborn baby. If you are pregnant, you generally should not have this procedure done. Slight increase in the risk of cancer. This is because of the radiation involved in the test. What happens before the procedure? No preparation is needed for this procedure. What happens during the procedure? You will undress and remove any jewelry around your neck or chest. You will put on a hospital gown. Sticky electrodes will be placed on your chest. The electrodes will be connected to an electrocardiogram (ECG) machine to record a tracing of the electrical activity of your heart. A CT scanner will take pictures of your heart. During this time, you will be asked to lie still and hold your breath for 2-3 seconds while a picture of your heart is being taken. The procedure may vary among health care providers and hospitals. What happens after the procedure? You can get dressed. You can return to your normal activities. It is up to you to get the results of your test. Ask your health care provider, or the department that is doing the test, when your results will be ready. Summary A coronary calcium scan is an imaging test used to look for deposits of calcium and other fatty  materials (plaques) in the inner lining of the blood vessels of the heart (coronary arteries). Generally, this is a safe procedure. Tell your health care provider if you are pregnant or may be pregnant. No preparation is needed for this procedure. A CT scanner will take pictures of your heart. You can return to your normal activities after the scan is done. This information is not intended to replace advice given to you by your health care provider. Make sure you discuss any questions you have with your health care provider. Document Released: 06/02/2008 Document Revised: 10/24/2016 Document Reviewed: 10/24/2016 Elsevier Interactive Patient Education  2017 [place]: At [facility], you and your health needs are our priority.  As part of our continuing mission to provide you with exceptional heart care, we have created designated Provider Care Teams.  These Care Teams include your primary Cardiologist (physician) and Advanced Practice Providers (APPs -  Physician Assistants and Nurse Practitioners) who all work together to provide you with the care you need, when you need it.  We recommend signing up for the patient portal called "MyChart".  Sign up information is provided on this After Visit Summary.  MyChart is used to connect with patients for Virtual Visits (Telemedicine).  Patients are able to view lab/test results, encounter notes, upcoming appointments, etc.  Non-urgent messages can be sent to your provider as well.   To learn more about what you can do with MyChart, go to [email or web address].    Your next appointment:   12 month(s)  The format for your next appointment:   In Person  Provider:   Oswaldo Milian, MD      Ardell Isaacs as a scribe for Donato Heinz, MD.,have  documented all relevant documentation on the behalf of Donato Heinz, MD,as directed by  Donato Heinz, MD while in the presence of Donato Heinz, MD.  I, Donato Heinz, MD, have reviewed all documentation for this visit. The documentation on 07/12/21 for the exam, diagnosis, procedures, and orders are all accurate and complete.   Signed, Donato Heinz, MD  07/12/2021 8:45 AM    [place]

## 2021-07-12 ENCOUNTER — Other Ambulatory Visit: Payer: Self-pay

## 2021-07-12 ENCOUNTER — Encounter: Payer: Self-pay | Admitting: Cardiology

## 2021-07-12 ENCOUNTER — Ambulatory Visit (INDEPENDENT_AMBULATORY_CARE_PROVIDER_SITE_OTHER): Payer: Medicare Other | Admitting: Cardiology

## 2021-07-12 VITALS — BP 112/70 | HR 47 | Resp 18 | Ht 70.0 in | Wt 207.8 lb

## 2021-07-12 DIAGNOSIS — I493 Ventricular premature depolarization: Secondary | ICD-10-CM

## 2021-07-12 DIAGNOSIS — E785 Hyperlipidemia, unspecified: Secondary | ICD-10-CM | POA: Diagnosis not present

## 2021-07-12 DIAGNOSIS — R001 Bradycardia, unspecified: Secondary | ICD-10-CM

## 2021-07-12 LAB — LIPID PANEL
Chol/HDL Ratio: 3.2 ratio (ref 0.0–5.0)
Cholesterol, Total: 188 mg/dL (ref 100–199)
HDL: 58 mg/dL (ref >39)
LDL Chol Calc (NIH): 105 mg/dL — ABNORMAL HIGH (ref 0–99)
Triglycerides: 141 mg/dL (ref 0–149)
VLDL Cholesterol Cal: 25 mg/dL (ref 5–40)

## 2021-07-12 NOTE — Patient Instructions (Signed)
Medication Instructions:  Your physician recommends that you continue on your current medications as directed. Please refer to the Current Medication list given to you today.  *If you need a refill on your cardiac medications before your next appointment, please call your pharmacy*  Testing/Procedures: CT coronary calcium score. This test is done at [address]. This is $99 out of pocket.   Coronary CalciumScan A coronary calcium scan is an imaging test used to look for deposits of calcium and other fatty materials (plaques) in the inner lining of the blood vessels of the heart (coronary arteries). These deposits of calcium and plaques can partly clog and narrow the coronary arteries without producing any symptoms or warning signs. This puts a person at risk for a heart attack. This test can detect these deposits before symptoms develop. Tell a health care provider about: Any allergies you have. All medicines you are taking, including vitamins, herbs, eye drops, creams, and over-the-counter medicines. Any problems you or family members have had with anesthetic medicines. Any blood disorders you have. Any surgeries you have had. Any medical conditions you have. Whether you are pregnant or may be pregnant. What are the risks? Generally, this is a safe procedure. However, problems may occur, including: Harm to a pregnant woman and her unborn baby. This test involves the use of radiation. Radiation exposure can be dangerous to a pregnant woman and her unborn baby. If you are pregnant, you generally should not have this procedure done. Slight increase in the risk of cancer. This is because of the radiation involved in the test. What happens before the procedure? No preparation is needed for this procedure. What happens during the procedure? You will undress and remove any jewelry around your neck or chest. You will put on a hospital gown. Sticky electrodes will be placed on  your chest. The electrodes will be connected to an electrocardiogram (ECG) machine to record a tracing of the electrical activity of your heart. A CT scanner will take pictures of your heart. During this time, you will be asked to lie still and hold your breath for 2-3 seconds while a picture of your heart is being taken. The procedure may vary among health care providers and hospitals. What happens after the procedure? You can get dressed. You can return to your normal activities. It is up to you to get the results of your test. Ask your health care provider, or the department that is doing the test, when your results will be ready. Summary A coronary calcium scan is an imaging test used to look for deposits of calcium and other fatty materials (plaques) in the inner lining of the blood vessels of the heart (coronary arteries). Generally, this is a safe procedure. Tell your health care provider if you are pregnant or may be pregnant. No preparation is needed for this procedure. A CT scanner will take pictures of your heart. You can return to your normal activities after the scan is done. This information is not intended to replace advice given to you by your health care provider. Make sure you discuss any questions you have with your health care provider. Document Released: 06/02/2008 Document Revised: 10/24/2016 Document Reviewed: 10/24/2016 Elsevier Interactive Patient Education  2017 [place]: At [facility], you and your health needs are our priority.  As part of our continuing mission to provide you with exceptional heart care, we have created designated Provider Care Teams.  These Care Teams  include your primary Cardiologist (physician) and Advanced Practice Providers (APPs -  Physician Assistants and Nurse Practitioners) who all work together to provide you with the care you need, when you need it.  We recommend signing up for the patient portal called "MyChart".   Sign up information is provided on this After Visit Summary.  MyChart is used to connect with patients for Virtual Visits (Telemedicine).  Patients are able to view lab/test results, encounter notes, upcoming appointments, etc.  Non-urgent messages can be sent to your provider as well.   To learn more about what you can do with MyChart, go to [email or web address].    Your next appointment:   12 month(s)  The format for your next appointment:   In Person  Provider:   Oswaldo Milian, MD

## 2021-07-14 ENCOUNTER — Encounter: Payer: Self-pay | Admitting: Family Medicine

## 2021-07-15 ENCOUNTER — Ambulatory Visit
Admission: RE | Admit: 2021-07-15 | Discharge: 2021-07-15 | Disposition: A | Discharge status: Home / Self Care | Payer: Medicare Other | Source: Ambulatory Visit | Attending: Urology | Admitting: Urology

## 2021-07-15 DIAGNOSIS — N4340 Spermatocele of epididymis, unspecified: Secondary | ICD-10-CM

## 2021-07-16 ENCOUNTER — Ambulatory Visit (INDEPENDENT_AMBULATORY_CARE_PROVIDER_SITE_OTHER): Payer: Medicare Other | Admitting: *Deleted

## 2021-07-16 ENCOUNTER — Encounter: Payer: Self-pay | Admitting: Sports Medicine

## 2021-07-16 ENCOUNTER — Other Ambulatory Visit: Payer: Self-pay

## 2021-07-16 DIAGNOSIS — B351 Tinea unguium: Secondary | ICD-10-CM

## 2021-07-16 NOTE — Progress Notes (Signed)
Patient presents today for the 5th laser treatment. Diagnosed with mycotic nail infection by Dr. Cannon Kettle.   Toenail most affected are all the nails on the left foot. The nails do look better.  All other systems are negative.  Nails were filed thin. Laser therapy was administered to 1-5 toenails left and patient tolerated the treatment well. All safety precautions were in place.   Patient is also using a topical.   Follow up in 8 weeks for laser # 6.

## 2021-07-27 ENCOUNTER — Other Ambulatory Visit: Payer: Self-pay

## 2021-07-27 ENCOUNTER — Ambulatory Visit (INDEPENDENT_AMBULATORY_CARE_PROVIDER_SITE_OTHER)
Admission: RE | Admit: 2021-07-27 | Discharge: 2021-07-27 | Disposition: A | Discharge status: Home / Self Care | Payer: Self-pay | Source: Ambulatory Visit | Attending: Cardiology | Admitting: Cardiology

## 2021-07-27 DIAGNOSIS — R001 Bradycardia, unspecified: Secondary | ICD-10-CM

## 2021-07-28 ENCOUNTER — Other Ambulatory Visit: Payer: Self-pay | Admitting: *Deleted

## 2021-07-28 MED ORDER — ROSUVASTATIN CALCIUM 10 MG PO TABS: 10.0000 mg | ORAL_TABLET | Freq: Every day | ORAL | 3 refills | Status: DC

## 2021-07-29 ENCOUNTER — Telehealth: Payer: Self-pay | Admitting: Sports Medicine

## 2021-07-29 MED ORDER — ROSUVASTATIN CALCIUM 10 MG PO TABS: 10.0000 mg | ORAL_TABLET | Freq: Every day | ORAL | 3 refills | Status: DC

## 2021-07-29 NOTE — Telephone Encounter (Signed)
Patient called the office stating that he has been getting treatments for his nail fungus. He states that you told him that is first 4 visits would be $100 and the next 2 would be free if needed he. He states that he has paid a total of 5 visit , he talked to our billing department and he states that they told him they didn't know anything about that.

## 2021-07-29 NOTE — Telephone Encounter (Signed)
I called the patient back to let him know and also told him if he had anymore questions or concerns to give [name] a call back

## 2021-08-01 NOTE — Telephone Encounter (Signed)
Can we send prescription through [facility] and set up a follow-up appointment in next few months?  I will send him a message about his other questions

## 2021-08-12 NOTE — Progress Notes (Signed)
Cardiology Office Note:    Date:  08/13/2021   ID:  Tristan Hamilton, DOB [birth year], MRN [id number]  PCP:  [name], MD  Cardiologist:  None  Electrophysiologist:  None   Referring MD: [name], MD   No chief complaint on file.   History of Present Illness:    Tristan Hamilton is a 70 y.o. male with a hx of prostate cancer, DVT who presents for follow-up.  He was referred by Dr. Ethlyn Gallery for evaluation of bradycardia, initially seen on 03/11/2020.  He was seen by Dr. Ethlyn Gallery for preop evaluation prior to bilateral total knee replacements.  He reported palpitations and Zio patch x2 days was ordered.  Zio patch showed sinus bradycardia down to heart rate 35 bpm.  Patient reports that he is very active.  Recently moved to [place] and has been doing multiple projects on his house and his daughter's house.  Has been cutting down trees and carrying wood.  He denies any exertional chest pain or dyspnea.  He can walk up 2 flights of stairs without stopping.  Reports that the symptoms he felt when he wore his monitor were that his heart was pounding.  He denies any lightheadedness, syncope or presyncope, chest pain, or dyspnea.  He has a history of an incidental DVT that was found in his left knee years ago, he states that he took a blood thinner for 6 months or so, and has not had recurrence.  No smoking history.  No history of heart disease in his immediate family.  TTE was done on 03/12/2020, which showed normal biventricular function, no significant valvular disease.  Calcium score on 07/27/2021 was 237 (59th percentile).  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  Denies any chest pain, dyspnea, lightheadedness, syncope, lower extremity edema, or palpitations.  Had colonoscopy on Wednesday.  Reports has not been exercising.  chest pains, palpitations, or shortness of breath. He currently does not exercise  however he cut trees and work around the house.    BP Readings from Last 3 Encounters:  08/13/21 140/90  07/12/21 112/70  10/19/20 (!) 142/80      Past Medical History:  Diagnosis Date   DVT (deep venous thrombosis) (HCC)    left leg calf, 4 yrs. ago   Enlarged prostate    GERD (gastroesophageal reflux disease)    Headache    h/o of ocular migranes   Heart murmur    History of kidney stones    Osteoarthritis of both knees    Prostate cancer [facility]) 2013   Physician in [place], had another bx. and was negative    Past Surgical History:  Procedure Laterality Date   CYSTOSCOPY WITH INSERTION OF UROLIFT     FINGER SURGERY Right    middle finger- got stitches   right knee scope  1975   skiing accident     stitches in head   TESTICLE REMOVAL Left    undescended   TONSILLECTOMY     TOTAL KNEE ARTHROPLASTY Bilateral 03/24/2020   TOTAL KNEE ARTHROPLASTY Bilateral 03/24/2020   Procedure: TOTAL KNEE BILATERAL;  Surgeon: [name], MD;  Location: [facility];  Service: Orthopedics;  Laterality: Bilateral;   VASECTOMY      Current Medications: Current Meds  Medication Sig   Cholecalciferol 125 MCG (5000 UT) capsule Take 5,000 Units by mouth daily.    ciclopirox (PENLAC) 8 % solution Apply topically at  bedtime.   CIMETIDINE 200 PO Take by mouth as needed. Pt isn't sure of the dosage   naproxen sodium (ALEVE) 220 MG tablet Take 220 mg by mouth as needed.   rosuvastatin (CRESTOR) 10 MG tablet Take 1 tablet (10 mg total) by mouth daily.   vitamin B-12 (CYANOCOBALAMIN) 1000 MCG tablet Take 1,000 mcg by mouth daily.     Allergies:   Patient has no known allergies.   Social History   Socioeconomic History   Marital status: Married    Spouse name: Not on file   Number of children: Not on file   Years of education: Not on file   Highest education level: Not on file  Occupational History   Not on file  Tobacco Use   Smoking status: Never   Smokeless tobacco: Never  Vaping  Use   Vaping Use: Never used  Substance and Sexual Activity   Alcohol use: Never   Drug use: Never   Sexual activity: Not on file  Other Topics Concern   Not on file  Social History Narrative   Not on file   Social Determinants of Health   Financial Resource Strain: Low Risk    Difficulty of Paying Living Expenses: Not hard at all  Food Insecurity: No Food Insecurity   Worried About Running Out of Food in the Last Year: Never true   [place] in the Last Year: Never true  Transportation Needs: No Transportation Needs   Lack of Transportation (Medical): No   Lack of Transportation (Non-Medical): No  Physical Activity: Inactive   Days of Exercise per Week: 0 days   Minutes of Exercise per Session: 0 min  Stress: No Stress Concern Present   Feeling of Stress : Not at all  Social Connections: Socially Integrated   Frequency of Communication with Friends and Family: Twice a week   Frequency of Social Gatherings with Friends and Family: Twice a week   Attends Religious Services: More than 4 times per year   Active Member of [employer] or Organizations: Yes   Attends Music therapist: More than 4 times per year   Marital Status: Married     Family History: The patient's family history includes Diverticulitis in his mother; Penile cancer in his father; Prostate cancer in his maternal grandfather; Stroke (age of onset: 66) in his sister. There is no history of Colon cancer, Colon polyps, Esophageal cancer, Rectal cancer, or Stomach cancer.  ROS:   Please see the history of present illness.      All other systems reviewed and are negative.  EKGs/Labs/Other Studies Reviewed:    The following studies were reviewed today:   EKG:   07/22: sinus bradycardia, rate 47, no ST abnomalities 05/21: sinus bradycardia, rate 55, no ST/T nabormalities  TTE 03/12/20:   1. Left ventricular ejection fraction, by estimation, is 60 to 65%. The  left ventricle has normal  function. The left ventricle has no regional  wall motion abnormalities. There is mild left ventricular hypertrophy.  Left ventricular diastolic parameters  were normal.   2. Right ventricular systolic function is normal. The right ventricular  size is normal. There is mildly elevated pulmonary artery systolic  pressure. The estimated right ventricular systolic pressure is [id number] mmHg.   3. The mitral valve is normal in structure. Trivial mitral valve  regurgitation.   4. The aortic valve is tricuspid. Aortic valve regurgitation is trivial.  Mild aortic valve sclerosis is present, with no evidence of  aortic valve  stenosis.   5. The inferior vena cava is normal in size with <50% respiratory  variability, suggesting right atrial pressure of 8 mmHg.   Cardiac monitor 02/24/20: Sinus bradycardia, normal sinus rhythm and sinus tachycardia. The average heart rate was 63 bpm and ranged from 35-137 Occasional PVCs, bigeminal PVCs, trigeminal PVCs Occasional PACs and nonsustained atrial tachycardia up to 4 beats. Patient symptomatic during bradycardia with HR as low as 35bpm during awake hours.    Recent Labs: 10/22/2020: ALT 24; BUN 16; Creat 1.14; Potassium 4.5; Sodium 141  Recent Lipid Panel    Component Value Date/Time   CHOL 188 07/12/2021 0904   TRIG 141 07/12/2021 0904   HDL 58 07/12/2021 0904   CHOLHDL 3.2 07/12/2021 0904   CHOLHDL 3.2 10/22/2020 1026   LDLCALC 105 (H) 07/12/2021 0904   LDLCALC 108 (H) 10/22/2020 1026    Physical Exam:    VS:  BP 140/90 (BP Location: Left Arm, Patient Position: Sitting, Cuff Size: Normal)   Pulse (!) 49   Ht '5\' 10"'$  (1.778 m)   Wt 202 lb 6.4 oz (91.8 kg)   SpO2 96%   BMI 29.04 kg/m     Wt Readings from Last 3 Encounters:  08/13/21 202 lb 6.4 oz (91.8 kg)  07/12/21 207 lb 12.8 oz (94.3 kg)  12/22/20 200 lb (90.7 kg)     GEN:  Well nourished, well developed in no acute distress HEENT: Normal NECK: No JVD; No carotid bruits CARDIAC:  bradycardic, regular, no murmurs, rubs, gallops RESPIRATORY:  Clear to auscultation without rales, wheezing or rhonchi  ABDOMEN: Soft, non-tender, non-distended MUSCULOSKELETAL:  No edema; No deformity  SKIN: Warm and dry NEUROLOGIC:  Alert and oriented x 3 PSYCHIATRIC:  Normal affect   ASSESSMENT:    1. Hyperlipidemia, unspecified hyperlipidemia type   2. Aortic dilatation (HCC)   3. Bradycardia   4. Elevated BP without diagnosis of hypertension      PLAN:    Sinus bradycardia: Heart rate down to 40s while awake on monitor 02/2020.  There were multiple patient triggered events, which primarily corresponded to normal heart rates but there was one patient triggered event with a heart rate 40 bpm, with reported symptoms of fluttering/heart racing.  He denies any lightheadedness, syncope, presyncope, fatigue, chest pain, or dyspnea.  As does not appear symptomatic from bradycardia, no indication for pacemaker at this time.  TTE 03/12/2020 showed no structural heart disease. -Remains asymptomatic, continue to monitor   PVCs: 1.4% of beats on recent monitor.  No structural heart disease on TTE  Hyperlipidemia: LDL 108 on 10/22/2020.  10-year ASCVD risk score 13%.  Discussed starting statin, he declined initially.  Calcium score on 07/27/2021 was 237 (59th percentile).  Recommend starting rosuvastatin 10 mg daily, he is agreeable.  Will recheck lipid panel in 2-3 months, goal LD <70  Aortic dilatation: Ascending aorta measuring 40 mm on CTA 07/27/21.  Repeat CTA chest in 1 year  Elevated BP: BP mildly elevated in clinic today, no history of hypertension.  Asked to check BP twice daily for next week and call with results.   RTC in 1 year   Medication Adjustments/Labs and Tests Ordered: Current medicines are reviewed at length with the patient today.  Concerns regarding medicines are outlined above.  Orders Placed This Encounter  Procedures   CT ANGIO CHEST AORTA W/CM & OR WO/CM   Lipid panel      No orders of the defined types were placed in  this encounter.   Patient Instructions  Medication Instructions:  Your physician recommends that you continue on your current medications as directed. Please refer to the Current Medication list given to you today.  *If you need a refill on your cardiac medications before your next appointment, please call your pharmacy*   Lab Work: Please return for FASTING labs in 2-3 months (Lipid)  Our in office lab hours are Monday-Friday 8:00-4:00, closed for lunch 12:45-1:45 pm.  No appointment needed.  Testing/Procedures: CTA chest/aorta in 1 year  Follow-Up: At [facility], you and your health needs are our priority.  As part of our continuing mission to provide you with exceptional heart care, we have created designated Provider Care Teams.  These Care Teams include your primary Cardiologist (physician) and Advanced Practice Providers (APPs -  Physician Assistants and Nurse Practitioners) who all work together to provide you with the care you need, when you need it.  We recommend signing up for the patient portal called "MyChart".  Sign up information is provided on this After Visit Summary.  MyChart is used to connect with patients for Virtual Visits (Telemedicine).  Patients are able to view lab/test results, encounter notes, upcoming appointments, etc.  Non-urgent messages can be sent to your provider as well.   To learn more about what you can do with MyChart, go to [email or web address].    Your next appointment:   12 month(s)  The format for your next appointment:   In Person  Provider:   Oswaldo Milian, MD   Other Instructions Recommend Omron upper arm cuff for home blood pressure monitoring  Please check your blood pressure at home two times daily, write it down.  Call the office or send message via Mychart with the readings in 1 week for Dr. Gardiner Rhyme to review.       Signed, Donato Heinz, MD   08/13/2021 10:44 AM    [facility]

## 2021-08-13 ENCOUNTER — Other Ambulatory Visit: Payer: Self-pay

## 2021-08-13 ENCOUNTER — Ambulatory Visit (INDEPENDENT_AMBULATORY_CARE_PROVIDER_SITE_OTHER): Payer: Medicare Other | Admitting: Cardiology

## 2021-08-13 VITALS — BP 140/90 | HR 49 | Ht 70.0 in | Wt 202.4 lb

## 2021-08-13 DIAGNOSIS — E785 Hyperlipidemia, unspecified: Secondary | ICD-10-CM

## 2021-08-13 DIAGNOSIS — I77819 Aortic ectasia, unspecified site: Secondary | ICD-10-CM | POA: Diagnosis not present

## 2021-08-13 DIAGNOSIS — R001 Bradycardia, unspecified: Secondary | ICD-10-CM | POA: Diagnosis not present

## 2021-08-13 DIAGNOSIS — R030 Elevated blood-pressure reading, without diagnosis of hypertension: Secondary | ICD-10-CM

## 2021-08-13 NOTE — Patient Instructions (Signed)
Medication Instructions:  Your physician recommends that you continue on your current medications as directed. Please refer to the Current Medication list given to you today.  *If you need a refill on your cardiac medications before your next appointment, please call your pharmacy*   Lab Work: Please return for FASTING labs in 2-3 months (Lipid)  Our in office lab hours are Monday-Friday 8:00-4:00, closed for lunch 12:45-1:45 pm.  No appointment needed.  Testing/Procedures: CTA chest/aorta in 1 year  Follow-Up: At [facility], you and your health needs are our priority.  As part of our continuing mission to provide you with exceptional heart care, we have created designated Provider Care Teams.  These Care Teams include your primary Cardiologist (physician) and Advanced Practice Providers (APPs -  Physician Assistants and Nurse Practitioners) who all work together to provide you with the care you need, when you need it.  We recommend signing up for the patient portal called "MyChart".  Sign up information is provided on this After Visit Summary.  MyChart is used to connect with patients for Virtual Visits (Telemedicine).  Patients are able to view lab/test results, encounter notes, upcoming appointments, etc.  Non-urgent messages can be sent to your provider as well.   To learn more about what you can do with MyChart, go to [email or web address].    Your next appointment:   12 month(s)  The format for your next appointment:   In Person  Provider:   Oswaldo Milian, MD   Other Instructions Recommend Omron upper arm cuff for home blood pressure monitoring  Please check your blood pressure at home two times daily, write it down.  Call the office or send message via Mychart with the readings in 1 week for Dr. Gardiner Rhyme to review.

## 2021-09-10 ENCOUNTER — Other Ambulatory Visit: Payer: Self-pay

## 2021-09-10 ENCOUNTER — Ambulatory Visit (INDEPENDENT_AMBULATORY_CARE_PROVIDER_SITE_OTHER): Payer: Medicare Other

## 2021-09-10 DIAGNOSIS — B351 Tinea unguium: Secondary | ICD-10-CM

## 2021-09-10 NOTE — Patient Instructions (Signed)

## 2021-09-10 NOTE — Progress Notes (Signed)
Patient presents today for the 6th laser treatment. Diagnosed with mycotic nail infection by Dr. Cannon Kettle.   Toenail most affected are all the nails on the left foot. The nails do look better.  All other systems are negative.  Nails were filed thin. Laser therapy was administered to 1-5 toenails left and patient tolerated the treatment well. All safety precautions were in place.   Patient is also using a topical.   Patient has completed the recommended laser treatments. He will follow up with Dr. Cannon Kettle in 3 months to evaluate progress.

## 2021-09-14 ENCOUNTER — Telehealth: Payer: Self-pay

## 2021-09-14 DIAGNOSIS — E785 Hyperlipidemia, unspecified: Secondary | ICD-10-CM

## 2021-09-14 NOTE — Telephone Encounter (Signed)
Tristan Hamilton called asking if Dr. Ethlyn Gallery would prescribe Amoxicillin for an upcoming dental appt. Pt was told he should take after having knee surgery to avoid infection in knees. I informed Tristan Hamilton to contact the the doctor who prescribed it but Tristan Hamilton said he wanted to discuss with PCP.

## 2021-09-15 MED ORDER — ATORVASTATIN CALCIUM 20 MG PO TABS: 20.0000 mg | ORAL_TABLET | Freq: Every day | ORAL | 3 refills | Status: DC

## 2021-09-15 NOTE — Telephone Encounter (Signed)
Recommend switching to atorvastatin 20 mg daily

## 2021-09-15 NOTE — Telephone Encounter (Signed)
  There is not routine recommendation for antibiotics after knee replacement; I have rechecked updated guidelines. Here is the information from our medical database. Just copied those for him. If dentist or ortho feel that there is significant increased risk for him in particular, then I would suggest getting antibiotics from them. But really shouldn't need it and taking unnecessary antibiotics is not advised as it comes with its own risk.  Dental procedures -- Based on available data and consistent with expert guidelines from both the [place] (ADA) and the American Academy of Orthopedic Surgeons (AAOS), we advise against the routine use of antibiotic prophylaxis in patients with prosthetic joints undergoing dental procedures [38,39]. Dental procedures are not associated with an increased risk of orthopedic hardware infection, and use of routine antibiotic prophylaxis prior to dental procedures, including invasive procedures, does not alter the risk of subsequent orthopedic hardware infection [39-46]. We recommend maintaining good oral hygiene so that bacteremia due to oral flora is less likely to occur during routine daily oral care [47]. In addition, we recommend that dental infections be promptly treated. Treatment of dental infections is discussed in detail elsewhere.

## 2021-09-15 NOTE — Telephone Encounter (Signed)
Patient informed of the message below.

## 2021-10-15 ENCOUNTER — Encounter: Payer: Self-pay | Admitting: Family Medicine

## 2021-10-17 NOTE — Telephone Encounter (Signed)
Recommend referral to pharmacy lipid clinic given intolerance to statins

## 2021-10-18 NOTE — Addendum Note (Signed)
Addended by: Patria Mane A on: 10/18/2021 03:20 PM   Modules accepted: Orders

## 2021-10-20 ENCOUNTER — Encounter: Payer: Medicare Other | Admitting: Family Medicine

## 2021-10-22 LAB — LIPID PANEL
Chol/HDL Ratio: 3.7 ratio (ref 0.0–5.0)
Cholesterol, Total: 180 mg/dL (ref 100–199)
HDL: 49 mg/dL (ref >39)
LDL Chol Calc (NIH): 115 mg/dL — ABNORMAL HIGH (ref 0–99)
Triglycerides: 88 mg/dL (ref 0–149)
VLDL Cholesterol Cal: 16 mg/dL (ref 5–40)

## 2021-10-27 ENCOUNTER — Encounter: Payer: Self-pay | Admitting: Family Medicine

## 2021-10-27 ENCOUNTER — Other Ambulatory Visit: Payer: Self-pay

## 2021-10-27 ENCOUNTER — Ambulatory Visit (INDEPENDENT_AMBULATORY_CARE_PROVIDER_SITE_OTHER): Payer: Medicare Other | Admitting: Family Medicine

## 2021-10-27 VITALS — BP 132/68 | HR 69 | Temp 98.1°F | Ht 70.5 in | Wt 198.0 lb

## 2021-10-27 DIAGNOSIS — M353 Polymyalgia rheumatica: Secondary | ICD-10-CM

## 2021-10-27 DIAGNOSIS — Z131 Encounter for screening for diabetes mellitus: Secondary | ICD-10-CM

## 2021-10-27 DIAGNOSIS — Z0000 Encounter for general adult medical examination without abnormal findings: Secondary | ICD-10-CM | POA: Diagnosis not present

## 2021-10-27 DIAGNOSIS — M791 Myalgia, unspecified site: Secondary | ICD-10-CM | POA: Diagnosis not present

## 2021-10-27 DIAGNOSIS — C61 Malignant neoplasm of prostate: Secondary | ICD-10-CM

## 2021-10-27 DIAGNOSIS — L723 Sebaceous cyst: Secondary | ICD-10-CM

## 2021-10-27 LAB — COMPREHENSIVE METABOLIC PANEL
ALT: 29 U/L (ref 0–53)
AST: 24 U/L (ref 0–37)
Albumin: 4.2 g/dL (ref 3.5–5.2)
Alkaline Phosphatase: 85 U/L (ref 39–117)
BUN: 16 mg/dL (ref 6–23)
CO2: 31 mEq/L (ref 19–32)
Calcium: 9.7 mg/dL (ref 8.4–10.5)
Chloride: 99 mEq/L (ref 96–112)
Creatinine, Ser: 1 mg/dL (ref 0.40–1.50)
GFR: 76.16 mL/min (ref >60.00)
Glucose, Bld: 104 mg/dL — ABNORMAL HIGH (ref 70–99)
Potassium: 4.3 mEq/L (ref 3.5–5.1)
Sodium: 138 mEq/L (ref 135–145)
Total Bilirubin: 0.8 mg/dL (ref 0.2–1.2)
Total Protein: 6.9 g/dL (ref 6.0–8.3)

## 2021-10-27 LAB — CBC WITH DIFFERENTIAL/PLATELET
Basophils Absolute: 0 10*3/uL (ref 0.0–0.1)
Basophils Relative: 0.4 % (ref 0.0–3.0)
Eosinophils Absolute: 0.1 10*3/uL (ref 0.0–0.7)
Eosinophils Relative: 1.1 % (ref 0.0–5.0)
HCT: 47.2 % (ref 39.0–52.0)
Hemoglobin: 15.9 g/dL (ref 13.0–17.0)
Lymphocytes Relative: 21 % (ref 12.0–46.0)
Lymphs Abs: 1.5 10*3/uL (ref 0.7–4.0)
MCHC: 33.8 g/dL (ref 30.0–36.0)
MCV: 92.5 fl (ref 78.0–100.0)
Monocytes Absolute: 1 10*3/uL (ref 0.1–1.0)
Monocytes Relative: 14.5 % — ABNORMAL HIGH (ref 3.0–12.0)
Neutro Abs: 4.4 10*3/uL (ref 1.4–7.7)
Neutrophils Relative %: 63 % (ref 43.0–77.0)
Platelets: 230 10*3/uL (ref 150.0–400.0)
RBC: 5.11 Mil/uL (ref 4.22–5.81)
RDW: 12.8 % (ref 11.5–15.5)
WBC: 7 10*3/uL (ref 4.0–10.5)

## 2021-10-27 LAB — SEDIMENTATION RATE: Sed Rate: 13 mm/hr (ref 0–20)

## 2021-10-27 LAB — C-REACTIVE PROTEIN: CRP: 3.3 mg/dL (ref 0.5–20.0)

## 2021-10-27 LAB — CK: Total CK: 43 U/L (ref 7–232)

## 2021-10-27 MED ORDER — PREDNISONE 20 MG PO TABS: ORAL_TABLET | ORAL | 0 refills | Status: DC

## 2021-10-27 MED ORDER — METHYLPREDNISOLONE ACETATE 80 MG/ML IJ SUSP
80.0000 mg | Freq: Once | INTRAMUSCULAR | Status: DC
Start: 2021-10-27 — End: 2021-10-27

## 2021-10-27 MED ORDER — METHYLPREDNISOLONE ACETATE 80 MG/ML IJ SUSP
60.0000 mg | Freq: Once | INTRAMUSCULAR | Status: AC
Start: 2021-10-27 — End: 2021-10-27
  Administered 2021-10-27: 60 mg via INTRAMUSCULAR

## 2021-10-27 NOTE — Addendum Note (Signed)
Addended by: [name] on: 10/27/2021 05:03 PM   Modules accepted: Orders

## 2021-10-27 NOTE — Progress Notes (Signed)
Tristan Hamilton DOB: 07-Oct-1951 Encounter date: 10/27/2021  This is a 70 y.o. male who presents for complete physical   History of present illness/Additional concerns: Last visit was 1 year ago for complete physical.  Hx of prostate CA; follows with [facility] for q 6 mo PSA; last bx in Feb 2021 negative  Not doing well: significant muscle aches. End of august saw cardiology and discovered some plaque on valve and prescribed crestor. Beginning September started taking crestor and started to get muscle aches with this. Switched to lipitor 10/5 and no change/kept getting worse. Stopped all of medication and hasn't anything since then but pain hasn't stopped and feels like it is getting worse. Has been off everything for at least 2 weeks now; probably 3 weeks. Mainly shoulders, arms, hips, backs of legs. Slightly loser now after hot shower this morning; neck is getting stiff. Drove to [place] last weekend and just raising arm for term signal was painful. Never ached like this before. Hands, feet don't bother. No headache.   In last couple of week has felt flu like - last week took temp and it was 100.2, 99. No other sick symptoms.   No changes in urine or stool color.   Worst stiffness in morning; movement doesn't improve. Some relief with aleve, did take leftover oxy.   Prior to all of above, never felt better.   Had colonoscopy done in [place] in august. Repeat in 5 years due to polyp.   Past Medical History:  Diagnosis Date   DVT (deep venous thrombosis) (HCC)    left leg calf, 4 yrs. ago   Enlarged prostate    GERD (gastroesophageal reflux disease)    Headache    h/o of ocular migranes   Heart murmur    History of kidney stones    Osteoarthritis of both knees    Prostate cancer [facility]) 2013   Physician in [place], had another bx. and was negative   Past Surgical History:  Procedure Laterality Date   CYSTOSCOPY WITH INSERTION OF UROLIFT     FINGER SURGERY Right    middle finger- got  stitches   right knee scope  1975   skiing accident     stitches in head   TESTICLE REMOVAL Left    undescended   TONSILLECTOMY     TOTAL KNEE ARTHROPLASTY Bilateral 03/24/2020   TOTAL KNEE ARTHROPLASTY Bilateral 03/24/2020   Procedure: TOTAL KNEE BILATERAL;  Surgeon: [name], MD;  Location: [place];  Service: Orthopedics;  Laterality: Bilateral;   VASECTOMY     No Known Allergies Current Meds  Medication Sig   Cholecalciferol 125 MCG (5000 UT) capsule Take 5,000 Units by mouth daily.    CIMETIDINE 200 PO Take by mouth as needed. Pt isn't sure of the dosage   naproxen sodium (ALEVE) 220 MG tablet Take 220 mg by mouth as needed.   vitamin B-12 (CYANOCOBALAMIN) 50 MCG tablet Take 50 mcg by mouth daily.   [DISCONTINUED] vitamin B-12 (CYANOCOBALAMIN) 1000 MCG tablet Take 1,000 mcg by mouth daily.   Social History   Tobacco Use   Smoking status: Never   Smokeless tobacco: Never  Substance Use Topics   Alcohol use: Never   Family History  Problem Relation Age of Onset   Diverticulitis Mother    Penile cancer Father    Stroke Sister 70       covid, then massive stroke   Prostate cancer Maternal Grandfather    Colon cancer Neg Hx  Colon polyps Neg Hx    Esophageal cancer Neg Hx    Rectal cancer Neg Hx    Stomach cancer Neg Hx     Review of Systems  Constitutional:  Negative for activity change, appetite change, chills, fatigue, fever and unexpected weight change.  HENT:  Negative for congestion, ear pain, hearing loss, sinus pressure, sinus pain, sore throat and trouble swallowing.   Eyes:  Negative for pain and visual disturbance.  Respiratory:  Negative for cough, chest tightness, shortness of breath and wheezing.   Cardiovascular:  Negative for chest pain, palpitations and leg swelling.  Gastrointestinal:  Negative for abdominal distention, abdominal pain, blood in stool, constipation, diarrhea, nausea and vomiting.  Genitourinary:  Negative for decreased  urine volume, difficulty urinating, dysuria, penile pain and testicular pain.  Musculoskeletal:  Positive for arthralgias (shoulders, hips, hamstrings). Negative for back pain and joint swelling.  Skin:  Negative for rash.  Neurological:  Negative for dizziness, weakness, numbness and headaches.  Hematological:  Negative for adenopathy. Does not bruise/bleed easily.  Psychiatric/Behavioral:  Negative for agitation, sleep disturbance and suicidal ideas. The patient is not nervous/anxious.    CBC:  Lab Results  Component Value Date   WBC 7.0 10/27/2021   HGB 15.9 10/27/2021   HCT 47.2 10/27/2021   MCH 30.8 03/30/2020   MCHC 33.8 10/27/2021   RDW 12.8 10/27/2021   PLT 230.0 10/27/2021   MPV 11.1 02/05/2020   CMP: Lab Results  Component Value Date   NA 138 10/27/2021   K 4.3 10/27/2021   CL 99 10/27/2021   CO2 31 10/27/2021   ANIONGAP 7 03/26/2020   GLUCOSE 104 (H) 10/27/2021   BUN 16 10/27/2021   CREATININE 1.00 10/27/2021   CREATININE 1.14 10/22/2020   GFRAA >60 03/26/2020   CALCIUM 9.7 10/27/2021   PROT 6.9 10/27/2021   BILITOT 0.8 10/27/2021   ALKPHOS 85 10/27/2021   ALT 29 10/27/2021   AST 24 10/27/2021   LIPID: Lab Results  Component Value Date   CHOL 180 10/22/2021   TRIG 88 10/22/2021   HDL 49 10/22/2021   LDLCALC 115 (H) 10/22/2021   LDLCALC 108 (H) 10/22/2020   LABVLDL 16 10/22/2021    Objective:  BP 132/68 (BP Location: Left Arm, Patient Position: Sitting, Cuff Size: Large)   Pulse 69   Temp 98.1 F (36.7 C) (Oral)   Ht 5' 10.5" (1.791 m)   Wt 198 lb (89.8 kg)   SpO2 96%   BMI 28.01 kg/m   Weight: 198 lb (89.8 kg)   BP Readings from Last 3 Encounters:  10/27/21 132/68  08/13/21 140/90  07/12/21 112/70   Wt Readings from Last 3 Encounters:  10/27/21 198 lb (89.8 kg)  08/13/21 202 lb 6.4 oz (91.8 kg)  07/12/21 207 lb 12.8 oz (94.3 kg)    Physical Exam Constitutional:      General: He is not in acute distress.    Appearance: He is  well-developed.  HENT:     Head: Normocephalic and atraumatic.     Right Ear: External ear normal.     Left Ear: External ear normal.     Nose: Nose normal.     Mouth/Throat:     Pharynx: No oropharyngeal exudate.  Eyes:     Conjunctiva/sclera: Conjunctivae normal.     Pupils: Pupils are equal, round, and reactive to light.  Neck:     Thyroid: No thyromegaly.  Cardiovascular:     Rate and Rhythm: Normal rate and regular  rhythm.     Heart sounds: Normal heart sounds. No murmur heard.   No friction rub. No gallop.  Pulmonary:     Effort: Pulmonary effort is normal. No respiratory distress.     Breath sounds: Normal breath sounds. No stridor. No wheezing or rales.  Abdominal:     General: Bowel sounds are normal.     Palpations: Abdomen is soft.  Musculoskeletal:        General: Normal range of motion.     Cervical back: Neck supple.     Comments: He has a hard time abducting shoulders over 90 degrees.  He has hard time standing up from a seated position.  Once up and moving, mobility is easier.  No tenderness with palpation of shoulder or hip joints.  Skin:    General: Skin is warm and dry.     Comments: There is 2.5cm sebaceous cyst right back  Neurological:     Mental Status: He is alert and oriented to person, place, and time.  Psychiatric:        Behavior: Behavior normal.        Thought Content: Thought content normal.        Judgment: Judgment normal.    Assessment/Plan: Health Maintenance Due  Topic Date Due   URINE MICROALBUMIN  Never done   TETANUS/TDAP  Never done   Pneumonia Vaccine 37+ Years old (2 - PCV) 02/04/2021   Health Maintenance reviewed. Declines flu shot today.  1. Preventative health care Keep up with active lifestyle, healthy eating.   2. Polymyalgia rheumatica (HCC) I suspect polymyalgia given the pain location and sudden severity, although timing is interesting with starting statin - perhaps started as statin side effect and progressed? Will  treat with steroid shot today in office, then obtain bloodwork to determine further treatment/follow up.  - Sedimentation rate; Future - methylPREDNISolone acetate (DEPO-MEDROL) injection 60 mg  3. Muscle pain See above.  - CK; Future - CBC with Differential/Platelet; Future - ANA; Future - C-reactive Protein; Future  4. Prostate cancer [facility]) Follows with urology regularly.   5. Screening for diabetes mellitus - Comprehensive metabolic panel; Future  6. Sebaceous cyst - Ambulatory referral to Dermatology   Return for pending labwork.  Micheline Rough, MD

## 2021-10-28 LAB — ANA: Anti Nuclear Antibody (ANA): NEGATIVE

## 2021-11-09 ENCOUNTER — Ambulatory Visit (INDEPENDENT_AMBULATORY_CARE_PROVIDER_SITE_OTHER): Payer: Medicare Other | Admitting: Pharmacist Clinician (PhC)/ Clinical Pharmacy Specialist

## 2021-11-09 ENCOUNTER — Other Ambulatory Visit: Payer: Self-pay

## 2021-11-09 VITALS — BP 132/80 | HR 62 | Resp 15 | Ht 70.0 in | Wt 200.2 lb

## 2021-11-09 DIAGNOSIS — E785 Hyperlipidemia, unspecified: Secondary | ICD-10-CM | POA: Diagnosis not present

## 2021-11-09 NOTE — Patient Instructions (Addendum)
Your Results:             Your most recent labs Goal  Total Cholesterol 180 < 200  Triglycerides 88 < 150  HDL (happy/good cholesterol) 49 > 40  LDL (lousy/bad cholesterol 115 < 70      Medication changes:  We will start the process to get Nexletol covered by your insurance.  Take 1 tablet daily Lab orders:  Have Uric acid and kidney function labs checked about 1 month after starting.  Repeat cholesterol labs after 3 months.   Patient Assistance:  The [employer] foundation offers assistance to help pay for medication copays.  They will cover copays for all cholesterol lowering meds, including statins, fibrates, omega-3 oils, ezetimibe, Repatha, Praluent, Nexletol, Nexlizet.  The cards are usually good for $2,500 or 12 months, whichever comes first. Go to healthwellfoundation.org Click on "Apply Now" Answer questions as to whom is applying (patient or representative) Your disease fund will be "hypercholesterolemia - Medicare access" They will ask questions about finances and which medications you are taking for cholesterol When you submit, the approval is usually within minutes.  You will need to print the card information from the site You will need to show this information to your pharmacy, they will bill your Medicare Part D plan first -then bill [employer] --for the copay.   You can also call them at [phone], although the hold times can be quite long.   Thank you for choosing [facility]

## 2021-11-09 NOTE — Progress Notes (Signed)
11/10/2021 Jovin Fester 10/02/1951 [id number]   HPI:  Tristan Hamilton is a 70 y.o. male patient of Dr Gardiner Rhyme, who presents today for a lipid clinic evaluation.  He had a recent coronary calcium scoring showed a total score of 237 (59th percentile), with all of the scoring coming from the left anterior descending artery.   He was started on rosuvastatin, but this led to myalgias in both hips, down to knees and hamstrings, as well as shoulders.  Rosuvastatin was stopped and he was then given atorvastatin.  Unfortunately the same reaction occurred within days of starting that.   He is in the office today to discuss other options for cholesterol lowering.    Prescription Insurance:  [facility] BIN [id number] PCN [employer] GRP BROOMEDDRX  ID [id number]  Current Medications: none  Cholesterol Goals: LDL < 70   Intolerant/previously tried: atorvastatin, rosuvastatin  Family history: sister with stroke at 91  Labs: 11/22: TC 180, TG 88, HDL 49, LDL 115    Current Outpatient Medications  Medication Sig Dispense Refill   amoxicillin (AMOXIL) 500 MG tablet Take 4 tablets by mouth as needed.     Bempedoic Acid (NEXLETOL) 180 MG TABS Take 180 mg by mouth daily. 21 tablet 0   Cholecalciferol 125 MCG (5000 UT) capsule Take 5,000 Units by mouth daily.      CIMETIDINE 200 PO Take by mouth as needed. Pt isn't sure of the dosage     naproxen sodium (ALEVE) 220 MG tablet Take 220 mg by mouth as needed.     vitamin B-12 (CYANOCOBALAMIN) 50 MCG tablet Take 50 mcg by mouth daily.     predniSONE (DELTASONE) 20 MG tablet Take 3 tabs PO daily x 2 days, 2 tabs PO daily x 3 days, 1 tab PO daily x 3 days, 1/2 tab PO daily x 2 days (Patient not taking: Reported on 11/09/2021) 16 tablet 0   No current facility-administered medications for this visit.    Allergies  Allergen Reactions   Crestor [Rosuvastatin]     MYALGIAS   Lipitor [Atorvastatin]     MYLAGIAS    Past Medical History:  Diagnosis  Date   DVT (deep venous thrombosis) (HCC)    left leg calf, 4 yrs. ago   Enlarged prostate    GERD (gastroesophageal reflux disease)    Headache    h/o of ocular migranes   Heart murmur    History of kidney stones    Osteoarthritis of both knees    Prostate cancer [facility]) 2013   Physician in [place], had another bx. and was negative    Blood pressure 132/80, pulse 62, resp. rate 15, height 5\' 10"  (1.778 m), weight 200 lb 3.2 oz (90.8 kg), SpO2 97 %.   Hyperlipidemia Patient with CAD and LDL not at goal, currently at 115.  Has been intolerant to 2 different statin drugs, due to myalgias.  Reviewed options for lowering LDL cholesterol, including ezetimibe, PCSK-9 inhibitors, bempedoic acid and inclisiran.  Discussed mechanisms of action, dosing, side effects and potential decreases in LDL cholesterol.  Also reviewed cost information and potential options for patient assistance.  Answered all patient questions.  Based on this information, patient would prefer to start Nexletol.  Samples x 3 weeks were given and we will start process for PA today.  Patient aware to have metabolic panel and uric acid drawn after 3-4 weeks, then lipids and liver function in 2-3 months.    Tommy Medal PharmD CPP [facility]  [address] [place], [place] [address] [phone]

## 2021-11-10 ENCOUNTER — Telehealth: Payer: Self-pay

## 2021-11-10 ENCOUNTER — Encounter: Payer: Self-pay | Admitting: Pharmacist Clinician (PhC)/ Clinical Pharmacy Specialist

## 2021-11-10 DIAGNOSIS — E785 Hyperlipidemia, unspecified: Secondary | ICD-10-CM

## 2021-11-10 DIAGNOSIS — N281 Cyst of kidney, acquired: Secondary | ICD-10-CM

## 2021-11-10 MED ORDER — NEXLETOL 180 MG PO TABS: 180.0000 mg | ORAL_TABLET | Freq: Every day | ORAL | 3 refills | Status: DC

## 2021-11-10 MED ORDER — NEXLETOL 180 MG PO TABS
180.0000 mg | ORAL_TABLET | Freq: Every day | ORAL | 0 refills | Status: DC
Start: 2021-11-10 — End: 2021-11-10

## 2021-11-10 NOTE — Telephone Encounter (Signed)
Called and spoke w/pt and stated that they were approved for nexletol, [facility], instructed the pt that they need to complete labs in 2-6 weeks for uric acid and bmet, then againafter 02/10/22 for lipid and lft. Pt voiced understanding

## 2021-11-10 NOTE — Assessment & Plan Note (Signed)
Patient with CAD and LDL not at goal, currently at 115.  Has been intolerant to 2 different statin drugs, due to myalgias.  Reviewed options for lowering LDL cholesterol, including ezetimibe, PCSK-9 inhibitors, bempedoic acid and inclisiran.  Discussed mechanisms of action, dosing, side effects and potential decreases in LDL cholesterol.  Also reviewed cost information and potential options for patient assistance.  Answered all patient questions.  Based on this information, patient would prefer to start Nexletol.  Samples x 3 weeks were given and we will start process for PA today.  Patient aware to have metabolic panel and uric acid drawn after 3-4 weeks, then lipids and liver function in 2-3 months.

## 2021-11-11 ENCOUNTER — Encounter: Payer: Self-pay | Admitting: Family Medicine

## 2021-11-11 DIAGNOSIS — M791 Myalgia, unspecified site: Secondary | ICD-10-CM

## 2021-11-11 DIAGNOSIS — M2550 Pain in unspecified joint: Secondary | ICD-10-CM

## 2021-11-15 MED ORDER — PREDNISONE 20 MG PO TABS: ORAL_TABLET | ORAL | 0 refills | Status: DC

## 2021-11-17 ENCOUNTER — Encounter: Payer: Self-pay | Admitting: Family Medicine

## 2021-12-03 ENCOUNTER — Ambulatory Visit: Payer: Medicare Other | Admitting: Sports Medicine

## 2021-12-07 LAB — URIC ACID: Uric Acid: 6.6 mg/dL (ref 3.8–8.4)

## 2021-12-07 LAB — BASIC METABOLIC PANEL
BUN/Creatinine Ratio: 13 (ref 10–24)
BUN: 13 mg/dL (ref 8–27)
CO2: 29 mmol/L (ref 20–29)
Calcium: 10.1 mg/dL (ref 8.6–10.2)
Chloride: 101 mmol/L (ref 96–106)
Creatinine, Ser: 0.97 mg/dL (ref 0.76–1.27)
Glucose: 133 mg/dL — ABNORMAL HIGH (ref 70–99)
Potassium: 4.2 mmol/L (ref 3.5–5.2)
Sodium: 143 mmol/L (ref 134–144)
eGFR: 84 mL/min/{1.73_m2} (ref >59)

## 2021-12-29 ENCOUNTER — Encounter: Payer: Self-pay | Admitting: Sports Medicine

## 2021-12-29 ENCOUNTER — Other Ambulatory Visit: Payer: Self-pay

## 2021-12-29 ENCOUNTER — Ambulatory Visit (INDEPENDENT_AMBULATORY_CARE_PROVIDER_SITE_OTHER): Payer: Medicare Other | Admitting: Sports Medicine

## 2021-12-29 DIAGNOSIS — M79675 Pain in left toe(s): Secondary | ICD-10-CM | POA: Diagnosis not present

## 2021-12-29 DIAGNOSIS — B351 Tinea unguium: Secondary | ICD-10-CM | POA: Diagnosis not present

## 2021-12-29 DIAGNOSIS — M79674 Pain in right toe(s): Secondary | ICD-10-CM | POA: Diagnosis not present

## 2021-12-29 NOTE — Progress Notes (Signed)
Subjective: Tristan Hamilton is a 71 y.o. male patient seen today in office for follow up after laser. Reports that he sees some difference no other issues noted.   Patient Active Problem List   Diagnosis Date Noted   Hyperlipidemia 11/10/2021   OA (osteoarthritis) of knee 03/24/2020   Status post total bilateral knee replacement 03/24/2020   Acquired renal cyst of right kidney 01/21/2020   Prostate cancer ([place]) 11/01/2019   Enlarged prostate 11/01/2019   Vitamin D deficiency 11/01/2019   Unilateral primary osteoarthritis, left knee 10/21/2019   Unilateral primary osteoarthritis, right knee 10/21/2019   Bilateral primary osteoarthritis of knee 09/17/2019    Current Outpatient Medications on File Prior to Visit  Medication Sig Dispense Refill   amoxicillin (AMOXIL) 500 MG tablet Take 4 tablets by mouth as needed.     Bempedoic Acid (NEXLETOL) 180 MG TABS Take 180 mg by mouth daily. 90 tablet 3   CEQUA 0.09 % SOLN Apply 1 drop to eye 2 (two) times daily.     Cholecalciferol 125 MCG (5000 UT) capsule Take 5,000 Units by mouth daily.      CIMETIDINE 200 PO Take by mouth as needed. Pt isn't sure of the dosage     naproxen sodium (ALEVE) 220 MG tablet Take 220 mg by mouth as needed.     predniSONE (DELTASONE) 20 MG tablet Take 2 tabs PO daily x 2 days, then 1 tab PO daily x 3 days, then 1/2 tab PO daily 30 tablet 0   vitamin B-12 (CYANOCOBALAMIN) 50 MCG tablet Take 50 mcg by mouth daily.     No current facility-administered medications on file prior to visit.    Allergies  Allergen Reactions   Crestor [Rosuvastatin]     MYALGIAS   Lipitor [Atorvastatin]     MYLAGIAS    Objective: Physical Exam  General: Well developed, nourished, no acute distress, awake, alert and oriented x 3  Vascular: Dorsalis pedis artery 2/4 bilateral, Posterior tibial artery 2/4 bilateral, skin temperature warm to warm proximal to distal bilateral lower extremities, no varicosities, pedal hair present  bilateral.  Neurological: Gross sensation present via light touch bilateral.   Dermatological: Skin is warm, dry, and supple bilateral, Nails 1-10 are short with proximal clearance, no callus/corns/hyperkeratotic tissue present bilateral. No signs of infection bilateral.  Musculoskeletal: No symptomatic boney deformities noted bilateral. Muscular strength within normal limits without painon range of motion. No pain with calf compression bilateral.  Assessment and Plan:  Problem List Items Addressed This Visit   None Visit Diagnoses     Nail fungus    -  Primary   Toe pain, bilateral           -Examined patient.  -Discussed continued care  -Encouraged good hygiene habits to prevent recurrence of fungus -Patient to return if fails to continue to improve.  Landis Martins, DPM

## 2022-01-02 NOTE — Progress Notes (Signed)
Office Visit Note  Patient: Tristan Hamilton             Date of Birth: November 01, 1951           MRN: [id number]             PCP: [name], MD Referring: [name], MD Visit Date: 01/03/2022  Subjective:  New Patient (Initial Visit) (Muscle aches and pains after statin use, prednisone helps, finger cramps)   History of Present Illness: Tristan Hamilton is a 71 y.o. male here for joint and muscle pains. He started on crestor in September with almost immediate onset of muscle aches in multiple areas including shoulders, hips, hamstrings, and knees bilaterally. This was stopped and tried lipitor with return of same symptoms. Since stopping these he has continued having chronic pain in these areas worst affected now in the shoulders. He hurts about the same throughout the day. He does not noticed any visible swelling or rashes. He also denies new weakness or numbness anywhere. He has cramping in his hands and sometimes in lateral lower legs but this predates the current problems. He was treated with prednisone tapering doses with very good improvement of symptoms, which remained down to doses of 10 mg/day but return in 2-3 days after stopping.  Lab evaluation was negative for ANA with normal ESR, CRP, CK, and uric acid.  Activities of Daily Living:  Patient reports morning stiffness for 0  none .   Patient Reports nocturnal pain.  Difficulty dressing/grooming: Reports Difficulty climbing stairs: Denies Difficulty getting out of chair: Reports Difficulty using hands for taps, buttons, cutlery, and/or writing: Reports  Review of Systems  Constitutional:  Negative for fatigue.  HENT:  Negative for mouth dryness.   Eyes:  Positive for dryness.  Respiratory:  Negative for shortness of breath.   Cardiovascular:  Negative for swelling in legs/feet.  Gastrointestinal:  Negative for constipation.  Endocrine: Negative for excessive thirst.  Genitourinary:  Negative for difficulty  urinating.  Musculoskeletal:  Positive for joint pain, joint pain and muscle tenderness.  Skin:  Negative for rash.  Allergic/Immunologic: Negative for susceptible to infections.  Neurological:  Negative for numbness.  Hematological:  Negative for bruising/bleeding tendency.  Psychiatric/Behavioral:  Negative for sleep disturbance.    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 ago   Enlarged prostate    GERD (gastroesophageal reflux disease)    Headache    h/o of ocular migranes   Heart murmur    History of kidney stones    Osteoarthritis of both knees    Prostate cancer [facility]) 2013   Physician in [place], had another bx. and was negative    Family History  Problem Relation Age of Onset   Diverticulitis Mother    Penile cancer Father    Stroke Sister 29  covid, then massive stroke   Prostate cancer Maternal Grandfather    Colon cancer Neg Hx    Colon polyps Neg Hx    Esophageal cancer Neg Hx    Rectal cancer Neg Hx    Stomach cancer Neg Hx    Past Surgical History:  Procedure Laterality Date   CYSTOSCOPY WITH INSERTION OF UROLIFT     FINGER SURGERY Right    middle finger- got stitches   right knee scope  1975   skiing accident     stitches in head   TESTICLE REMOVAL Left    undescended   TONSILLECTOMY     TOTAL KNEE  ARTHROPLASTY Bilateral 03/24/2020   TOTAL KNEE ARTHROPLASTY Bilateral 03/24/2020   Procedure: TOTAL KNEE BILATERAL;  Surgeon: [name], MD;  Location: [place];  Service: Orthopedics;  Laterality: Bilateral;   VASECTOMY     Social History   Social History Narrative   Not on file   Immunization History  Administered Date(s) Administered   Fluad Quad(high Dose 65+) 02/05/2020, 10/19/2020   PFIZER(Purple Top)SARS-COV-2 Vaccination 08/21/2020, 09/11/2020   Pneumococcal Polysaccharide-23 02/05/2020   Zoster, Live 10/01/2014     Objective: Vital Signs: BP 137/67 (BP Location: Right Arm, Patient Position: Sitting, Cuff Size: Normal)    Pulse 72    Resp 15    Ht '5\' 10"'  (1.778 m)    Wt 199 lb (90.3 kg)    BMI 28.55 kg/m    Physical Exam Eyes:     Conjunctiva/sclera: Conjunctivae normal.  Cardiovascular:     Rate and Rhythm: Normal rate and regular rhythm.  Pulmonary:     Effort: Pulmonary effort is normal.     Breath sounds: Normal breath sounds.  Musculoskeletal:     Right lower leg: No edema.     Left lower leg: No edema.  Skin:    General: Skin is warm and dry.     Findings: No rash.  Neurological:     General: No focal deficit present.     Mental Status: He is alert.     Motor: No weakness.     Deep Tendon Reflexes: Reflexes normal.  Psychiatric:        Mood and Affect: Mood normal.     Musculoskeletal Exam:  Neck full ROM no tenderness Shoulders full ROM, pain with abduction overhead and with reaching behind back, no palpable swelling or warmth or focal tenderness Elbows full ROM no tenderness or swelling Wrists full ROM no tenderness or swelling Fingers full ROM no tenderness or swelling, palmar fascia contractures overlying right 3rd and left 4th flexor tendons Knees full ROM no tenderness or swelling Ankles full ROM no tenderness or swelling   Investigation: No additional findings.  Imaging: No results found.  Recent Labs: Lab Results  Component  Value Date   WBC 7.0 10/27/2021   HGB 15.9 10/27/2021   PLT 230.0 10/27/2021   NA 143 12/06/2021   K 4.2 12/06/2021   CL 101 12/06/2021   CO2 29 12/06/2021   GLUCOSE 133 (H) 12/06/2021   BUN 13 12/06/2021   CREATININE 0.97 12/06/2021   BILITOT 0.8 10/27/2021   ALKPHOS 85 10/27/2021   AST 24 10/27/2021   ALT 29 10/27/2021   PROT 6.9 10/27/2021   ALBUMIN 4.2 10/27/2021   CALCIUM 10.1 12/06/2021   GFRAA >60 03/26/2020    Speciality Comments: No specialty comments available.  Procedures:  No procedures performed Allergies: Crestor [rosuvastatin] and Lipitor [atorvastatin]   Assessment / Plan:  Visit Diagnoses: Myalgia Statin myopathy - Plan: TSH, CK, Aldolase, 3-Hydroxy-3-Methylglutaryl-Coenzyme A Reductase (HMGCR) AB (IgG), Sedimentation rate, predniSONE (DELTASONE) 5 MG tablet  Joint and muscular pain suspected as a statin myopathy the symptoms are now lasting about 3 months after discontinuation of oral statin medication.  CK was previously normal we will recheck also with aldolase and repeat sedimentation rate.  Its not typical with a normal CK but will check HMGCR antibody test given chronicity of pain after medications stopped  Checking TSH screening for alternate myalgia causes due to the prolonged duration.  For now recommend restarting prednisone with 1 week of tapering then continue 5 mg daily and we can follow-up after a month on low-dose. Consider NCS/EMG or additional joint imaging if not tolerating steroid taper and no other positive findings.  Hand cramps  Bilateral hand and finger cramping there is no strength or range of motion deficit on exam and palmar are fascia contractures do not appear significant.  He also has some distal leg muscle cramping but does not seem to be limiting function.  No evidence of electrolyte problem on fairly recent labs.  Orders: Orders Placed This Encounter  Procedures   TSH   CK   Aldolase   3-Hydroxy-3-Methylglutaryl-Coenzyme A  Reductase (HMGCR) AB (IgG)   Sedimentation rate   Meds ordered this encounter  Medications   predniSONE (DELTASONE) 5 MG tablet    Sig: Take 4 tablets (20 mg total) by mouth daily with breakfast for 2 days, THEN 3 tablets (15 mg total) daily with breakfast for 2 days, THEN 2 tablets (10 mg total) daily with breakfast for 2 days, THEN 1 tablet (5 mg total) daily with breakfast.    Dispense:  60 tablet    Refill:  0     Follow-Up Instructions: Return in about 6 weeks (around 02/14/2022) for New pt Myalgias ?Statin prednisone taper f/u 6wks.   Collier Salina, MD  Note - This record has been created using [employer].  Chart creation errors have been sought, but may not always  have been located. Such creation errors do not reflect on  the standard of medical care.

## 2022-01-03 ENCOUNTER — Ambulatory Visit (INDEPENDENT_AMBULATORY_CARE_PROVIDER_SITE_OTHER): Payer: Medicare Other | Admitting: Internal Medicine

## 2022-01-03 ENCOUNTER — Encounter: Payer: Self-pay | Admitting: Internal Medicine

## 2022-01-03 ENCOUNTER — Other Ambulatory Visit: Payer: Self-pay

## 2022-01-03 VITALS — BP 137/67 | HR 72 | Resp 15 | Ht 70.0 in | Wt 199.0 lb

## 2022-01-03 DIAGNOSIS — R252 Cramp and spasm: Secondary | ICD-10-CM | POA: Diagnosis not present

## 2022-01-03 DIAGNOSIS — G720 Drug-induced myopathy: Secondary | ICD-10-CM | POA: Diagnosis not present

## 2022-01-03 DIAGNOSIS — M791 Myalgia, unspecified site: Secondary | ICD-10-CM | POA: Diagnosis not present

## 2022-01-03 DIAGNOSIS — T466X5A Adverse effect of antihyperlipidemic and antiarteriosclerotic drugs, initial encounter: Secondary | ICD-10-CM

## 2022-01-03 DIAGNOSIS — R700 Elevated erythrocyte sedimentation rate: Secondary | ICD-10-CM | POA: Diagnosis not present

## 2022-01-03 MED ORDER — PREDNISONE 5 MG PO TABS: ORAL_TABLET | ORAL | 0 refills | Status: DC

## 2022-01-03 NOTE — Patient Instructions (Addendum)
I am checking several lab tests associated with statin induced muscle inflammation. If these are abnormal they can sometimes indicate a more chronic response to previous drug exposure. If normal I would expect this problem to be more self-limited, and need to look for other causes if not improving.  In the meantime low dose prednisone should treat the current symptoms for now I would like to have this under control for a few weeks before trying to come off this again.

## 2022-01-09 LAB — CK: Total CK: 36 U/L — ABNORMAL LOW (ref 44–196)

## 2022-01-09 LAB — ALDOLASE: Aldolase: 5.1 U/L (ref <=8.1)

## 2022-01-09 LAB — HMGCR AB (IGG): HMGCR AB (IGG): <2 CU (ref <20)

## 2022-01-09 LAB — SEDIMENTATION RATE: Sed Rate: 2 mm/h (ref 0–20)

## 2022-01-09 LAB — TSH: TSH: 1.12 mIU/L (ref 0.40–4.50)

## 2022-01-10 NOTE — Progress Notes (Signed)
Lab results look fine no evidence of chronic inflammatory antibodies from statin medication. We can see how symptoms do over the next month for whether we need more investigation.

## 2022-01-28 ENCOUNTER — Ambulatory Visit: Payer: Medicare Other | Admitting: Internal Medicine

## 2022-02-13 NOTE — Progress Notes (Signed)
Office Visit Note  Patient: Tristan Hamilton             Date of Birth: [birth year]           MRN: [id number]             PCP: [name], MD Referring: [name], MD Visit Date: 02/14/2022   Subjective:  Pain of the Right Wrist, Pain of the Left Wrist, Pain of the Right Shoulder, and Pain of the Left Shoulder   History of Present Illness: Tristan Hamilton is a 71 y.o. male here for follow up for myalgias and hand cramps concerning for possible statin myopathy. CK, aldolase, sed rate, and HMGCR were unremarkable.  He had an improvement with the prednisone taper but started having symptoms again at 5 mg dose and he stopped taking the prednisone completely about 5 days ago and noticed worsening in bilateral shoulder and wrist pain. Symptoms are worse during use with more pain while weight bearing. He felt some improvement with aleve 220 mg as needed.  Previous HPI 01/03/22 Tristan Hamilton is a 71 y.o. male here for joint and muscle pains. He started on crestor in September with almost immediate onset of muscle aches in multiple areas including shoulders, hips, hamstrings, and knees bilaterally. This was stopped and tried lipitor with return of same symptoms. Since stopping these he has continued having chronic pain in these areas worst affected now in the shoulders. He hurts about the same throughout the day. He does not noticed any visible swelling or rashes. He also denies new weakness or numbness anywhere. He has cramping in his hands and sometimes in lateral lower legs but this predates the current problems. He was treated with prednisone tapering doses with very good improvement of symptoms, which remained down to doses of 10 mg/day but return in 2-3 days after stopping.  Lab evaluation was negative for ANA with normal ESR, CRP, CK, and uric acid.   Review of Systems  Constitutional:  Negative for fatigue.  HENT:  Negative for mouth dryness.   Eyes:  Positive for dryness.   Respiratory:  Negative for shortness of breath.   Cardiovascular:  Negative for swelling in legs/feet.  Gastrointestinal:  Negative for constipation.  Endocrine: Negative for increased urination.  Genitourinary:  Negative for difficulty urinating.  Musculoskeletal:  Positive for joint pain, joint pain, morning stiffness and muscle tenderness.  Skin:  Negative for rash.  Allergic/Immunologic: Negative for susceptible to infections.  Neurological:  Positive for weakness.  Hematological:  Negative for bruising/bleeding tendency.  Psychiatric/Behavioral:  Negative for sleep disturbance.    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 ago   Enlarged prostate    GERD (gastroesophageal reflux disease)    Headache    h/o of ocular migranes   Heart murmur    History of kidney stones  Osteoarthritis of both knees    Prostate cancer [facility]) 2013   Physician in [place], had another bx. and was negative    Family History  Problem Relation Age of Onset   Diverticulitis Mother    Penile cancer Father    Stroke Sister 50       covid, then massive stroke   Prostate cancer Maternal Grandfather    Colon cancer Neg Hx    Colon polyps Neg Hx    Esophageal cancer Neg Hx    Rectal cancer Neg Hx    Stomach cancer Neg Hx    Past Surgical History:  Procedure Laterality Date   CYSTOSCOPY WITH  INSERTION OF UROLIFT     FINGER SURGERY Right    middle finger- got stitches   right knee scope  1975   skiing accident     stitches in head   TESTICLE REMOVAL Left    undescended   TONSILLECTOMY     TOTAL KNEE ARTHROPLASTY Bilateral 03/24/2020   TOTAL KNEE ARTHROPLASTY Bilateral 03/24/2020   Procedure: TOTAL KNEE BILATERAL;  Surgeon: [name], MD;  Location: [place];  Service: Orthopedics;  Laterality: Bilateral;   VASECTOMY     Social History   Social History Narrative   Not on file   Immunization History  Administered Date(s) Administered   Fluad Quad(high Dose 65+) 02/05/2020, 10/19/2020   PFIZER(Purple Top)SARS-COV-2 Vaccination 08/21/2020, 09/11/2020   Pneumococcal Polysaccharide-23 02/05/2020   Zoster, Live 10/01/2014     Objective: Vital Signs: BP (!) 157/73 (BP Location: Left Arm, Patient Position: Sitting, Cuff Size: Normal)    Pulse (!) 46    Resp 16    Ht '5\' 10"'  (1.778 m)    Wt 202 lb (91.6 kg)    BMI 28.98 kg/m    Physical Exam Cardiovascular:     Rate and Rhythm: Regular rhythm. Bradycardia present.  Pulmonary:     Effort: Pulmonary effort is normal.     Breath sounds: Normal breath sounds.  Skin:    General: Skin is warm and dry.  Neurological:     General: No focal deficit present.     Mental Status: He is alert.  Psychiatric:        Mood and Affect: Mood normal.     Musculoskeletal Exam:  Neck full ROM no tenderness Shoulders full ROM, no point tenderness or swelling present, pain with resisted abduction and with cross arm raise test, radiation partially to elbow Elbows full ROM no tenderness or swelling Wrists full ROM, no tenderness to palpation, some pain with full flexion, small nodule present on ulnar side of right wrist Fingers full ROM no tenderness or swelling Knees full ROM no tenderness or swelling   Investigation: No additional findings.  Imaging: XR Wrist 2 Views Left  Result Date: 02/15/2022 X-ray left wrist 2 views  Radiocarpal joint space appears normal.  There is mild degenerative appearing changes at the first [facility] joint and radial ulnar joint spaces.  There is an opacity.  Calcification at the flexor side of the joint appears to be adjacent to radial head.  No erosions or focal bone demineralization seen. Impression Mild osteoarthritis changes, calcification on volar side of wrist  XR Wrist 2 Views Right  Result Date: 02/15/2022 X-ray right wrist 2 views Radiocarpal joint space appears normal.  There is a calcification at the radial ulnar space.  No significant abnormalities in carpal bones.  No erosions or demineralization seen no acute bony abnormalities. Impression Normal appearing wrist x-ray  Recent Labs: Lab Results  Component Value Date   WBC 7.0 10/27/2021   HGB 15.9 10/27/2021   PLT 230.0 10/27/2021   NA 143 12/06/2021   K 4.2 12/06/2021   CL 101 12/06/2021   CO2 29 12/06/2021   GLUCOSE 133 (H) 12/06/2021   BUN 13 12/06/2021   CREATININE 0.97 12/06/2021   BILITOT 0.8 10/27/2021   ALKPHOS 85 10/27/2021   AST 24 10/27/2021   ALT 29 10/27/2021   PROT 6.9 10/27/2021   ALBUMIN 4.2 10/27/2021   CALCIUM 10.1 12/06/2021   GFRAA >60 03/26/2020    Speciality Comments: No specialty comments available.  Procedures:  No procedures performed Allergies: Crestor [rosuvastatin] and Lipitor [atorvastatin]   Assessment / Plan:     Visit Diagnoses: Bilateral wrist pain - Plan: XR Wrist 2 Views Right, XR Wrist 2 Views Left, Rheumatoid factor, Sedimentation rate, Cyclic citrul peptide antibody, IgG, meloxicam (MOBIC) 15 MG tablet  Wrist pain in both sides no particular synovitis on exam today.  Checking bilateral wrist x-rays shows only minimal osteoarthritis appearing changes.  Suspect right wrist nodule may be ganglion cyst. Distribution of symptoms now more suggestive for joint inflammation than myalgias.  Checking rheumatoid factor and CCP antibodies.  Also checking sed rate again today now off  any corticosteroids.  I recommend he try NSAID treatment with meloxicam 15 mg daily see if this is effective to avoid more prolonged steroid treatment.  Chronic pain of both shoulders  Shoulder pain provoked with resisted abduction and with cross arm raise test seems consistent with rotator cuff pathology or shoulder bursitis.  Does have some AC joint osteoarthritis seen incidentally on previous chest x-ray reviewed but pain does not localize and no point tenderness here.  Statin myopathy  Symptoms now less typical for muscle pain with localization and shoulder and wrist joints he does not have any weakness tremors or spasticity complaints.  With normal CK normal antibodies and muscular symptoms improved do not see reason to pursue the previously discussed NCS/EMG.  Orders: Orders Placed This Encounter  Procedures   XR Wrist 2 Views Right   XR Wrist 2 Views Left   Rheumatoid factor   Sedimentation rate   Cyclic citrul peptide antibody, IgG   Meds ordered this encounter  Medications   meloxicam (MOBIC) 15 MG tablet    Sig: Take 1 tablet (15 mg total) by mouth daily as needed for pain.    Dispense:  30 tablet    Refill:  2     Follow-Up Instructions: Return in about 3 months (around 05/14/2022) for ?RA trial NSAIDs f/u 15mo.   CCollier Salina MD  Note - This record has been created using [employer]  Chart creation errors have been sought, but may not always  have been located. Such creation errors do not reflect on  the standard of medical care.

## 2022-02-14 ENCOUNTER — Encounter: Payer: Self-pay | Admitting: Internal Medicine

## 2022-02-14 ENCOUNTER — Other Ambulatory Visit: Payer: Self-pay

## 2022-02-14 ENCOUNTER — Ambulatory Visit: Payer: Self-pay

## 2022-02-14 ENCOUNTER — Ambulatory Visit (INDEPENDENT_AMBULATORY_CARE_PROVIDER_SITE_OTHER): Payer: Medicare Other | Admitting: Internal Medicine

## 2022-02-14 VITALS — BP 157/73 | HR 46 | Resp 16 | Ht 70.0 in | Wt 202.0 lb

## 2022-02-14 DIAGNOSIS — G8929 Other chronic pain: Secondary | ICD-10-CM

## 2022-02-14 DIAGNOSIS — M25532 Pain in left wrist: Secondary | ICD-10-CM | POA: Diagnosis not present

## 2022-02-14 DIAGNOSIS — M25512 Pain in left shoulder: Secondary | ICD-10-CM

## 2022-02-14 DIAGNOSIS — M25531 Pain in right wrist: Secondary | ICD-10-CM | POA: Insufficient documentation

## 2022-02-14 DIAGNOSIS — M25511 Pain in right shoulder: Secondary | ICD-10-CM | POA: Insufficient documentation

## 2022-02-14 DIAGNOSIS — G720 Drug-induced myopathy: Secondary | ICD-10-CM | POA: Diagnosis not present

## 2022-02-14 DIAGNOSIS — T466X5A Adverse effect of antihyperlipidemic and antiarteriosclerotic drugs, initial encounter: Secondary | ICD-10-CM

## 2022-02-14 MED ORDER — MELOXICAM 15 MG PO TABS: 15.0000 mg | ORAL_TABLET | Freq: Every day | ORAL | 2 refills | Status: DC | PRN

## 2022-02-15 LAB — LIPID PANEL
Chol/HDL Ratio: 3 ratio (ref 0.0–5.0)
Cholesterol, Total: 137 mg/dL (ref 100–199)
HDL: 45 mg/dL (ref >39)
LDL Chol Calc (NIH): 69 mg/dL (ref 0–99)
Triglycerides: 132 mg/dL (ref 0–149)
VLDL Cholesterol Cal: 23 mg/dL (ref 5–40)

## 2022-02-15 LAB — HEPATIC FUNCTION PANEL
ALT: 23 IU/L (ref 0–44)
AST: 22 IU/L (ref 0–40)
Albumin: 4.7 g/dL (ref 3.8–4.8)
Alkaline Phosphatase: 69 IU/L (ref 44–121)
Bilirubin Total: 0.9 mg/dL (ref 0.0–1.2)
Bilirubin, Direct: 0.27 mg/dL (ref 0.00–0.40)
Total Protein: 6.7 g/dL (ref 6.0–8.5)

## 2022-02-15 LAB — RHEUMATOID FACTOR: Rheumatoid fact SerPl-aCnc: <14 IU/mL (ref <14)

## 2022-02-15 LAB — SEDIMENTATION RATE: Sed Rate: 2 mm/h (ref 0–20)

## 2022-02-15 LAB — CYCLIC CITRUL PEPTIDE ANTIBODY, IGG: Cyclic Citrullin Peptide Ab: <16 UNITS

## 2022-02-16 ENCOUNTER — Other Ambulatory Visit: Payer: Self-pay

## 2022-02-16 DIAGNOSIS — M791 Myalgia, unspecified site: Secondary | ICD-10-CM

## 2022-02-16 DIAGNOSIS — E785 Hyperlipidemia, unspecified: Secondary | ICD-10-CM

## 2022-02-16 DIAGNOSIS — T466X5A Adverse effect of antihyperlipidemic and antiarteriosclerotic drugs, initial encounter: Secondary | ICD-10-CM

## 2022-02-16 DIAGNOSIS — G720 Drug-induced myopathy: Secondary | ICD-10-CM

## 2022-02-16 NOTE — Addendum Note (Signed)
Addended by: Allean Found on: 02/16/2022 09:52 AM ? ? Modules accepted: Orders ? ?

## 2022-02-16 NOTE — Telephone Encounter (Signed)
Pt called. Pt aware ?

## 2022-02-17 NOTE — Progress Notes (Signed)
I spoke with Tristan Hamilton his lab results are unremarkable no evidence for RA also normal sed rate so PMR very unlikely. I recommended he just continue with the meloxicam for now and see how well this helps. He has no history of wrist injury. We have follow up scheduled in 3 months or he can contact us if there is a flare up or major change.

## 2022-02-24 ENCOUNTER — Telehealth: Payer: Self-pay | Admitting: Internal Medicine

## 2022-02-24 DIAGNOSIS — M25511 Pain in right shoulder: Secondary | ICD-10-CM

## 2022-02-24 DIAGNOSIS — R700 Elevated erythrocyte sedimentation rate: Secondary | ICD-10-CM

## 2022-02-24 DIAGNOSIS — M25531 Pain in right wrist: Secondary | ICD-10-CM

## 2022-02-24 DIAGNOSIS — G8929 Other chronic pain: Secondary | ICD-10-CM

## 2022-02-24 NOTE — Telephone Encounter (Signed)
Patient called the office stating he was prescribed Meloxicam by Dr. Benjamine Mola at his last appointment. Patient states his symptoms have gotten worse and wants to know what his options are.  ?

## 2022-02-25 MED ORDER — PREDNISONE 5 MG PO TABS: 5.0000 mg | ORAL_TABLET | Freq: Every day | ORAL | 0 refills | Status: DC

## 2022-02-25 NOTE — Telephone Encounter (Signed)
I spoke with Tristan Hamilton he has increased symptoms with shoulders and wrist pains and now starting to have neck and glutes stiffness getting worse. He has not noticed any real benefit with the meloxicam after 2 weeks. I recommended we can go back to low dose prednisone 5 mg daily, he can take '10mg'$  for initial 1-2 weeks or until symptoms improve. If not able to come off this by next follow up in May would discuss steroid sparing DMARDs.

## 2022-03-04 ENCOUNTER — Encounter: Payer: Self-pay | Admitting: Cardiology

## 2022-03-06 NOTE — Telephone Encounter (Signed)
Would hold Nexletol and recommend follow-up in pharmacy clinic to consider PCSK9 inhibitor ?

## 2022-03-23 ENCOUNTER — Encounter: Payer: Self-pay | Admitting: Family Medicine

## 2022-03-23 ENCOUNTER — Ambulatory Visit (INDEPENDENT_AMBULATORY_CARE_PROVIDER_SITE_OTHER): Payer: Medicare Other | Admitting: Family Medicine

## 2022-03-23 VITALS — BP 112/70 | HR 55 | Temp 97.9°F | Ht 70.0 in | Wt 201.2 lb

## 2022-03-23 DIAGNOSIS — M25511 Pain in right shoulder: Secondary | ICD-10-CM | POA: Diagnosis not present

## 2022-03-23 DIAGNOSIS — M25512 Pain in left shoulder: Secondary | ICD-10-CM

## 2022-03-23 DIAGNOSIS — M2550 Pain in unspecified joint: Secondary | ICD-10-CM

## 2022-03-23 NOTE — Progress Notes (Signed)
?Ira Dougher ?DOB: 09-Mar-1951 ?Encounter date: 03/23/2022 ? ?This is a 71 y.o. male who presents with ?Chief Complaint  ?Patient presents with  ? Shoulder Pain  ?  Patient complains of bilateral shoulder pain for the past few months since he started taking Crestor, no known injury  ? ? ?History of present illness: ?Ever since going on crestor he had problems. Had issues with meds even after this. Still with shoulder pain, wrist, hand pain. Supposed to see pharmacy again next week. No pain at all prior to being on crestor. The prednisone does help ease the pain. Was doing just '5mg'$ /day,but that doesn't do much. '10mg'$  will help.  ? ?Left wrist, thumb joint, all of hand, wrist on right side. Can't even put pressure on wrist if down on floor to stand up.  ? ?Started with crestor, then tried lipitor, then nexletol. Just no improvement in pain over time. Tendons in wrist, hands seemed to get worse on nexletol.   ? ?Anterior shoulder hurts, muscle hurts. Hands are more painful in the morning. Can get arms above head.  ? ?Initially when I gave him prednisone, his pain was better by that evening.  ? ?He did take meloxicam for a month but didn't note much improvement with this.  ? ? ?Allergies  ?Allergen Reactions  ? Crestor [Rosuvastatin]   ?  MYALGIAS  ? Lipitor [Atorvastatin]   ?  MYLAGIAS  ? ?Current Meds  ?Medication Sig  ? amoxicillin (AMOXIL) 500 MG tablet Take 4 tablets by mouth as needed.  ? CEQUA 0.09 % SOLN Apply 1 drop to eye 2 (two) times daily.  ? Cholecalciferol (VITAMIN D3) 10 MCG (400 UNIT) CAPS   ? Cholecalciferol 125 MCG (5000 UT) capsule Take 5,000 Units by mouth daily.   ? CIMETIDINE 200 PO Take by mouth as needed. Pt isn't sure of the dosage  ? predniSONE (DELTASONE) 5 MG tablet Take 1 tablet (5 mg total) by mouth daily with breakfast.  ? vitamin B-12 (CYANOCOBALAMIN) 50 MCG tablet Take 50 mcg by mouth daily.  ? ? ?Review of Systems  ?Constitutional:  Negative for chills, fatigue and fever.  ?Respiratory:   Negative for cough, chest tightness, shortness of breath and wheezing.   ?Cardiovascular:  Negative for chest pain, palpitations and leg swelling.  ?Musculoskeletal:  Positive for arthralgias.  ? ?Objective: ? ?BP 112/70 (BP Location: Left Arm, Patient Position: Sitting, Cuff Size: Normal)   Pulse (!) 55   Temp 97.9 ?F (36.6 ?C) (Oral)   Ht '5\' 10"'$  (1.778 m)   Wt 201 lb 3.2 oz (91.3 kg)   SpO2 95%   BMI 28.87 kg/m?   Weight: 201 lb 3.2 oz (91.3 kg)  ? ?BP Readings from Last 3 Encounters:  ?03/23/22 112/70  ?02/14/22 (!) 157/73  ?01/03/22 137/67  ? ?Wt Readings from Last 3 Encounters:  ?03/23/22 201 lb 3.2 oz (91.3 kg)  ?02/14/22 202 lb (91.6 kg)  ?01/03/22 199 lb (90.3 kg)  ? ? ?Physical Exam ?Constitutional:   ?   General: He is not in acute distress. ?   Appearance: He is well-developed.  ?Cardiovascular:  ?   Rate and Rhythm: Normal rate and regular rhythm.  ?   Heart sounds: Normal heart sounds. No murmur heard. ?  No friction rub.  ?Pulmonary:  ?   Effort: Pulmonary effort is normal. No respiratory distress.  ?   Breath sounds: Normal breath sounds. No wheezing or rales.  ?Musculoskeletal:  ?   Right lower leg: No  edema.  ?   Left lower leg: No edema.  ?   Comments: Full rom both shoulders, but pain with abduction esp over 100 degrees.negative hawkins, full strength rotator cuff. No bicep/tricep weakness, tenderness.   ?Neurological:  ?   Mental Status: He is alert and oriented to person, place, and time.  ?Psychiatric:     ?   Behavior: Behavior normal.  ? ? ? ?Assessment/Plan ? ? ?1. Bilateral shoulder pain, unspecified chronicity ?Appreciate sports med opinion and help with pain/inflammation control. This has been very limiting for patient.  ?- Ambulatory referral to Sports Medicine ? ?2. Arthralgia, unspecified joint ?See above. Prednisone is keeping pain at bay; no relief with meloxicam. Following with rheumatology.  ? ? ?Return for pending lab or imaging results. ? ? ?Micheline Rough, MD  ? ? ?

## 2022-03-29 ENCOUNTER — Ambulatory Visit (INDEPENDENT_AMBULATORY_CARE_PROVIDER_SITE_OTHER): Payer: Medicare Other | Admitting: Pharmacist Clinician (PhC)/ Clinical Pharmacy Specialist

## 2022-03-29 DIAGNOSIS — E7849 Other hyperlipidemia: Secondary | ICD-10-CM

## 2022-03-29 DIAGNOSIS — E785 Hyperlipidemia, unspecified: Secondary | ICD-10-CM

## 2022-03-29 NOTE — Assessment & Plan Note (Signed)
Patient with elevated calcium score and intolerance to multiple statin drugs.  Tried Nexletol and developed severe arthralgias in both wrists/hands to the point where he could not pick anything up.  Stopped medication and was given prednisone.  However when he stops prednisone the pain comes back.  He is scheduled to see sports medicine next month.  Not interested in any further cholesterol lowering medications at this time.  We will repeat labs in late summer and he will see Dr. Gardiner Rhyme after that ?

## 2022-03-29 NOTE — Patient Instructions (Signed)
Repeat cholesterol labs in late July or early August, at least a week before you see Dr. Gardiner Rhyme. ?

## 2022-03-29 NOTE — Progress Notes (Signed)
03/29/2022 ?Tristan Hamilton ?[birth year] ?[id number] ? ? ?HPI:  Tristan Hamilton is a 71 y.o. male patient of Dr Gardiner Rhyme, who presents today for a lipid clinic evaluation.  He had a recent coronary calcium scoring showed a total score of 237 (59th percentile), with all of the scoring coming from the left anterior descending artery.   He was started on rosuvastatin, but this led to myalgias in both hips, down to knees and hamstrings, as well as shoulders.  Rosuvastatin was stopped and he was then given atorvastatin.  Unfortunately the same reaction occurred within days of starting that.   I saw him in November and we started Nexletol 180 mg daily.  Uric acid labs done after a few weeks were WNL.   ? ?Developed pain in both hands/wrists; took for 3 weeks before it started; some pain in shoulder joints, but not as bad as hands.  He was given prednisone by his rheumatologist for this and took for the past few weeks.  Stopped about 3 days ago and the pain has returned.  Has appointment for sports medicine scheduled for next month.   ? ?Prescription Insurance:  ?[employer] ?BIN [id number] ?PCN [employer] ?GRP BROOMEDDRX ? ?ID [id number] ? ?Current Medications: none ? ?Cholesterol Goals: LDL < 70 ?  ?Intolerant/previously tried: atorvastatin, rosuvastatin, bempedoic acid ? ?Family history: sister with stroke at 44 ? ?Labs: 11/22: TC 180, TG 88, HDL 49, LDL 115  ? 2/23:  TC 137, TG 132, HDL 45, LDL 69  - stopped Nexletol before Christmas ? ? ?Current Outpatient Medications  ?Medication Sig Dispense Refill  ? amoxicillin (AMOXIL) 500 MG tablet Take 4 tablets by mouth as needed.    ? CEQUA 0.09 % SOLN Apply 1 drop to eye 2 (two) times daily.    ? Cholecalciferol (VITAMIN D3) 10 MCG (400 UNIT) CAPS     ? Cholecalciferol 125 MCG (5000 UT) capsule Take 5,000 Units by mouth daily.     ? CIMETIDINE 200 PO Take by mouth as needed. Pt isn't sure of the dosage    ? predniSONE (DELTASONE) 5 MG tablet Take 1 tablet (5 mg total) by mouth  daily with breakfast. 90 tablet 0  ? vitamin B-12 (CYANOCOBALAMIN) 50 MCG tablet Take 50 mcg by mouth daily.    ? ?No current facility-administered medications for this visit.  ? ? ?Allergies  ?Allergen Reactions  ? Crestor [Rosuvastatin]   ?  MYALGIAS  ? Lipitor [Atorvastatin]   ?  MYLAGIAS  ? ? ?Past Medical History:  ?Diagnosis Date  ? DVT (deep venous thrombosis) ([place])   ? left leg calf, 4 yrs. ago  ? Enlarged prostate   ? GERD (gastroesophageal reflux disease)   ? Headache   ? h/o of ocular migranes  ? Heart murmur   ? History of kidney stones   ? Osteoarthritis of both knees   ? Prostate cancer [facility]) 2013  ? Physician in [place], had another bx. and was negative  ? ? ?There were no vitals taken for this visit. ? ? ?Hyperlipidemia ?Patient with elevated calcium score and intolerance to multiple statin drugs.  Tried Nexletol and developed severe arthralgias in both wrists/hands to the point where he could not pick anything up.  Stopped medication and was given prednisone.  However when he stops prednisone the pain comes back.  He is scheduled to see sports medicine next month.  Not interested in any further cholesterol lowering medications at this time.  We will repeat labs in late summer  and he will see Dr. Gardiner Rhyme after that ? ? ? ?Tommy Medal PharmD CPP [facility] ?[address] ?[address] ?[place], [place] [address] ?[phone] ? ? ? ? ?

## 2022-04-17 NOTE — Progress Notes (Signed)
? ?Office Visit Note ? ?Patient: Tristan Hamilton             ?Date of Birth: [birth year]           ?MRN: [id number]             ?PCP: [name], MD ?Referring: [name], MD ?Visit Date: 04/18/2022 ? ? ?Subjective:  ?Follow-up (Not doing well) ? ? ?History of Present Illness: Tristan Hamilton is a 71 y.o. male here for follow up here for muscle pain and bilateral hand pains negative lab workup for myositis or for PMR or RA. Recommended meloxicam for hand pains possibly OA related but did not help much recommended trying resuming prednisone 5 mg daily. He is doing well on the medication but tried stopping it since 3 days ago to see how much effect and did notice quickly increased pains in his hands and wrists and to lesser extent in shoulders. ? ?Previous HPI ?02/14/22 ?Tristan Hamilton is a 71 y.o. male here for follow up for myalgias and hand cramps concerning for possible statin myopathy. CK, aldolase, sed rate, and HMGCR were unremarkable.  He had an improvement with the prednisone taper but started having symptoms again at 5 mg dose and he stopped taking the prednisone completely about 5 days ago and noticed worsening in bilateral shoulder and wrist pain. Symptoms are worse during use with more pain while weight bearing. He felt some improvement with aleve 220 mg as needed. ?  ?Previous HPI ?01/03/22 ?Tristan Hamilton is a 71 y.o. male here for joint and muscle pains. He started on crestor in September with almost immediate onset of muscle aches in multiple areas including shoulders, hips, hamstrings, and knees bilaterally. This was stopped and tried lipitor with return of same symptoms. Since stopping these he has continued having chronic pain in these areas worst affected now in the shoulders. He hurts about the same throughout the day. He does not noticed any visible swelling or rashes. He also denies new weakness or numbness anywhere. He has cramping in his hands and sometimes in lateral lower legs  but this predates the current problems. He was treated with prednisone tapering doses with very good improvement of symptoms, which remained down to doses of 10 mg/day but return in 2-3 days after stopping.  Lab evaluation was negative for ANA with normal ESR, CRP, CK, and uric acid. ? ? ?Review of Systems  ?Constitutional:  Negative for fever.  ?HENT:  Negative for mouth dryness.   ?Eyes:  Positive for dryness.  ?Respiratory:  Negative for shortness of breath.   ?Cardiovascular:  Negative for swelling in legs/feet.  ?Gastrointestinal:  Negative for constipation.  ?Endocrine: Negative for excessive thirst.  ?Genitourinary:  Negative for difficulty urinating.  ?Musculoskeletal:  Positive for muscle tenderness.  ?Skin:  Negative for rash.  ?Allergic/Immunologic: Negative for susceptible to infections.  ?Neurological:  Negative for numbness.  ?Hematological:  Negative for bruising/bleeding tendency.  ?Psychiatric/Behavioral:  Negative for sleep disturbance.   ? ?PMFS History:  ?Patient Active Problem List  ? Diagnosis Date Noted  ? Bilateral wrist pain 02/14/2022  ? Bilateral shoulder pain 02/14/2022  ? Myalgia 01/03/2022  ? Hand cramps 01/03/2022  ? 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 Hyperlipidemia 11/10/2021  ? OA (osteoarthritis) of knee 03/24/2020  ? Status post total bilateral knee replacement 03/24/2020  ? Acquired renal cyst of right kidney 01/21/2020  ? Prostate cancer ([place]) 11/01/2019  ? Enlarged prostate 11/01/2019  ? Vitamin D deficiency 11/01/2019  ?  Bilateral primary osteoarthritis of knee 09/17/2019  ?  ?Past Medical History:  ?Diagnosis Date  ? DVT (deep venous thrombosis) ([place])   ? left leg calf, 4 yrs. ago  ? Enlarged prostate   ? GERD (gastroesophageal reflux disease)   ? Headache   ? h/o of ocular migranes  ? Heart murmur   ? History of kidney stones   ? Osteoarthritis of both knees   ? Prostate cancer [facility]) 2013  ? Physician in [place], had another bx. and was negative  ?  ?Family History  ?Problem Relation  Age of Onset  ? Diverticulitis Mother   ? Penile cancer Father   ? Stroke Sister 39  ?     covid, then massive stroke  ? Prostate cancer Maternal Grandfather   ? Colon cancer Neg Hx   ? Colon polyps Neg Hx   ? Esophageal cancer Neg Hx   ? Rectal cancer Neg Hx   ? Stomach cancer Neg Hx   ? ?Past Surgical History:  ?Procedure Laterality Date  ? CYSTOSCOPY WITH INSERTION OF UROLIFT    ? FINGER SURGERY Right   ? middle finger- got stitches  ? right knee scope  1975  ? skiing accident    ? stitches in head  ? 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 TONSILLECTOMY    ? TOTAL KNEE ARTHROPLASTY Bilateral 03/24/2020  ? TOTAL KNEE ARTHROPLASTY Bilateral 03/24/2020  ? Procedure: TOTAL KNEE BILATERAL;  Surgeon: [name], MD;  Location: [place];  Service: Orthopedics;  Laterality: Bilateral;  ? VASECTOMY    ? ?Social History  ? ?Social History Narrative  ? Not on file  ? ?Immunization History  ?Administered Date(s) Administered  ? Fluad Quad(high Dose 65+) 02/05/2020, 10/19/2020  ? PFIZER(Purple Top)SARS-COV-2 Vaccination 08/21/2020, 09/11/2020  ? Pneumococcal Polysaccharide-23 02/05/2020  ? Tdap 01/23/2022  ? Zoster, Live 10/01/2014  ?  ? ?Objective: ?Vital Signs: BP 132/78 (BP Location: Left Arm, Patient Position: Sitting, Cuff Size: Normal)   Pulse (!) 49   Resp 15   Ht '5\' 10"'  (1.778 m)   Wt 201 lb (91.2 kg)   BMI 28.84 kg/m?   ? ?Physical Exam ?Cardiovascular:  ?   Rate and Rhythm: Normal rate and regular rhythm.  ?Pulmonary:  ?   Effort: Pulmonary effort is normal.  ?   Breath sounds: Normal breath sounds.  ?Musculoskeletal:  ?   Right lower leg: No edema.  ?   Left lower leg: No edema.  ?Skin: ?   General: Skin is warm and dry.  ?   Findings: No rash.  ?Neurological:  ?   Mental Status: He is alert.  ?Psychiatric:     ?   Mood and Affect: Mood normal.  ?  ? ?Musculoskeletal Exam:  ?Shoulders full ROM, right shoulder mild pain with active abduction overhead no pain on passive ROM, no swelling ?Elbows full  ROM no tenderness or swelling ?Wrists full ROM no tenderness or swelling ?Fingers full ROM no tenderness or swelling ?Hip normal internal and external rotation without pain, no tenderness to lateral hip palpation ?Knees full ROM no tenderness or swelling ?Ankles full ROM no tenderness or swelling ? ?Investigation: ?No additional findings. ? ?Imaging: ?No results found. ? ?Recent Labs: ?Lab Results  ?Component Value Date  ? WBC 7.0 10/27/2021  ? HGB 15.9 10/27/2021  ? PLT 230.0 10/27/2021  ? NA 143 12/06/2021  ? K 4.2 12/06/2021  ? CL 101 12/06/2021  ? CO2 29 12/06/2021  ?  GLUCOSE 133 (H) 12/06/2021  ? BUN 13 12/06/2021  ? CREATININE 0.97 12/06/2021  ? BILITOT 0.9 02/15/2022  ? ALKPHOS 69 02/15/2022  ? AST 22 02/15/2022  ? ALT 23 02/15/2022  ? PROT 6.7 02/15/2022  ? ALBUMIN 4.7 02/15/2022  ? CALCIUM 10.1 12/06/2021  ? GFRAA >60 03/26/2020  ? ? ?Speciality Comments: No specialty comments available. ? ?Procedures:  ?No procedures performed ?Allergies: Crestor [rosuvastatin] and Lipitor [atorvastatin]  ? ?Assessment / Plan:     ?Visit Diagnoses: Bilateral wrist pain ?Chronic pain of both shoulders - Plan: predniSONE (DELTASONE) 5 MG tablet ? ?Localization of symptoms and pain worsened with active vs passive ROM suspect rotator cuff arthropathy more than OA or glenohumeral joint inflammation. Wrist pain without focal tenderness or swelling on exam today. No specific diagnosis at this time to recommend a more targeted medical therapy. For shoulder PT referral may be a beneficial option. ?He has upcoming sports medicine appointment. I recommend any additional imaging or testing we would also be able to share results. For now his reported symptom improvements seem to outweigh side effects of low dose prednisone so can continue 5 mg daily for now, encouraged rechallenging stopping or reducing this dose. ? ?Statin myopathy ? ?Discussed no serology evidence for immune mediated myositis problem and normal muscle enzymes, sed  rate also normalized. If this is still lingering pain related to statin exposure no particular treatment besides avoiding medication. ? ?Orders: ?No orders of the defined types were placed in this encounter. ? ?

## 2022-04-18 ENCOUNTER — Ambulatory Visit (INDEPENDENT_AMBULATORY_CARE_PROVIDER_SITE_OTHER): Payer: Medicare Other | Admitting: Internal Medicine

## 2022-04-18 ENCOUNTER — Encounter: Payer: Self-pay | Admitting: Internal Medicine

## 2022-04-18 VITALS — BP 132/78 | HR 49 | Resp 15 | Ht 70.0 in | Wt 201.0 lb

## 2022-04-18 DIAGNOSIS — M25511 Pain in right shoulder: Secondary | ICD-10-CM

## 2022-04-18 DIAGNOSIS — R700 Elevated erythrocyte sedimentation rate: Secondary | ICD-10-CM

## 2022-04-18 DIAGNOSIS — G8929 Other chronic pain: Secondary | ICD-10-CM

## 2022-04-18 DIAGNOSIS — M25531 Pain in right wrist: Secondary | ICD-10-CM | POA: Diagnosis not present

## 2022-04-18 DIAGNOSIS — G720 Drug-induced myopathy: Secondary | ICD-10-CM

## 2022-04-18 DIAGNOSIS — M25532 Pain in left wrist: Secondary | ICD-10-CM

## 2022-04-18 DIAGNOSIS — M25512 Pain in left shoulder: Secondary | ICD-10-CM

## 2022-04-18 DIAGNOSIS — T466X5A Adverse effect of antihyperlipidemic and antiarteriosclerotic drugs, initial encounter: Secondary | ICD-10-CM

## 2022-04-18 MED ORDER — PREDNISONE 5 MG PO TABS: 5.0000 mg | ORAL_TABLET | Freq: Every day | ORAL | 1 refills | Status: DC

## 2022-04-20 ENCOUNTER — Encounter: Payer: Self-pay | Admitting: Family Medicine

## 2022-04-21 ENCOUNTER — Other Ambulatory Visit: Payer: Self-pay | Admitting: Family Medicine

## 2022-04-21 MED ORDER — CIMETIDINE 200 MG PO TABS: 200.0000 mg | ORAL_TABLET | Freq: Two times a day (BID) | ORAL | 2 refills | Status: AC | PRN

## 2022-05-04 NOTE — Progress Notes (Signed)
[facility] [address] [place] Phone: [phone] Subjective:   IVilma Meckel, am serving as a scribe for Dr. Hulan Saas. This visit occurred during the SARS-CoV-2 public health emergency.  Safety protocols were in place, including screening questions prior to the visit, additional usage of staff PPE, and extensive cleaning of exam room while observing appropriate contact time as indicated for disinfecting solutions.   I'm seeing this patient by the request  of:  Koberlein, Steele Berg, MD  CC: Pain all over especially wrist hands and shoulders  QVZ:[id number]  Tristan Hamilton is a 71 y.o. male coming in with complaint of bilateral shoulder and wrist pain. Started crestor last year and changed to non statin. Same side effects. Prednisone to help with pain. Pain with lifting hands above head. Right shoulder worse than left.  Pressure on wrist causes pain. Constant pain that's dull and ache.  Patient states that now since he was on the steroids he feels better at rest but unfortunately continues to have discomfort when he does any type of pushing motions.  Patient CK levels and all other labs that were reviewed by me does show improvement.       Past Medical History:  Diagnosis Date   DVT (deep venous thrombosis) ([facility])    left leg calf, 4 yrs. ago   Enlarged prostate    GERD (gastroesophageal reflux disease)    Headache    h/o of ocular migranes   Heart murmur    History of kidney stones    Osteoarthritis of both knees    Prostate cancer [facility]) 2013   Physician in [place], had another bx. and was negative   Past Surgical History:  Procedure Laterality Date   CYSTOSCOPY WITH INSERTION OF UROLIFT     FINGER SURGERY Right    middle finger- got stitches   right knee scope  1975   skiing accident     stitches in head   TESTICLE REMOVAL Left    undescended   TONSILLECTOMY     TOTAL KNEE ARTHROPLASTY Bilateral 03/24/2020   TOTAL KNEE  ARTHROPLASTY Bilateral 03/24/2020   Procedure: TOTAL KNEE BILATERAL;  Surgeon: [name], MD;  Location: [place];  Service: Orthopedics;  Laterality: Bilateral;   VASECTOMY     Social History   Socioeconomic History   Marital status: Married    Spouse name: Not on file   Number of children: Not on file   Years of education: Not on file   Highest education level: Some college, no degree  Occupational History   Not on file  Tobacco Use   Smoking status: Never   Smokeless tobacco: Never  Vaping Use   Vaping Use: Never used  Substance and Sexual Activity   Alcohol use: Never   Drug use: Never   Sexual activity: Not on file  Other Topics Concern   Not on file  Social History Narrative   Not on file   Social Determinants of Health   Financial Resource Strain: Low Risk    Difficulty of Paying Living Expenses: Not hard at all  Food Insecurity: No Food Insecurity   Worried About Charity fundraiser in the Last Year: Never true   [place] in the Last Year: Never true  Transportation Needs: No Transportation Needs   Lack of Transportation (Medical): No   Lack of Transportation (Non-Medical): No  Physical Activity: Sufficiently Active   Days of Exercise per Week:  2 days   Minutes of Exercise per Session: 90 min  Stress: No Stress Concern Present   Feeling of Stress : Not at all  Social Connections: Socially Integrated   Frequency of Communication with Friends and Family: More than three times a week   Frequency of Social Gatherings with Friends and Family: More than three times a week   Attends Religious Services: More than 4 times per year   Active Member of [employer] or Organizations: Yes   Attends Music therapist: More than 4 times per year   Marital Status: Married   Allergies  Allergen Reactions   Crestor [Rosuvastatin]     MYALGIAS   Lipitor [Atorvastatin]     MYLAGIAS   Family History  Problem Relation Age of Onset   Diverticulitis  Mother    Penile cancer Father    Stroke Sister 85       covid, then massive stroke   Prostate cancer Maternal Grandfather    Colon cancer Neg Hx    Colon polyps Neg Hx    Esophageal cancer Neg Hx    Rectal cancer Neg Hx    Stomach cancer Neg Hx     Current Outpatient Medications (Endocrine & Metabolic):    predniSONE (DELTASONE) 5 MG tablet, Take 1 tablet (5 mg total) by mouth daily with breakfast.     Current Outpatient Medications (Hematological):    vitamin B-12 (CYANOCOBALAMIN) 50 MCG tablet, Take 50 mcg by mouth daily.  Current Outpatient Medications (Other):    amoxicillin (AMOXIL) 500 MG tablet, Take 4 tablets by mouth as needed.   CEQUA 0.09 % SOLN, Apply 1 drop to eye 2 (two) times daily. (Patient not taking: Reported on 04/18/2022)   Cholecalciferol 125 MCG (5000 UT) capsule, Take 5,000 Units by mouth daily.    cimetidine (CIMETIDINE 200) 200 MG tablet, Take 1 tablet (200 mg total) by mouth 2 (two) times daily as needed. Pt isn't sure of the dosage   Hyprom-Naphaz-Polysorb-Zn Sulf (CLEAR EYES COMPLETE OP), Apply to eye.   Reviewed prior external information including notes and imaging from  primary care provider As well as notes that were available from care everywhere and other healthcare systems.  Past medical history, social, surgical and family history all reviewed in electronic medical record.  No pertanent information unless stated regarding to the chief complaint.   Review of Systems:  No headache, visual changes, nausea, vomiting, diarrhea, constipation, dizziness, abdominal pain, skin rash, fevers, chills, night sweats, weight loss, swollen lymph nodes,  joint swelling, chest pain, shortness of breath, mood changes. POSITIVE muscle aches, body aches  Objective  Blood pressure (!) 156/94, pulse (!) 50, height '5\' 10"'$  (1.778 m), weight 202 lb (91.6 kg), SpO2 95 %.   General: No apparent distress alert and oriented x3 mood and affect normal, dressed  appropriately.  HEENT: Pupils equal, extraocular movements intact  Respiratory: Patient's speak in full sentences and does not appear short of breath  Cardiovascular: No lower extremity edema, non tender, no erythema  Gait normal with good balance and coordination.  Bilateral knee replacements noted with no significant effusion MSK: Low back exam does have some loss of lordosis.  Some tenderness to palpation in the paraspinal musculature minorly.  Patient does have some limited extension of the right wrist compared to the left.  No significant swelling noted of the MCPs noted.  Good grip strength noted.    Impression and Recommendations:     The above documentation has been reviewed  and is accurate and complete Lyndal Pulley, DO

## 2022-05-05 ENCOUNTER — Encounter: Payer: Self-pay | Admitting: Family Medicine

## 2022-05-05 ENCOUNTER — Ambulatory Visit (INDEPENDENT_AMBULATORY_CARE_PROVIDER_SITE_OTHER): Payer: Medicare Other | Admitting: Family Medicine

## 2022-05-05 DIAGNOSIS — G720 Drug-induced myopathy: Secondary | ICD-10-CM | POA: Diagnosis not present

## 2022-05-05 DIAGNOSIS — T466X5A Adverse effect of antihyperlipidemic and antiarteriosclerotic drugs, initial encounter: Secondary | ICD-10-CM

## 2022-05-05 NOTE — Assessment & Plan Note (Signed)
Concerned the patient did have more of a statin myopathy but patient has been off the medication for quite some time.  Discussed CoQ 10 as well as the possibility of DHEA due to the extended need for the steroids that patient had.  We will see how patient responds to this.  I do believe that patient is doing better overall than where he likely wants.  We discussed increasing activity.  Follow-up again 6 weeks and if still doing better will likely be able to be discharged

## 2022-05-05 NOTE — Patient Instructions (Signed)
Good to see you! CoQ10 '200mg'$  daily DHEA 50 mg daily for 4 weeks Take the prednisone to [facility], try not to take See you again in 4-6 weeks if not completely resolved

## 2022-05-09 ENCOUNTER — Ambulatory Visit: Payer: Medicare Other | Admitting: Internal Medicine

## 2022-05-23 ENCOUNTER — Encounter: Payer: Self-pay | Admitting: Family Medicine

## 2022-05-30 ENCOUNTER — Encounter: Payer: Self-pay | Admitting: Family Medicine

## 2022-05-30 ENCOUNTER — Ambulatory Visit (INDEPENDENT_AMBULATORY_CARE_PROVIDER_SITE_OTHER): Payer: Medicare Other | Admitting: Family Medicine

## 2022-05-30 VITALS — BP 118/60 | HR 47 | Temp 98.0°F | Ht 70.0 in | Wt 202.8 lb

## 2022-05-30 DIAGNOSIS — S60221A Contusion of right hand, initial encounter: Secondary | ICD-10-CM | POA: Diagnosis not present

## 2022-05-30 DIAGNOSIS — M67431 Ganglion, right wrist: Secondary | ICD-10-CM | POA: Diagnosis not present

## 2022-05-30 NOTE — Progress Notes (Signed)
   Subjective:    Patient ID: Tristan Hamilton, male    DOB: [birth year], 71 y.o.   MRN: [id number]  HPI Here to check his right hand after an apparent injury that occurred at home 2 weeks ago. He was on a ladder and lost his balance. He was able to reach out and catch himself from falling by grabbing a support beam under his garage roof. Since then however he has had extra sensitivity over the lateral right hand, and he has mild pain when he grips something. There was no swelling or bruising. He notes that he has had a lump on the right wrist for several years but it had never bothered him, however it is now tender. He has applied Aspercream to the area with no relief.    Review of Systems  Constitutional: Negative.   Respiratory: Negative.    Cardiovascular: Negative.   Musculoskeletal:  Positive for arthralgias.       Objective:   Physical Exam Constitutional:      Appearance: Normal appearance.  Cardiovascular:     Rate and Rhythm: Normal rate and regular rhythm.     Pulses: Normal pulses.     Heart sounds: Normal heart sounds.  Pulmonary:     Effort: Pulmonary effort is normal.     Breath sounds: Normal breath sounds.  Musculoskeletal:     Comments: There is a small ganglion cyst on the lateral right wrist which is quite tender. No swelling or ecchymosis. The right 5th metacarpal is intact and is not tender at all.   Neurological:     Mental Status: He is alert.           Assessment & Plan:  He has had a contusion to the right lateral wrist and hand which caused the ganglion cyst to swell and put pressure on the ulnar nerve coming into the hand. He can apply ice. This should resolve on its own, but he will see Dr. Charlann Boxer on 06-09-22, and he can ask him to aspirate the cyst.  Alysia Penna, MD

## 2022-06-08 NOTE — Progress Notes (Signed)
[place] Phone: [phone] Subjective:   Fontaine No, am serving as a scribe for Dr. Hulan Saas.   I'm seeing this patient by the request  of:  [name], MD (Inactive)  CC: bilateral shoulder pain   [id number]  05/05/2022 Concerned the patient did have more of a statin myopathy but patient has been off the medication for quite some time.  Discussed CoQ 10 as well as the possibility of DHEA due to the extended need for the steroids that patient had.  We will see how patient responds to this.  I do believe that patient is doing better overall than where he likely wants.  We discussed increasing activity.  Follow-up again 6 weeks and if still doing better will likely be able to be discharged  Update 06/09/2022 Knute Mazzuca is a 71 y.o. male coming in with complaint of B shoulder pain. Patient sent MyChart message since last visit stating that symptoms became worse and he did have to use prednisone. Also c/o ganglion cyst on R hand and would like this drained. Patient states today that that his shoulders are the same as last visit. Patient was on prednisone but stopped earlier this week. Pain in L shoulder decreased but R shoulder continues to bother him more.   States that he has had cyst on R 5th metacarpal for over 1 year. Recently he hit that area and has been having some nerve pain since.       Past Medical History:  Diagnosis Date   DVT (deep venous thrombosis) (HCC)    left leg calf, 4 yrs. ago   Enlarged prostate    GERD (gastroesophageal reflux disease)    Headache    h/o of ocular migranes   Heart murmur    History of kidney stones    Osteoarthritis of both knees    Prostate cancer [facility]) 2013   Physician in [place], had another bx. and was negative   Past Surgical History:  Procedure Laterality Date   CYSTOSCOPY WITH INSERTION OF UROLIFT     FINGER SURGERY Right    middle  finger- got stitches   right knee scope  1975   skiing accident     stitches in head   TESTICLE REMOVAL Left    undescended   TONSILLECTOMY     TOTAL KNEE ARTHROPLASTY Bilateral 03/24/2020   TOTAL KNEE ARTHROPLASTY Bilateral 03/24/2020   Procedure: TOTAL KNEE BILATERAL;  Surgeon: [name], MD;  Location: [facility];  Service: Orthopedics;  Laterality: Bilateral;   VASECTOMY     Social History   Socioeconomic History   Marital status: Married    Spouse name: Not on file   Number of children: Not on file   Years of education: Not on file   Highest education level: Some college, no degree  Occupational History   Not on file  Tobacco Use   Smoking status: Never   Smokeless tobacco: Never  Vaping Use   Vaping Use: Never used  Substance and Sexual Activity   Alcohol use: Never   Drug use: Never   Sexual activity: Not on file  Other Topics Concern   Not on file  Social History Narrative   Not on file   Social Determinants of Health   Financial Resource Strain: Low Risk  (03/22/2022)   Overall Financial Resource Strain (CARDIA)    Difficulty of Paying Living Expenses: Not hard at all  Food Insecurity: No Food Insecurity (03/22/2022)   Hunger Vital Sign    Worried About Running Out of Food in the Last Year: Never true    Ran Out of Food in the Last Year: Never true  Transportation Needs: No Transportation Needs (03/22/2022)   PRAPARE - Hydrologist (Medical): No    Lack of Transportation (Non-Medical): No  Physical Activity: Sufficiently Active (03/22/2022)   Exercise Vital Sign    Days of Exercise per Week: 2 days    Minutes of Exercise per Session: 90 min  Stress: No Stress Concern Present (03/22/2022)   [place]    Feeling of Stress : Not at all  Social Connections: Socially Integrated (03/22/2022)   Social Connection and Isolation Panel [NHANES]    Frequency of  Communication with Friends and Family: More than three times a week    Frequency of Social Gatherings with Friends and Family: More than three times a week    Attends Religious Services: More than 4 times per year    Active Member of [employer] or Organizations: Yes    Attends Music therapist: More than 4 times per year    Marital Status: Married   Allergies  Allergen Reactions   Crestor [Rosuvastatin]     MYALGIAS   Lipitor [Atorvastatin]     MYLAGIAS   Family History  Problem Relation Age of Onset   Diverticulitis Mother    Penile cancer Father    Stroke Sister 54       covid, then massive stroke   Prostate cancer Maternal Grandfather    Colon cancer Neg Hx    Colon polyps Neg Hx    Esophageal cancer Neg Hx    Rectal cancer Neg Hx    Stomach cancer Neg Hx     Current Outpatient Medications (Endocrine & Metabolic):    predniSONE (DELTASONE) 5 MG tablet, Take 1 tablet (5 mg total) by mouth daily with breakfast.     Current Outpatient Medications (Hematological):    vitamin B-12 (CYANOCOBALAMIN) 50 MCG tablet, Take 50 mcg by mouth daily.  Current Outpatient Medications (Other):    amoxicillin (AMOXIL) 500 MG tablet, Take 4 tablets by mouth as needed.   Cholecalciferol 125 MCG (5000 UT) capsule, Take 5,000 Units by mouth daily.    cimetidine (CIMETIDINE 200) 200 MG tablet, Take 1 tablet (200 mg total) by mouth 2 (two) times daily as needed. Pt isn't sure of the dosage   doxycycline (VIBRA-TABS) 100 MG tablet, Take 1 tablet (100 mg total) by mouth 2 (two) times daily.   gabapentin (NEURONTIN) 100 MG capsule, Take 1 capsule (100 mg total) by mouth at bedtime.   Hyprom-Naphaz-Polysorb-Zn Sulf (CLEAR EYES COMPLETE OP), Apply to eye.   Reviewed prior external information including notes and imaging from  primary care provider As well as notes that were available from care everywhere and other healthcare systems.  Past medical history, social, surgical and family  history all reviewed in electronic medical record.  No pertanent information unless stated regarding to the chief complaint.   Review of Systems:  No headache, visual changes, nausea, vomiting, diarrhea, constipation, dizziness, abdominal pain, skin rash, fevers, chills, night sweats, weight loss, swollen lymph nodes, body aches, joint swelling, chest pain, shortness of breath, mood changes. POSITIVE muscle aches  Objective  Blood pressure 136/76, pulse 60, height '5\' 10"'$  (1.778 m), weight 205 lb (93 kg), SpO2 97 %.  General: No apparent distress alert and oriented x3 mood and affect normal, dressed appropriately.  HEENT: Pupils equal, extraocular movements intact  Respiratory: Patient's speak in full sentences and does not appear short of breath  Cardiovascular: No lower extremity edema, non tender, no erythema  Patient back exam does have some limited extension noted.  Tightness noted on the left side of the neck.  Negative Spurling's. 5-5 strength of the upper extremity when it comes to the hands.  Patient does have mild impingement noted.  Right wrist exam does have a cyst noted on the dorsal ulnar aspect.  Freely movable and tender.  No skin findings noted.  Back exam does have a abscess today.  Does not appear to be infected at the moment though.  Severely tender to palpation  After verbal consent patient was prepped with alcohol swab and with a 10 blade and attempted to cut around the cyst.  Unfortunately having difficulty in seeing the cyst goes more deep than anticipated.  Minimal blood loss.  Steri-Strips placed.  Post incision instructions given.  Procedure: Real-time Ultrasound Guided Injection of right dorsal wrist cyst Device: GE Logiq Q7 Ultrasound guided injection is preferred based studies that show increased duration, increased effect, greater accuracy, decreased procedural pain, increased response rate, and decreased cost with ultrasound guided versus blind injection.   Verbal informed consent obtained.  Time-out conducted.  Noted no overlying erythema, induration, or other signs of local infection.  Skin prepped in a sterile fashion.  Local anesthesia: Topical Ethyl chloride.  With sterile technique and under real time ultrasound guidance: With a 18-gauge 1 inch needle patient was injected with 0.5 cc of 0.5% Marcaine and then aspirated gel-like Material.  Then injected 0.5 cc of Kenalog 40 mg Completed without difficulty  Pain immediately resolved suggesting accurate placement of the medication.  Advised to call if fevers/chills, erythema, induration, drainage, or persistent bleeding.  Impression: Technically successful ultrasound guided injection.    Impression and Recommendations:     The above documentation has been reviewed and is accurate and complete Lyndal Pulley, DO

## 2022-06-09 ENCOUNTER — Ambulatory Visit (INDEPENDENT_AMBULATORY_CARE_PROVIDER_SITE_OTHER): Payer: Medicare Other | Admitting: Family Medicine

## 2022-06-09 ENCOUNTER — Encounter: Payer: Self-pay | Admitting: Family Medicine

## 2022-06-09 ENCOUNTER — Ambulatory Visit (INDEPENDENT_AMBULATORY_CARE_PROVIDER_SITE_OTHER): Payer: Medicare Other

## 2022-06-09 ENCOUNTER — Ambulatory Visit: Payer: Self-pay

## 2022-06-09 VITALS — BP 136/76 | HR 60 | Ht 70.0 in | Wt 205.0 lb

## 2022-06-09 DIAGNOSIS — M25512 Pain in left shoulder: Secondary | ICD-10-CM

## 2022-06-09 DIAGNOSIS — M542 Cervicalgia: Secondary | ICD-10-CM

## 2022-06-09 DIAGNOSIS — G8929 Other chronic pain: Secondary | ICD-10-CM

## 2022-06-09 DIAGNOSIS — M6740 Ganglion, unspecified site: Secondary | ICD-10-CM

## 2022-06-09 DIAGNOSIS — L02212 Cutaneous abscess of back [any part, except buttock]: Secondary | ICD-10-CM

## 2022-06-09 DIAGNOSIS — M25511 Pain in right shoulder: Secondary | ICD-10-CM

## 2022-06-09 MED ORDER — GABAPENTIN 100 MG PO CAPS: 100.0000 mg | ORAL_CAPSULE | Freq: Every day | ORAL | 0 refills | Status: DC

## 2022-06-09 MED ORDER — DOXYCYCLINE HYCLATE 100 MG PO TABS
100.0000 mg | ORAL_TABLET | Freq: Two times a day (BID) | ORAL | 0 refills | Status: DC
Start: 2022-06-09 — End: 2022-07-18

## 2022-06-09 NOTE — Patient Instructions (Addendum)
Prescription filled Meloxicam for 10 days then as needed Redness chills seek medical attention Referral for back put in See you again in 8 weeks

## 2022-06-09 NOTE — Assessment & Plan Note (Signed)
Aspirated on the ulnar aspect of the dorsal portion of the wrist.  Patient is extremely well to it.  Hopefully patient will get good response.  Discussed any redness to take antibiotic.  Follow-up again in 6 to 8 weeks

## 2022-06-09 NOTE — Assessment & Plan Note (Signed)
Patient continues to have the difficulty when he is on the prednisone.  At this moment concern for more of a cervical radiculopathy.  X-rays of the neck ordered.  Gabapentin given as well.

## 2022-06-13 ENCOUNTER — Encounter: Payer: Self-pay | Admitting: Cardiology

## 2022-06-13 DIAGNOSIS — I6523 Occlusion and stenosis of bilateral carotid arteries: Secondary | ICD-10-CM

## 2022-06-14 NOTE — Telephone Encounter (Signed)
Lets check carotid duplex

## 2022-06-22 ENCOUNTER — Ambulatory Visit (HOSPITAL_COMMUNITY)
Admission: RE | Admit: 2022-06-22 | Discharge: 2022-06-22 | Disposition: A | Discharge status: Home / Self Care | Payer: Medicare Other | Source: Ambulatory Visit | Attending: Internal Medicine | Admitting: Internal Medicine

## 2022-06-22 DIAGNOSIS — I6523 Occlusion and stenosis of bilateral carotid arteries: Secondary | ICD-10-CM | POA: Insufficient documentation

## 2022-06-28 ENCOUNTER — Other Ambulatory Visit: Payer: Self-pay | Admitting: *Deleted

## 2022-06-28 DIAGNOSIS — I77819 Aortic ectasia, unspecified site: Secondary | ICD-10-CM

## 2022-06-28 DIAGNOSIS — Z01812 Encounter for preprocedural laboratory examination: Secondary | ICD-10-CM

## 2022-07-05 ENCOUNTER — Encounter: Payer: Self-pay | Admitting: Family Medicine

## 2022-07-06 ENCOUNTER — Ambulatory Visit (INDEPENDENT_AMBULATORY_CARE_PROVIDER_SITE_OTHER): Payer: Medicare Other | Admitting: Pharmacist

## 2022-07-06 DIAGNOSIS — T466X5A Adverse effect of antihyperlipidemic and antiarteriosclerotic drugs, initial encounter: Secondary | ICD-10-CM | POA: Diagnosis not present

## 2022-07-06 DIAGNOSIS — I6523 Occlusion and stenosis of bilateral carotid arteries: Secondary | ICD-10-CM | POA: Diagnosis not present

## 2022-07-06 DIAGNOSIS — G720 Drug-induced myopathy: Secondary | ICD-10-CM | POA: Diagnosis not present

## 2022-07-06 DIAGNOSIS — E785 Hyperlipidemia, unspecified: Secondary | ICD-10-CM

## 2022-07-06 NOTE — Progress Notes (Signed)
Patient ID: Tristan Hamilton                 DOB: 1951-07-28                    MRN: [id number]     HPI: Tristan Hamilton is a 71 y.o. male patient referred to lipid clinic by Dr Gardiner Rhyme. PMH is significant for carotid artery stenosis, elevated coronary calcium score, and statin myopathy. This is patient's third visit to lipid clinic.  Patient originally started on Nexletol in 11/22 but discontinued due to myalgia.  Pain was relieved with prednisone but pain would return when he completed prednisone course. Sports medicine doctor recommended starting CoQ10 and DHEA  Restarted Nexletol and LDL decreased from 115 to 69.  Current Medications: N/a  Intolerances:  Atorvastatin Rosuvastatin Nexletol?  Risk Factors:  Elevated coronary calcium  LDL goal: <70  Labs: TC 137, Trigs 132, HDL 45, LDL 69 (02/15/22)  Coronary calcium score of 237. This was 59th percentile for age-,race-, and sex-matched controls.  Past Medical History:  Diagnosis Date   DVT (deep venous thrombosis) (HCC)    left leg calf, 4 yrs. ago   Enlarged prostate    GERD (gastroesophageal reflux disease)    Headache    h/o of ocular migranes   Heart murmur    History of kidney stones    Osteoarthritis of both knees    Prostate cancer [facility]) 2013   Physician in [place], had another bx. and was negative    Current Outpatient Medications on File Prior to Visit  Medication Sig Dispense Refill   amoxicillin (AMOXIL) 500 MG tablet Take 4 tablets by mouth as needed.     Cholecalciferol 125 MCG (5000 UT) capsule Take 5,000 Units by mouth daily.      cimetidine (CIMETIDINE 200) 200 MG tablet Take 1 tablet (200 mg total) by mouth 2 (two) times daily as needed. Pt isn't sure of the dosage 60 tablet 2   doxycycline (VIBRA-TABS) 100 MG tablet Take 1 tablet (100 mg total) by mouth 2 (two) times daily. 20 tablet 0   gabapentin (NEURONTIN) 100 MG capsule Take 1 capsule (100 mg total) by mouth at bedtime. 90 capsule 0    Hyprom-Naphaz-Polysorb-Zn Sulf (CLEAR EYES COMPLETE OP) Apply to eye.     predniSONE (DELTASONE) 5 MG tablet Take 1 tablet (5 mg total) by mouth daily with breakfast. 90 tablet 1   vitamin B-12 (CYANOCOBALAMIN) 50 MCG tablet Take 50 mcg by mouth daily.     No current facility-administered medications on file prior to visit.    Allergies  Allergen Reactions   Crestor [Rosuvastatin]     MYALGIAS   Lipitor [Atorvastatin]     MYLAGIAS    Assessment/Plan:  1. Hyperlipidemia - Patient's last LDL was 69 however this was when he was taking Nexletol. Unclear if Nexletol was cause of his myalgias but he is willing to restart.  Will recheck lipid panel in 2-3 months after restarting.  Retstart Nexletol '180mg'$  daily Recheck lipid panel in 2-3 months  Karren Cobble, PharmD, [place], [place], [place], [place], [place], [address] Phone: [phone], Fax: [phone]

## 2022-07-06 NOTE — Telephone Encounter (Signed)
I reviewed these results

## 2022-07-06 NOTE — Patient Instructions (Addendum)
It was nice meeting you today  We would like your LDL (bad cholesterol) to be less than 70  Please restart your Nexletol '180mg'$  once a day and let us know if you have any adverse effects  If you tolerate it well, we will recheck your cholesterol in about 2-3 months  You can come any time. We prefer you be fasting however.  Please call with any questions  Karren Cobble, PharmD, [place], [place], [place], [place], [place], [address] Phone: [phone], Fax: [phone]

## 2022-07-18 ENCOUNTER — Ambulatory Visit (INDEPENDENT_AMBULATORY_CARE_PROVIDER_SITE_OTHER): Payer: Medicare Other

## 2022-07-18 ENCOUNTER — Encounter: Payer: Self-pay | Admitting: Pharmacist

## 2022-07-18 VITALS — Ht 70.0 in | Wt 200.0 lb

## 2022-07-18 DIAGNOSIS — Z0000 Encounter for general adult medical examination without abnormal findings: Secondary | ICD-10-CM | POA: Diagnosis not present

## 2022-07-18 DIAGNOSIS — E785 Hyperlipidemia, unspecified: Secondary | ICD-10-CM

## 2022-07-18 DIAGNOSIS — I6523 Occlusion and stenosis of bilateral carotid arteries: Secondary | ICD-10-CM

## 2022-07-18 NOTE — Progress Notes (Signed)
I connected with Tristan Hamilton today by telephone and verified that I am speaking with the correct person using two identifiers. Location patient: home Location provider: work Persons participating in the virtual visit: Tristan Hamilton, Tristan Hamilton.   I discussed the limitations, risks, security and privacy concerns of performing an evaluation and management service by telephone and the availability of in person appointments. I also discussed with the patient that there may be a patient responsible charge related to this service. The patient expressed understanding and verbally consented to this telephonic visit.    Interactive audio and video telecommunications were attempted between this provider and patient, however failed, due to patient having technical difficulties OR patient did not have access to video capability.  We continued and completed visit with audio only.     Vital signs may be patient reported or missing.  Subjective:   Tristan Hamilton is a 71 y.o. male who presents for Medicare Annual/Subsequent preventive examination.  Review of Systems     Cardiac Risk Factors include: advanced age (>26mn, >>54women);dyslipidemia;male gender     Objective:    Today's Vitals   07/18/22 1611 07/18/22 1612  Weight: 200 lb (90.7 kg)   Height: '5\' 10"'$  (1.778 m)   PainSc:  3    Body mass index is 28.7 kg/m.     07/18/2022    4:22 PM 06/25/2021    9:05 AM 03/25/2020    8:19 AM 03/20/2020    1:14 PM  Advanced Directives  Does Patient Have a Medical Advance Directive? Yes Yes Yes Yes  Type of AParamed[place]of [place] will [place]Living will HF[place] LickLiving will HFann[place]Living will  Does patient want to make changes to medical advance directive?   No - Patient declined No - Patient declined  Copy of [place] Chart? Yes - validated most recent copy scanned in chart  (See row information) No - copy requested No - copy requested No - copy requested    Current Medications (verified) Outpatient Encounter Medications as of 07/18/2022  Medication Sig   amoxicillin (AMOXIL) 500 MG tablet Take 4 tablets by mouth as needed.   Cholecalciferol 125 MCG (5000 UT) capsule Take 5,000 Units by mouth daily.    cimetidine (CIMETIDINE 200) 200 MG tablet Take 1 tablet (200 mg total) by mouth 2 (two) times daily as needed. Pt isn't sure of the dosage   Coenzyme Q10 100 MG TABS Take 1 tablet by mouth daily. Takes 200 mg   DHEA 50 MG TABS Take 1 tablet by mouth daily.   Hyprom-Naphaz-Polysorb-Zn Sulf (CLEAR EYES COMPLETE OP) Apply to eye.   vitamin B-12 (CYANOCOBALAMIN) 50 MCG tablet Take 50 mcg by mouth daily.   NEXLETOL 180 MG TABS Take 1 tablet by mouth daily. (Patient not taking: Reported on 07/18/2022)   [DISCONTINUED] doxycycline (VIBRA-TABS) 100 MG tablet Take 1 tablet (100 mg total) by mouth 2 (two) times daily.   [DISCONTINUED] gabapentin (NEURONTIN) 100 MG capsule Take 1 capsule (100 mg total) by mouth at bedtime.   No facility-administered encounter medications on file as of 07/18/2022.    Allergies (verified) Crestor [rosuvastatin] and Lipitor [atorvastatin]   History: Past Medical History:  Diagnosis Date   DVT (deep venous thrombosis) (HCC)    left leg calf, 4 yrs. ago   Enlarged prostate    GERD (gastroesophageal reflux disease)    Headache    h/o of ocular migranes  Heart murmur    History of kidney stones    Osteoarthritis of both knees    Prostate cancer [facility]) 2013   Physician in [place], had another bx. and was negative   Past Surgical History:  Procedure Laterality Date   CYSTOSCOPY WITH INSERTION OF UROLIFT     FINGER SURGERY Right    middle finger- got stitches   right knee scope  1975   skiing accident     stitches in head   TESTICLE REMOVAL Left    undescended   TONSILLECTOMY     TOTAL KNEE ARTHROPLASTY Bilateral 03/24/2020   TOTAL  KNEE ARTHROPLASTY Bilateral 03/24/2020   Procedure: TOTAL KNEE BILATERAL;  Surgeon: [name], MD;  Location: [place];  Service: Orthopedics;  Laterality: Bilateral;   VASECTOMY     Family History  Problem Relation Age of Onset   Diverticulitis Mother    Penile cancer Father    Stroke Sister 18       covid, then massive stroke   Prostate cancer Maternal Grandfather    Colon cancer Neg Hx    Colon polyps Neg Hx    Esophageal cancer Neg Hx    Rectal cancer Neg Hx    Stomach cancer Neg Hx    Social History   Socioeconomic History   Marital status: Married    Spouse name: Not on file   Number of children: Not on file   Years of education: Not on file   Highest education level: Some college, no degree  Occupational History   Not on file  Tobacco Use   Smoking status: Never   Smokeless tobacco: Never  Vaping Use   Vaping Use: Never used  Substance and Sexual Activity   Alcohol use: Never   Drug use: Never   Sexual activity: Not on file  Other Topics Concern   Not on file  Social History Narrative   Not on file   Social Determinants of Health   Financial Resource Strain: Low Risk  (07/18/2022)   Overall Financial Resource Strain (CARDIA)    Difficulty of Paying Living Expenses: Not hard at all  Food Insecurity: No Food Insecurity (07/18/2022)   Hunger Vital Sign    Worried About Running Out of Food in the Last Year: Never true    Ran Out of Food in the Last Year: Never true  Transportation Needs: No Transportation Needs (07/18/2022)   PRAPARE - Hydrologist (Medical): No    Lack of Transportation (Non-Medical): No  Physical Activity: Inactive (07/18/2022)   Exercise Vital Sign    Days of Exercise per Week: 0 days    Minutes of Exercise per Session: 0 min  Stress: No Stress Concern Present (07/18/2022)   [place]    Feeling of Stress : Not at all  Social  Connections: [place] (03/22/2022)   Social Connection and Isolation Panel [NHANES]    Frequency of Communication with Friends and Family: More than three times a week    Frequency of Social Gatherings with Friends and Family: More than three times a week    Attends Religious Services: More than 4 times per year    Active Member of [employer] or Organizations: Yes    Attends Music therapist: More than 4 times per year    Marital Status: Married    Tobacco Counseling Counseling given: Not Answered   Clinical Intake:  Pre-visit preparation completed: Yes  Pain : 0-10 Pain Score: 3  Pain Location:  (hand and shoulders) Pain Orientation: Left, Right Pain Descriptors / Indicators: Aching Pain Onset: More than a month ago Pain Frequency: Constant     Nutritional Status: BMI 25 -29 Overweight Nutritional Risks: None Diabetes: No  How often do you need to have someone help you when you read instructions, pamphlets, or other written materials from your doctor or pharmacy?: 1 - Never What is the last grade level you completed in school?: 46yrcollege  Diabetic?no  Interpreter Needed?: No  Information entered by :: NAllen Hamilton   Activities of Daily Living    07/18/2022    4:25 PM 07/14/2022   11:06 PM  In your present state of health, do you have any difficulty performing the following activities:  Hearing? 0 0  Vision? 0 0  Difficulty concentrating or making decisions? 0 0  Walking or climbing stairs? 0 0  Dressing or bathing? 0 0  Doing errands, shopping? 0 0  Preparing Food and eating ? N N  Using the Toilet? N N  In the past six months, have you accidently leaked urine? N N  Do you have problems with loss of bowel control? N N  Managing your Medications? N N  Managing your Finances? N N  Housekeeping or managing your Housekeeping? N N    Patient Care Team: MFarrel Conners MD as PCP - General (Family Medicine)  Indicate any recent Medical  Services you may have received from other than [facility] providers in the past year (date may be approximate).     Assessment:   This is a routine wellness examination for DKhiem  Hearing/Vision screen Vision Screening - Comments:: Regular eye exams, Dr. BCheri Rous Dietary issues and exercise activities discussed: Current Exercise Habits: The patient does not participate in regular exercise at present   Goals Addressed             This Visit's Progress    Patient Stated       07/18/2022, lose a little weight       Depression Screen    07/18/2022    4:25 PM 05/30/2022    8:43 AM 03/23/2022    2:04 PM 10/27/2021   10:45 AM 06/25/2021    9:06 AM 06/25/2021    9:04 AM 10/19/2020    9:26 AM  PHQ 2/9 Scores  PHQ - 2 Score 0 0 0 0 0 0 0  PHQ- 9 Score  0 0 6       Fall Risk    07/18/2022    4:23 PM 07/14/2022   11:06 PM 03/22/2022   10:12 PM 10/27/2021    9:23 AM 08/13/2021   10:04 AM  Fall Risk   Falls in the past year? 0 0 0 0 0  Number falls in past yr: 0 0  0   Injury with Fall? 0 0     Risk for fall due to : No Fall Risks;Medication side effect      Follow up Falls evaluation completed;Education provided;Falls prevention discussed        FALL RISK PREVENTION PERTAINING TO THE HOME:  Any stairs in or around the home? Yes  If so, are there any without handrails? No  Home free of loose throw rugs in walkways, pet beds, electrical cords, etc? Yes  Adequate lighting in your home to reduce risk of falls? Yes   ASSISTIVE DEVICES UTILIZED TO PREVENT FALLS:  Life alert? No  Use of  a cane, walker or w/c? No  Grab bars in the bathroom? No  Shower chair or bench in shower? No  Elevated toilet seat or a handicapped toilet? Yes   TIMED UP AND GO:  Was the test performed? No .       Cognitive Function:        07/18/2022    4:26 PM  6CIT Screen  What Year? 0 points  What month? 0 points  What time? 0 points  Count back from 20 0 points  Months in reverse 0 points   Repeat phrase 0 points  Total Score 0 points    Immunizations Immunization History  Administered Date(s) Administered   Fluad Quad(high Dose 65+) 02/05/2020, 10/19/2020   PFIZER(Purple Top)SARS-COV-2 Vaccination 08/21/2020, 09/11/2020   Pneumococcal Polysaccharide-23 02/05/2020   Tdap 01/23/2022   Zoster, Live 10/01/2014    TDAP status: Up to date  Flu Vaccine status: Up to date  Pneumococcal vaccine status: Due, Education has been provided regarding the importance of this vaccine. Advised may receive this vaccine at local pharmacy or Health Dept. Aware to provide a copy of the vaccination record if obtained from local pharmacy or Health Dept. Verbalized acceptance and understanding.  Covid-19 vaccine status: Completed vaccines  Qualifies for Shingles Vaccine? Yes   Zostavax completed Yes   Shingrix Completed?: No.    Education has been provided regarding the importance of this vaccine. Patient has been advised to call insurance company to determine out of pocket expense if they have not yet received this vaccine. Advised may also receive vaccine at local pharmacy or Health Dept. Verbalized acceptance and understanding.  Screening Tests Health Maintenance  Topic Date Due   URINE MICROALBUMIN  Never done   Zoster Vaccines- Shingrix (1 of 2) Never done   COVID-19 Vaccine (3 - Pfizer risk series) 10/09/2020   Pneumonia Vaccine 72+ Years old (2 - PCV) 02/04/2021   INFLUENZA VACCINE  07/19/2022   COLONOSCOPY (Pts 45-42yr Insurance coverage will need to be confirmed)  08/12/2031   TETANUS/TDAP  01/24/2032   Hepatitis C Screening  Completed   HPV VACCINES  Aged Out    Health Maintenance  Health Maintenance Due  Topic Date Due   URINE MICROALBUMIN  Never done   Zoster Vaccines- Shingrix (1 of 2) Never done   COVID-19 Vaccine (3 - Pfizer risk series) 10/09/2020   Pneumonia Vaccine 71 Years old (2 - PCV) 02/04/2021    Colorectal cancer screening: Type of screening:  Colonoscopy. Completed 08/11/2021. Repeat every 5 years  Lung Cancer Screening: (Low Dose CT Chest recommended if Age 71-80years, 30 pack-year currently smoking OR have quit w/in 15years.) does not qualify.   Lung Cancer Screening Referral: no  Additional Screening:  Hepatitis C Screening: does qualify; Completed 10/22/2020  Vision Screening: Recommended annual ophthalmology exams for early detection of glaucoma and other disorders of the eye. Is the patient up to date with their annual eye exam?  Yes  Who is the provider or what is the name of the office in which the patient attends annual eye exams? Dr. bCheri RousIf pt is not established with a provider, would they like to be referred to a provider to establish care? No .   Dental Screening: Recommended annual dental exams for proper oral hygiene  Community Resource Referral / Chronic Care Management: CRR required this visit?  No   CCM required this visit?  No      Plan:     I have personally reviewed  and noted the following in the patient's chart:   Medical and social history Use of alcohol, tobacco or illicit drugs  Current medications and supplements including opioid prescriptions. Patient is not currently taking opioid prescriptions. Functional ability and status Nutritional status Physical activity Advanced directives List of other physicians Hospitalizations, surgeries, and ER visits in previous 12 months Vitals Screenings to include cognitive, depression, and falls Referrals and appointments  In addition, I have reviewed and discussed with patient certain preventive protocols, quality metrics, and best practice recommendations. A written personalized care plan for preventive services as well as general preventive health recommendations were provided to patient.     Kellie Simmering, Hamilton   [birth year]   Nurse Notes: none  Due to this being a virtual visit, the after visit summary with patients personalized plan  was offered to patient via mail or my-chart. Patient would like to access on my-chart

## 2022-07-18 NOTE — Patient Instructions (Signed)
Tristan Hamilton , Thank you for taking time to come for your Medicare Wellness Visit. I appreciate your ongoing commitment to your health goals. Please review the following plan we discussed and let me know if I can assist you in the future.   Screening recommendations/referrals: Colonoscopy: completed 08/11/2021, due 08/11/2026 Recommended yearly ophthalmology/optometry visit for glaucoma screening and checkup Recommended yearly dental visit for hygiene and checkup  Vaccinations: Influenza vaccine: due 07/19/2022 Pneumococcal vaccine: due Tdap vaccine: completed 01/23/2022, due 01/24/2032 Shingles vaccine: discussed   Covid-19:  09/11/2020, 08/21/2020  Advanced directives: copy in chart  Conditions/risks identified: none  Next appointment: Follow up in one year for your annual wellness visit.   Preventive Care 71 Years and Older, Male Preventive care refers to lifestyle choices and visits with your health care provider that can promote health and wellness. What does preventive care include? A yearly physical exam. This is also called an annual well check. Dental exams once or twice a year. Routine eye exams. Ask your health care provider how often you should have your eyes checked. Personal lifestyle choices, including: Daily care of your teeth and gums. Regular physical activity. Eating a healthy diet. Avoiding tobacco and drug use. Limiting alcohol use. Practicing safe sex. Taking low doses of aspirin every day. Taking vitamin and mineral supplements as recommended by your health care provider. What happens during an annual well check? The services and screenings done by your health care provider during your annual well check will depend on your age, overall health, lifestyle risk factors, and family history of disease. Counseling  Your health care provider may ask you questions about your: Alcohol use. Tobacco use. Drug use. Emotional well-being. Home and relationship  well-being. Sexual activity. Eating habits. History of falls. Memory and ability to understand (cognition). Work and work Statistician. Screening  You may have the following tests or measurements: Height, weight, and BMI. Blood pressure. Lipid and cholesterol levels. These may be checked every 5 years, or more frequently if you are over 1 years old. Skin check. Lung cancer screening. You may have this screening every year starting at age 32 if you have a 30-pack-year history of smoking and currently smoke or have quit within the past 15 years. Fecal occult blood test (FOBT) of the stool. You may have this test every year starting at age 30. Flexible sigmoidoscopy or colonoscopy. You may have a sigmoidoscopy every 5 years or a colonoscopy every 10 years starting at age 53. Prostate cancer screening. Recommendations will vary depending on your family history and other risks. Hepatitis C blood test. Hepatitis B blood test. Sexually transmitted disease (STD) testing. Diabetes screening. This is done by checking your blood sugar (glucose) after you have not eaten for a while (fasting). You may have this done every 1-3 years. Abdominal aortic aneurysm (AAA) screening. You may need this if you are a current or former smoker. Osteoporosis. You may be screened starting at age 29 if you are at high risk. Talk with your health care provider about your test results, treatment options, and if necessary, the need for more tests. Vaccines  Your health care provider may recommend certain vaccines, such as: Influenza vaccine. This is recommended every year. Tetanus, diphtheria, and acellular pertussis (Tdap, Td) vaccine. You may need a Td booster every 10 years. Zoster vaccine. You may need this after age 35. Pneumococcal 13-valent conjugate (PCV13) vaccine. One dose is recommended after age 26. Pneumococcal polysaccharide (PPSV23) vaccine. One dose is recommended after age 62. Talk  to your health care  provider about which screenings and vaccines you need and how often you need them. This information is not intended to replace advice given to you by your health care provider. Make sure you discuss any questions you have with your health care provider. Document Released: 01/01/2016 Document Revised: 08/24/2016 Document Reviewed: 10/06/2015 Elsevier Interactive Patient Education  2017 [place] Prevention in the Home Falls can cause injuries. They can happen to people of all ages. There are many things you can do to make your home safe and to help prevent falls. What can I do on the outside of my home? Regularly fix the edges of walkways and driveways and fix any cracks. Remove anything that might make you trip as you walk through a door, such as a raised step or threshold. Trim any bushes or trees on the path to your home. Use bright outdoor lighting. Clear any walking paths of anything that might make someone trip, such as rocks or tools. Regularly check to see if handrails are loose or broken. Make sure that both sides of any steps have handrails. Any raised decks and porches should have guardrails on the edges. Have any leaves, snow, or ice cleared regularly. Use sand or salt on walking paths during winter. Clean up any spills in your garage right away. This includes oil or grease spills. What can I do in the bathroom? Use night lights. Install grab bars by the toilet and in the tub and shower. Do not use towel bars as grab bars. Use non-skid mats or decals in the tub or shower. If you need to sit down in the shower, use a plastic, non-slip stool. Keep the floor dry. Clean up any water that spills on the floor as soon as it happens. Remove soap buildup in the tub or shower regularly. Attach bath mats securely with double-sided non-slip rug tape. Do not have throw rugs and other things on the floor that can make you trip. What can I do in the bedroom? Use night lights. Make  sure that you have a light by your bed that is easy to reach. Do not use any sheets or blankets that are too big for your bed. They should not hang down onto the floor. Have a firm chair that has side arms. You can use this for support while you get dressed. Do not have throw rugs and other things on the floor that can make you trip. What can I do in the kitchen? Clean up any spills right away. Avoid walking on wet floors. Keep items that you use a lot in easy-to-reach places. If you need to reach something above you, use a strong step stool that has a grab bar. Keep electrical cords out of the way. Do not use floor polish or wax that makes floors slippery. If you must use wax, use non-skid floor wax. Do not have throw rugs and other things on the floor that can make you trip. What can I do with my stairs? Do not leave any items on the stairs. Make sure that there are handrails on both sides of the stairs and use them. Fix handrails that are broken or loose. Make sure that handrails are as long as the stairways. Check any carpeting to make sure that it is firmly attached to the stairs. Fix any carpet that is loose or worn. Avoid having throw rugs at the top or bottom of the stairs. If you do have throw rugs,  attach them to the floor with carpet tape. Make sure that you have a light switch at the top of the stairs and the bottom of the stairs. If you do not have them, ask someone to add them for you. What else can I do to help prevent falls? Wear shoes that: Do not have high heels. Have rubber bottoms. Are comfortable and fit you well. Are closed at the toe. Do not wear sandals. If you use a stepladder: Make sure that it is fully opened. Do not climb a closed stepladder. Make sure that both sides of the stepladder are locked into place. Ask someone to hold it for you, if possible. Clearly mark and make sure that you can see: Any grab bars or handrails. First and last steps. Where the  edge of each step is. Use tools that help you move around (mobility aids) if they are needed. These include: Canes. Walkers. Scooters. Crutches. Turn on the lights when you go into a dark area. Replace any light bulbs as soon as they burn out. Set up your furniture so you have a clear path. Avoid moving your furniture around. If any of your floors are uneven, fix them. If there are any pets around you, be aware of where they are. Review your medicines with your doctor. Some medicines can make you feel dizzy. This can increase your chance of falling. Ask your doctor what other things that you can do to help prevent falls. This information is not intended to replace advice given to you by your health care provider. Make sure you discuss any questions you have with your health care provider. Document Released: 10/01/2009 Document Revised: 05/12/2016 Document Reviewed: 01/09/2015 Elsevier Interactive Patient Education  2017 [employer].

## 2022-07-20 NOTE — Progress Notes (Unsigned)
Tristan Hamilton [place] Phone: [phone] Subjective:   Fontaine No, am serving as a scribe for Dr. Hulan Saas.  I'm seeing this patient by the request  of:  Pcp, No  CC: Bilateral shoulder, wrist and back pain as well as cyst   PFX:[id number]  06/09/2022 Patient continues to have the difficulty when he is on the prednisone.  At this moment concern for more of a cervical radiculopathy.  X-rays of the neck ordered.  Gabapentin given as well.  In the lumbar area.  Attempted to remove but unfortunately deeper than what it was anticipated.  Patient given doxycycline for now.  Patient will be referred to general surgery for removal in the future.  Patient will be going out of town this weekend and then will be available.  Aspirated on the ulnar aspect of the dorsal portion of the wrist.  Patient is extremely well to it.  Hopefully patient will get good response.  Discussed any redness to take antibiotic.  Follow-up again in 6 to 8 weeks  Update 07/21/2022 Gable Odonohue is a 71 y.o. male coming in with complaint of B shoulder, B wrist, R>L and back pain. Patient states that his wrist pain is no better than last visit. Patient is on new heart medication and his pain has increased. Patient stopped medication 2 days ago per his cardiologist.   Pain in R shoulder in bicep and front of his shoulder. L side also hurts in same area but not as much as the right.  Patient had surgery on inclusion cyst 07/01/2022 and this is healing.       Past Medical History:  Diagnosis Date   DVT (deep venous thrombosis) (HCC)    left leg calf, 4 yrs. ago   Enlarged prostate    GERD (gastroesophageal reflux disease)    Headache    h/o of ocular migranes   Heart murmur    History of kidney stones    Osteoarthritis of both knees    Prostate cancer [facility]) 2013   Physician in [place], had another bx. and was negative   Past Surgical History:   Procedure Laterality Date   CYSTOSCOPY WITH INSERTION OF UROLIFT     FINGER SURGERY Right    middle finger- got stitches   right knee scope  1975   skiing accident     stitches in head   TESTICLE REMOVAL Left    undescended   TONSILLECTOMY     TOTAL KNEE ARTHROPLASTY Bilateral 03/24/2020   TOTAL KNEE ARTHROPLASTY Bilateral 03/24/2020   Procedure: TOTAL KNEE BILATERAL;  Surgeon: [name], MD;  Location: [facility];  Service: Orthopedics;  Laterality: Bilateral;   VASECTOMY     Social History   Socioeconomic History   Marital status: Married    Spouse name: Not on file   Number of children: Not on file   Years of education: Not on file   Highest education level: Some college, no degree  Occupational History   Not on file  Tobacco Use   Smoking status: Never   Smokeless tobacco: Never  Vaping Use   Vaping Use: Never used  Substance and Sexual Activity   Alcohol use: Never   Drug use: Never   Sexual activity: Not on file  Other Topics Concern   Not on file  Social History Narrative   Not on file   Social Determinants of Health   Financial Resource Strain: Low Risk  (  07/18/2022)   Overall Financial Resource Strain (CARDIA)    Difficulty of Paying Living Expenses: Not hard at all  Food Insecurity: No Food Insecurity (07/18/2022)   Hunger Vital Sign    Worried About Running Out of Food in the Last Year: Never true    Ran Out of Food in the Last Year: Never true  Transportation Needs: No Transportation Needs (07/18/2022)   PRAPARE - Hydrologist (Medical): No    Lack of Transportation (Non-Medical): No  Physical Activity: Inactive (07/18/2022)   Exercise Vital Sign    Days of Exercise per Week: 0 days    Minutes of Exercise per Session: 0 min  Stress: No Stress Concern Present (07/18/2022)   [place]    Feeling of Stress : Not at all  Social Connections: [place] (03/22/2022)   Social Connection and Isolation Panel [NHANES]    Frequency of Communication with Friends and Family: More than three times a week    Frequency of Social Gatherings with Friends and Family: More than three times a week    Attends Religious Services: More than 4 times per year    Active Member of [employer] or Organizations: Yes    Attends Music therapist: More than 4 times per year    Marital Status: Married   Allergies  Allergen Reactions   Crestor [Rosuvastatin]     MYALGIAS   Lipitor [Atorvastatin]     MYLAGIAS   Family History  Problem Relation Age of Onset   Diverticulitis Mother    Penile cancer Father    Stroke Sister 40       covid, then massive stroke   Prostate cancer Maternal Grandfather    Colon cancer Neg Hx    Colon polyps Neg Hx    Esophageal cancer Neg Hx    Rectal cancer Neg Hx    Stomach cancer Neg Hx      Current Outpatient Medications (Cardiovascular):    NEXLETOL 180 MG TABS, Take 1 tablet by mouth daily.    Current Outpatient Medications (Hematological):    vitamin B-12 (CYANOCOBALAMIN) 50 MCG tablet, Take 50 mcg by mouth daily.  Current Outpatient Medications (Other):    amoxicillin (AMOXIL) 500 MG tablet, Take 4 tablets by mouth as needed.   Cholecalciferol 125 MCG (5000 UT) capsule, Take 5,000 Units by mouth daily.    cimetidine (CIMETIDINE 200) 200 MG tablet, Take 1 tablet (200 mg total) by mouth 2 (two) times daily as needed. Pt isn't sure of the dosage   Coenzyme Q10 100 MG TABS, Take 1 tablet by mouth daily. Takes 200 mg   DHEA 50 MG TABS, Take 1 tablet by mouth daily.   Hyprom-Naphaz-Polysorb-Zn Sulf (CLEAR EYES COMPLETE OP), Apply to eye.   Reviewed prior external information including notes and imaging from  primary care provider As well as notes that were available from care everywhere and other healthcare systems.  Patient was sent to general surgery and had an epidermal cyst removed.  Also reviewed  cardiology follow-up where patient continues to have discomfort and pain and they are considering a change to Repatha  Reviewed previous x-rays of the wrist bilaterally and February again showing only mild arthritic changes of the left wrist and no arthritis on the side.  Patient does have calcifications in the radial ulnar space that could be potentially vascular.  Past medical history, social, surgical and family history all reviewed in  electronic medical record.  No pertanent information unless stated regarding to the chief complaint.   Review of Systems:  No headache, visual changes, nausea, vomiting, diarrhea, constipation, dizziness, abdominal pain, skin rash, fevers, chills, night sweats, weight loss, swollen lymph nodes,  joint swelling, chest pain, shortness of breath, mood changes. POSITIVE muscle aches, body aches  Objective  Blood pressure 112/72, pulse (!) 44, height '5\' 10"'$  (1.778 m), weight 204 lb (92.5 kg), SpO2 98 %.   General: No apparent distress alert and oriented x3 mood and affect normal, dressed appropriately.  HEENT: Pupils equal, extraocular movements intact  Respiratory: Patient's speak in full sentences and does not appear short of breath  Cardiovascular: No lower extremity edema, non tender, no erythema  Patient continues to have pain mostly on the right side of the upper extremities at that time.  The patient does have tenderness to palpation somewhat over the elbow itself.  Seems to be more on the medial condyle area.  Patient's right shoulder does have mild positive impingement noted. Right wrist exam has more pain on the ulnar aspect.  Limited muscular skeletal ultrasound was performed and interpreted by Hulan Saas, M  Limited ultrasound of patient's right wrist does show that with dynamic testing there is a hypoechoic change noted in the TFCC.  No true acute tear though noted at the moment. Mild hypoechoic changes noted of the supraspinatus.  Questionable mild  waiting aspect of the supraspinatus that could be consistent with a possible tear. Impression: Ulnar and TFCC variation noted of the right wrist and possible rotator cuff pathology of the shoulder.   Impression and Recommendations:    The above documentation has been reviewed and is accurate and complete Lyndal Pulley, DO

## 2022-07-21 ENCOUNTER — Ambulatory Visit: Payer: Self-pay

## 2022-07-21 ENCOUNTER — Encounter: Payer: Self-pay | Admitting: Family Medicine

## 2022-07-21 ENCOUNTER — Ambulatory Visit (INDEPENDENT_AMBULATORY_CARE_PROVIDER_SITE_OTHER): Payer: Medicare Other | Admitting: Family Medicine

## 2022-07-21 VITALS — BP 112/72 | HR 44 | Ht 70.0 in | Wt 204.0 lb

## 2022-07-21 DIAGNOSIS — M25531 Pain in right wrist: Secondary | ICD-10-CM | POA: Diagnosis not present

## 2022-07-21 DIAGNOSIS — M25512 Pain in left shoulder: Secondary | ICD-10-CM | POA: Diagnosis not present

## 2022-07-21 DIAGNOSIS — M25532 Pain in left wrist: Secondary | ICD-10-CM

## 2022-07-21 DIAGNOSIS — M25511 Pain in right shoulder: Secondary | ICD-10-CM | POA: Diagnosis not present

## 2022-07-21 NOTE — Patient Instructions (Signed)
[place] for wrist RTC exercises See me again in 2 months

## 2022-07-21 NOTE — Assessment & Plan Note (Signed)
Mild abnormality of the TFCC.  There is some hypoechoic changes noted on the lateral aspect of the wrist.  Seems to be possible impingement noted with some ulnar variance.  Discussed the possibility of injection which patient declined.  Follow-up with me again in 6 to 8 weeks after taping with activity.  Worsening pain consider injection.

## 2022-07-21 NOTE — Assessment & Plan Note (Signed)
Bilateral shoulder pain.  Seems to be right greater than left.  Partial and possible intersubstance tearing noted of the right rotator cuff.  Discussed different treatment options.  Patient given home exercises.  Could be still secondary to the possibility of some of patient's medication use as well.  Discussed with patient know about icing regimen and home exercises.  Increase activity slowly.  Follow-up again in 6 to 8 weeks.

## 2022-07-26 NOTE — Telephone Encounter (Signed)
Noted  

## 2022-07-27 ENCOUNTER — Telehealth: Payer: Self-pay | Admitting: Pharmacist

## 2022-07-27 MED ORDER — REPATHA SURECLICK 140 MG/ML ~~LOC~~ SOAJ: 1.0000 mL | SUBCUTANEOUS | 11 refills | Status: DC

## 2022-07-27 NOTE — Telephone Encounter (Signed)
Patient sent my chart message that Nexletol was causing myalgias.   Completed PA for Repatha.  Key: [id number]

## 2022-07-27 NOTE — Addendum Note (Signed)
Addended by: Rollen Sox on: 07/27/2022 04:25 PM   Modules accepted: Orders

## 2022-07-27 NOTE — Telephone Encounter (Signed)
First PA denied because it was sent to wrong plan.  Resending to other plan on acct.  New Key: [id number]

## 2022-07-28 ENCOUNTER — Telehealth: Payer: Self-pay | Admitting: Pharmacist

## 2022-07-28 NOTE — Telephone Encounter (Signed)
Patient sent mychart message asking for benefits investiagtion for [employer].  Completed form online and patient will have to electronically sign

## 2022-07-29 ENCOUNTER — Encounter: Payer: Self-pay | Admitting: Family Medicine

## 2022-07-29 ENCOUNTER — Ambulatory Visit
Admission: RE | Admit: 2022-07-29 | Discharge: 2022-07-29 | Disposition: A | Discharge status: Home / Self Care | Payer: Medicare Other | Source: Ambulatory Visit | Attending: Cardiology | Admitting: Cardiology

## 2022-07-29 ENCOUNTER — Ambulatory Visit (INDEPENDENT_AMBULATORY_CARE_PROVIDER_SITE_OTHER): Payer: Medicare Other | Admitting: Family Medicine

## 2022-07-29 VITALS — BP 118/62 | HR 60 | Temp 97.7°F | Ht 70.0 in | Wt 202.6 lb

## 2022-07-29 DIAGNOSIS — R7303 Prediabetes: Secondary | ICD-10-CM | POA: Insufficient documentation

## 2022-07-29 DIAGNOSIS — I77819 Aortic ectasia, unspecified site: Secondary | ICD-10-CM

## 2022-07-29 DIAGNOSIS — R739 Hyperglycemia, unspecified: Secondary | ICD-10-CM | POA: Diagnosis not present

## 2022-07-29 DIAGNOSIS — E782 Mixed hyperlipidemia: Secondary | ICD-10-CM

## 2022-07-29 LAB — POCT GLYCOSYLATED HEMOGLOBIN (HGB A1C): Hemoglobin A1C: 5.9 % — AB (ref 4.0–5.6)

## 2022-07-29 MED ORDER — IOPAMIDOL (ISOVUE-370) INJECTION 76%
75.0000 mL | Freq: Once | INTRAVENOUS | Status: AC | PRN
  Administered 2022-07-29: 75 mL via INTRAVENOUS

## 2022-07-29 NOTE — Progress Notes (Unsigned)
- - - - -+-----  Established Patient Office Visit  Subjective   Patient ID: Tristan Hamilton, male    DOB: 1951/11/18  Age: 71 y.o. MRN: [id number]  Chief Complaint  Patient presents with   Establish Care    Pt is here for [facility] visit. Patient reports that he recently had a CT angio this morning. States he has trouble tolerating the statin therapies. States he stopped the medication almost a year ago but he continues to have myalgias diffusely. He has been taking prednisone 5 mg daily chronically since the myalgias and joint pain persisted despite stopping the statin. States that he continues to follow with rheumatology and he tries to stop the prednisone but the joint pain and myalgias return.   I reviewed his laboratory work-- his blood sugar was elevated at several instances so I ordered an A1c to be performed today. A1C is 5.9, this is likely due to the prolonged use of prednisone.    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/24/2020   Acquired renal cyst of right kidney 01/21/2020   Prostate cancer ([place]) 11/01/2019   Enlarged prostate 11/01/2019   Vitamin D deficiency 11/01/2019   Bilateral primary osteoarthritis of knee 09/17/2019     Current Outpatient Medications  Medication Instructions   amoxicillin (AMOXIL) 500 MG tablet 4 tablets, Oral, As needed   Cholecalciferol 5,000 Units, Oral, Daily   cimetidine (CIMETIDINE 200) 200 mg, Oral, 2 times daily PRN, Pt isn't sure of the dosage   Coenzyme Q10 100 MG TABS 1 tablet, Oral, Daily, Takes 200 mg   DHEA 50 MG TABS 1 tablet,  Oral, Daily   Evolocumab (REPATHA SURECLICK) 546 MG/ML SOAJ 1 mL, Subcutaneous, Every 14 days   Hyprom-Naphaz-Polysorb-Zn Sulf (CLEAR EYES COMPLETE OP) Ophthalmic   PREDNISONE PO 5 mg, Oral, Daily   vitamin B-12 (CYANOCOBALAMIN) 50 mcg, Oral, Daily     Review of Systems  All other systems reviewed and are negative.     Objective:     BP 118/62 (BP Location: Left Arm, Patient Position: Sitting, Cuff Size: Large)   Pulse 60   Temp 97.7 F (36.5 C) (Oral)   Ht _0  (1.778 m)   Wt 202 lb 9.6 oz (91.9 kg)   SpO2 96%   BMI 29.07 kg/m  BP Readings from Last 3 Encounters:  07/29/22 118/62  07/21/22 112/72  06/09/22 136/76      Physical Exam Vitals reviewed.  Constitutional:      Appearance: Normal appearance. He is well-groomed and normal weight.  HENT:     Head: Normocephalic and atraumatic.  Eyes:     Extraocular Movements: Extraocular movements intact.     Conjunctiva/sclera: Conjunctivae normal.     Pupils: Pupils are equal, round, and reactive to light.  Cardiovascular:     Rate and Rhythm: Normal rate and regular rhythm.     Pulses: Normal pulses.     Heart sounds: S1 normal and S2 normal. No murmur heard. Pulmonary:     Effort: Pulmonary effort is normal.     Breath sounds: Normal breath sounds and air  entry. No rales.  Abdominal:     General: Abdomen is flat. Bowel sounds are normal.     Palpations: Abdomen is soft.     Tenderness: There is no abdominal tenderness.  Musculoskeletal:     Right lower leg: No edema.     Left lower leg: No edema.  Neurological:     General: No focal deficit present.     Mental Status: He is alert and oriented to person, place, and time.     Gait: Gait is intact.     Deep Tendon Reflexes: Reflexes are normal and symmetric.  Psychiatric:        Mood and Affect: Mood and affect normal.      Results for orders placed or performed in visit on 07/29/22  POC HgB A1c  Result Value Ref Range   Hemoglobin A1C 5.9 (A) 4.0 - 5.6  %   HbA1c POC (<> result, manual entry)     HbA1c, POC (prediabetic range)     HbA1c, POC (controlled diabetic range)      Last metabolic panel Lab Results  Component Value Date   GLUCOSE 133 (H) 12/06/2021   NA 143 12/06/2021   K 4.2 12/06/2021   CL 101 12/06/2021   CO2 29 12/06/2021   BUN 13 12/06/2021   CREATININE 0.97 12/06/2021   EGFR 84 12/06/2021   CALCIUM 10.1 12/06/2021   PROT 6.7 02/15/2022   ALBUMIN 4.7 02/15/2022   BILITOT 0.9 02/15/2022   ALKPHOS 69 02/15/2022   AST 22 02/15/2022   ALT 23 02/15/2022   ANIONGAP 7 03/26/2020   Last lipids Lab Results  Component Value Date   CHOL 137 02/15/2022   HDL 45 02/15/2022   LDLCALC 69 02/15/2022   TRIG 132 02/15/2022   CHOLHDL 3.0 02/15/2022      The 10-year ASCVD risk score (Arnett DK, et al., 2019) is: 26.8%    Assessment & Plan:   Problem List Items Addressed This Visit       Other   Hyperlipidemia    Reviewed last lipid panel, he is currently on repatha injections and seems to be doing well with these.       Prediabetes    A1C is slightly high indicating prediabetes, however I suspect that the daily prednisone use for his myalgias is likely the cause of these elevations. We had a long discussion about this and I suggested reducing the prednisone use to every other day or 2.5 mg daily to help reduce his blood sugars. Will continue to monitor his A1C every 6 months.      Other Visit Diagnoses     Hyperglycemia    -  Primary   Relevant Orders   POC HgB A1c (Completed)       Return in about 6 months (around 01/29/2023) for A1C monitoring.    Farrel Conners, MD

## 2022-07-29 NOTE — Patient Instructions (Signed)
You may go to any pharmacy to receive the prenvar 20 vaccine. This protects against the most common strains of pneumococcal pneumonia.  A1C-- 5.9 - likely due to the prednisone medication, continue to increase exercise ad monitor sugar and starches in your diet.

## 2022-07-31 NOTE — Assessment & Plan Note (Signed)
A1C is slightly high indicating prediabetes, however I suspect that the daily prednisone use for his myalgias is likely the cause of these elevations. We had a long discussion about this and I suggested reducing the prednisone use to every other day or 2.5 mg daily to help reduce his blood sugars. Will continue to monitor his A1C every 6 months.

## 2022-07-31 NOTE — Assessment & Plan Note (Signed)
Reviewed last lipid panel, he is currently on repatha injections and seems to be doing well with these.

## 2022-08-09 NOTE — Telephone Encounter (Signed)
Patient called stating he did not get an email from  [email or web address] and would like to follow-up on getting the Repatha injection.

## 2022-08-19 ENCOUNTER — Telehealth: Payer: Self-pay | Admitting: Pharmacy Technician

## 2022-08-19 ENCOUNTER — Other Ambulatory Visit: Payer: Self-pay | Admitting: Pharmacy Technician

## 2022-08-19 NOTE — Telephone Encounter (Addendum)
Auth Submission: APPROVED Payer: [employer] ADVT Medication & CPT/J Code(s) submitted: [employer] (Inclisiran) [id number] Route of submission (phone, fax, portal): PORTAl Phone # Fax # Auth type: Buy/Bill Units/visits requested: X3 DOSES Reference number: [id number] Approval from: 08/19/22 to  08/20/23 at [place] will reach out to patient to confirm patient is covered 100%. Once [employer] (service center) speaks with patient [facility] will schedule patient as soon as possible. Asencion Partridge: [phone] Fax: [phone]

## 2022-08-23 ENCOUNTER — Encounter: Payer: Self-pay | Admitting: Cardiology

## 2022-08-31 ENCOUNTER — Other Ambulatory Visit: Payer: Self-pay | Admitting: Urology

## 2022-08-31 NOTE — Telephone Encounter (Addendum)
error 

## 2022-09-05 ENCOUNTER — Ambulatory Visit: Payer: Medicare Other

## 2022-09-06 ENCOUNTER — Ambulatory Visit: Payer: Medicare Other | Attending: Nurse Practitioner | Admitting: Nurse Practitioner

## 2022-09-06 ENCOUNTER — Other Ambulatory Visit: Payer: Self-pay | Admitting: Pharmacist

## 2022-09-06 ENCOUNTER — Encounter: Payer: Self-pay | Admitting: Nurse Practitioner

## 2022-09-06 VITALS — BP 126/74 | HR 66 | Ht 70.0 in | Wt 203.0 lb

## 2022-09-06 DIAGNOSIS — R001 Bradycardia, unspecified: Secondary | ICD-10-CM

## 2022-09-06 DIAGNOSIS — I2510 Atherosclerotic heart disease of native coronary artery without angina pectoris: Secondary | ICD-10-CM

## 2022-09-06 DIAGNOSIS — E785 Hyperlipidemia, unspecified: Secondary | ICD-10-CM

## 2022-09-06 DIAGNOSIS — I10 Essential (primary) hypertension: Secondary | ICD-10-CM

## 2022-09-06 DIAGNOSIS — I493 Ventricular premature depolarization: Secondary | ICD-10-CM | POA: Diagnosis not present

## 2022-09-06 DIAGNOSIS — E782 Mixed hyperlipidemia: Secondary | ICD-10-CM

## 2022-09-06 DIAGNOSIS — I6523 Occlusion and stenosis of bilateral carotid arteries: Secondary | ICD-10-CM

## 2022-09-06 DIAGNOSIS — I77819 Aortic ectasia, unspecified site: Secondary | ICD-10-CM

## 2022-09-06 NOTE — Patient Instructions (Signed)
Medication Instructions:  Your physician recommends that you continue on your current medications as directed. Please refer to the Current Medication list given to you today.   *If you need a refill on your cardiac medications before your next appointment, please call your pharmacy*   Lab Work: NONE ordered at this time of appointment   If you have labs (blood work) drawn today and your tests are completely normal, you will receive your results only by: MyChart Message (if you have MyChart) OR A paper copy in the mail If you have any lab test that is abnormal or we need to change your treatment, we will call you to review the results.   Testing/Procedures: NONE ordered at this time of appointment     Follow-Up: At [facility], you and your health needs are our priority.  As part of our continuing mission to provide you with exceptional heart care, we have created designated Provider Care Teams.  These Care Teams include your primary Cardiologist (physician) and Advanced Practice Providers (APPs -  Physician Assistants and Nurse Practitioners) who all work together to provide you with the care you need, when you need it.  We recommend signing up for the patient portal called "MyChart".  Sign up information is provided on this After Visit Summary.  MyChart is used to connect with patients for Virtual Visits (Telemedicine).  Patients are able to view lab/test results, encounter notes, upcoming appointments, etc.  Non-urgent messages can be sent to your provider as well.   To learn more about what you can do with MyChart, go to [email or web address].    Your next appointment:   1 year(s)  The format for your next appointment:   In Person  Provider:   Christopher L Schumann, MD     Other Instructions   Important Information About Sugar       

## 2022-09-06 NOTE — Progress Notes (Signed)
Office Visit    Patient Name: Tristan Hamilton Date of Encounter: 09/06/2022  Primary Care Provider:  Farrel Conners, MD Primary Cardiologist:  Donato Heinz, MD  Chief Complaint    71 year old male with a history of elevated coronary calcium score, sinus bradycardia, PVCs, hypertension, hyperlipidemia, dilation of ascending aorta, DVT, GERD and prostate cancer who presents for follow-up related to hypertension and hyperlipidemia.  Past Medical History    Past Medical History:  Diagnosis Date   DVT (deep venous thrombosis) (HCC)    left leg calf, 4 yrs. ago   Enlarged prostate    GERD (gastroesophageal reflux disease)    Headache    h/o of ocular migranes   Heart murmur    History of kidney stones    Osteoarthritis of both knees    Prostate cancer [facility]) 2013   Physician in [place], had another bx. and was negative   Past Surgical History:  Procedure Laterality Date   CYSTOSCOPY WITH INSERTION OF UROLIFT     FINGER SURGERY Right    middle finger- got stitches   right knee scope  1975   skiing accident     stitches in head   TESTICLE REMOVAL Left    undescended   TONSILLECTOMY     TOTAL KNEE ARTHROPLASTY Bilateral 03/24/2020   TOTAL KNEE ARTHROPLASTY Bilateral 03/24/2020   Procedure: TOTAL KNEE BILATERAL;  Surgeon: [name], MD;  Location: [place];  Service: Orthopedics;  Laterality: Bilateral;   VASECTOMY      Allergies  Allergies  Allergen Reactions   Crestor [Rosuvastatin]     MYALGIAS   Lipitor [Atorvastatin]     MYLAGIAS   Nexletol [Bempedoic Acid]     Muscle aches    History of Present Illness    71 year old male with the above past medical history including elevated coronary calcium score, sinus bradycardia, PVCs, hypertension, hyperlipidemia, dilation of ascending aorta, DVT, GERD and prostate cancer.  Echocardiogram in 2021 showed EF 60 to 65%, normal LV function, no RWMA, mild LVH, normal RV systolic function, no significant  valvular abnormalities.  Coronary calcium score in 07/2021 was elevated at 237 (59th percentile), mild dilation of ascending aorta, 40 mm.  CT angio chest/aorta in 07/2022 showed normal caliber ascending thoracic aorta, measuring up to 3.7 cm.  Outpatient cardiac monitor in 2021 in the setting of palpitations in revealed sinus bradycardia, HR as low as 35 bpm.  He was last seen in the office on 08/13/2021 and was stable from a cardiac standpoint.  He denied any symptoms concerning for angina.  He was asymptomatic from his bradycardia.  He was started on rosuvastatin.  Carotid dopplers in 06/2022 revealed 1 to 39% B ICA stenosis.  He was unable to tolerate statin therapy and was referred to the lipid clinic Pharm.D. and was eventually started on Repatha.   He presents today for follow-up. Since his last visit has been stable from a cardiac standpoint.  He denies any symptoms concerning for angina.  He remains asymptomatic with bradycardia.  He never started taking Repatha and is currently in the process of awaiting confirmation of insurance approval for Leqvio.  He notes he continues to have myalgias which he thinks is related to prior statin therapy.  Otherwise, he reports feeling well denies any additional concerns today.  Home Medications    Current Outpatient Medications  Medication Sig Dispense Refill   amoxicillin (AMOXIL) 500 MG tablet Take 4 tablets by mouth as needed.  Cholecalciferol 125 MCG (5000 UT) capsule Take 5,000 Units by mouth daily.      cimetidine (CIMETIDINE 200) 200 MG tablet Take 1 tablet (200 mg total) by mouth 2 (two) times daily as needed. Pt isn't sure of the dosage 60 tablet 2   Coenzyme Q10 100 MG TABS Take 1 tablet by mouth daily. Takes 200 mg     DHEA 50 MG TABS Take 1 tablet by mouth daily.     Hyprom-Naphaz-Polysorb-Zn Sulf (CLEAR EYES COMPLETE OP) Apply to eye.     PREDNISONE PO Take 5 mg by mouth daily.     vitamin B-12 (CYANOCOBALAMIN) 50 MCG tablet Take 50 mcg by  mouth daily.     No current facility-administered medications for this visit.     Review of Systems   He denies chest pain, palpitations, dyspnea, pnd, orthopnea, n, v, dizziness, syncope, edema, weight gain, or early satiety. All other systems reviewed and are otherwise negative except as noted above.   Physical Exam    VS:  BP 126/74 (BP Location: Left Arm, Patient Position: Sitting, Cuff Size: Normal)   Pulse 66   Ht '5\' 10"'$  (1.778 m)   Wt 203 lb (92.1 kg)   BMI 29.13 kg/m   GEN: Well nourished, well developed, in no acute distress. HEENT: normal. Neck: Supple, no JVD, carotid bruits, or masses. Cardiac: RRR, no murmurs, rubs, or gallops. No clubbing, cyanosis, edema.  Radials/DP/PT 2+ and equal bilaterally.  Respiratory:  Respirations regular and unlabored, clear to auscultation bilaterally. GI: Soft, nontender, nondistended, BS + x 4. MS: no deformity or atrophy. Skin: warm and dry, no rash. Neuro:  Strength and sensation are intact. Psych: Normal affect.  Accessory Clinical Findings    ECG personally reviewed by me today - Sinus rhythm, 66 bpm, sinus arrhythmia - no acute changes.   Lab Results  Component Value Date   WBC 7.0 10/27/2021   HGB 15.9 10/27/2021   HCT 47.2 10/27/2021   MCV 92.5 10/27/2021   PLT 230.0 10/27/2021   Lab Results  Component Value Date   CREATININE 0.97 12/06/2021   BUN 13 12/06/2021   NA 143 12/06/2021   K 4.2 12/06/2021   CL 101 12/06/2021   CO2 29 12/06/2021   Lab Results  Component Value Date   ALT 23 02/15/2022   AST 22 02/15/2022   ALKPHOS 69 02/15/2022   BILITOT 0.9 02/15/2022   Lab Results  Component Value Date   CHOL 137 02/15/2022   HDL 45 02/15/2022   LDLCALC 69 02/15/2022   TRIG 132 02/15/2022   CHOLHDL 3.0 02/15/2022    Lab Results  Component Value Date   HGBA1C 5.9 (A) 07/29/2022    Assessment & Plan    1. CAD:  Coronary calcium score in 07/2021 was elevated at 237 (59th percentile), mild dilation of  ascending aorta, 40 mm.  Stable with no anginal symptoms. No indication for ischemic evaluation.  He is pending initiation of Leqvio as below.   2. Sinus bradycardia/PVCs: Asymptomatic.   3. Hypertension: BP well controlled. Continue current antihypertensive regimen.   4. Hyperlipidemia: LDL was 69 in 01/2022.  He is statin intolerant.  He was awaiting approval of PA for Leqvio.  I spoke with our clinical pharmacist today who states PA has been approved with no co-pay.  He apparently missed his appointment to schedule his infusion. PharmD states he will send a message to infusion clinic to have patient rescheduled.  5. Ascending aorta dilation:  CT  angio chest/aorta in 07/2022 showed normal caliber ascending thoracic aorta, measuring up to 3.7 cm. Consider repeat CT in 1 year.   6. Carotid artery stenosis: Carotid Dopplers in 06/2022 showed 1-39% BICA stenosis. Consider repeat dopplers in 1 year.   7. Disposition: Follow-up in 1 year.      Lenna Sciara, NP 09/06/2022, 10:04 AM

## 2022-09-07 NOTE — Telephone Encounter (Signed)
F/u: [employer] PAP has been withdrawn due to patient request to cancel application. I have spoken with patient in attempt to discuss and clarify patient assistance and patient did not want to re-enroll in program. I explained to patient he will have 20% co-insurance and patient explained he is not willing to pay 20%. Patient states he will call back when he has time to discuss options of payment. Patient appt has been cancelled, awaiting call back. [employer] PAP: [phone] ext: [phone] Rep: Kim-G

## 2022-09-16 ENCOUNTER — Encounter (HOSPITAL_BASED_OUTPATIENT_CLINIC_OR_DEPARTMENT_OTHER): Payer: Self-pay | Admitting: Urology

## 2022-09-16 NOTE — Progress Notes (Signed)
Spoke w/ via phone for pre-op interview--- pt Lab needs dos----   no            Lab results------ current ekg in epic/ chart COVID test -----patient states asymptomatic no test needed Arrive at ------- 0730 on 09-28-2022 NPO after MN NO Solid Food.  Clear liquids from MN until--- 0630 Med rec completed Medications to take morning of surgery ----- cimetidine, prednisone, rapaflo Diabetic medication ----- n/a Patient instructed no nail polish to be worn day of surgery Patient instructed to bring photo id and insurance card day of surgery Patient aware to have Driver (ride ) / caregiver for 24 hours after surgery -- wife, faith Patient Special Instructions ----- n/a Pre-Op special Istructions ----- n/a Patient verbalized understanding of instructions that were given at this phone interview. Patient denies shortness of breath, chest pain, fever, cough at this phone interview.

## 2022-09-19 ENCOUNTER — Telehealth: Payer: Self-pay | Admitting: Pharmacist

## 2022-09-19 NOTE — Telephone Encounter (Signed)
Pt c/o medication issue:  1. Name of Medication: Leqvio  2. How are you currently taking this medication (dosage and times per day)?   3. Are you having a reaction (difficulty breathing--STAT)?   4. What is your medication issue? Patient says it is too  expensive

## 2022-09-21 NOTE — Progress Notes (Unsigned)
[place] Phone: [phone] Subjective:   Tristan Hamilton, am serving as a scribe for Dr. Hulan Saas. I'm seeing this patient by the request  of:  Farrel Conners, MD  CC: Wrist pain, shoulder pain, allover pain  [id number]  07/21/2022 Mild abnormality of the TFCC.  There is some hypoechoic changes noted on the lateral aspect of the wrist.  Seems to be possible impingement noted with some ulnar variance.  Discussed the possibility of injection which patient declined.  Follow-up with me again in 6 to 8 weeks after taping with activity.  Worsening pain consider injection.  (Physician)             Bilateral shoulder pain.  Seems to be right greater than left.  Partial and possible intersubstance tearing noted of the right rotator cuff.  Discussed different treatment options.  Patient given home exercises.  Could be still secondary to the possibility of some of patient's medication use as well.  Discussed with patient know about icing regimen and home exercises.  Increase activity slowly.  Follow-up again in 6 to 8 weeks.      Update 09/22/2022 Tristan Hamilton is a 71 y.o. male coming in with complaint of B wrist and B shoulder pain. Patient states that he is the same as last visit. Prednisone was helpful but pain came back immediately when he stopped.  Patient states that he did feel better for some time but now having worsening pain that can affect daily activities as well wake him up at night.  States that his right wrist probably seems to be the worst.  Patient states that if he tries to do too much activity or tries to push up from a seated position he has increasing discomfort.      Past Medical History:  Diagnosis Date   Ascending aorta dilation ([facility])    last CTA 07-29-2022  3.7cm   Asymptomatic bilateral carotid artery stenosis    doppler 06-22-2022 bilateral ICA 1-39%   Bladder calculi    BPH (benign  prostatic hyperplasia)    Coronary artery calcification seen on CT scan    cardiologist--- dr c. [name];   coronary calcium score CT 07-27-2022 (237)   GERD (gastroesophageal reflux disease)    Heart murmur    History of DVT of lower extremity 1975   LLE  post op knee arthroscopy right side ;   treated with blood thinner,  per pt Hamilton clot before or since 1975   History of kidney stones    History of migraine    h/o of ocular migranes   History of prostate cancer    urologist--- dr winter;  per dr winter note dx 2011 w/ Gleason 6 in [place] , [place];  active survillance  (09-16-2022  per pt last bx done did not show any cancer)   Hyperlipidemia, mixed    Myalgia, multiple sites    09-16-2022 per pt chronic multiple site ,  intermittant since taking statin medication's,  takes prednisone daily   Osteoarthritis of both knees    Pre-diabetes    Sinus bradycardia seen on cardiac monitor 02/24/2020   lowest heart rate 35 w/ occasional PVCs / PACs   Wears dentures    upper   Wears glasses    Past Surgical History:  Procedure Laterality Date   CYSTOSCOPY WITH INSERTION OF UROLIFT  2012   KNEE ARTHROSCOPY Right 1975   ORCHIECTOMY Left  child   TONSILLECTOMY     child   TOTAL KNEE ARTHROPLASTY Bilateral 03/24/2020   Procedure: TOTAL KNEE BILATERAL;  Surgeon: [name], MD;  Location: [place];  Service: Orthopedics;  Laterality: Bilateral;   Social History   Socioeconomic History   Marital status: Married    Spouse name: Not on file   Number of children: Not on file   Years of education: Not on file   Highest education level: Some college, Hamilton degree  Occupational History   Not on file  Tobacco Use   Smoking status: Never   Smokeless tobacco: Never  Vaping Use   Vaping Use: Never used  Substance and Sexual Activity   Alcohol use: Never   Drug use: Never   Sexual activity: Not on file    Comment: vasectomy  Other Topics Concern   Not on file  Social History  Narrative   Not on file   Social Determinants of Health   Financial Resource Strain: Low Risk  (07/18/2022)   Overall Financial Resource Strain (CARDIA)    Difficulty of Paying Living Expenses: Not hard at all  Food Insecurity: Hamilton Food Insecurity (07/18/2022)   Hunger Vital Sign    Worried About Running Out of Food in the Last Year: Never true    [place] in the Last Year: Never true  Transportation Needs: Hamilton Transportation Needs (07/18/2022)   PRAPARE - Hydrologist (Medical): Hamilton    Lack of Transportation (Non-Medical): Hamilton  Physical Activity: Inactive (07/18/2022)   Exercise Vital Sign    Days of Exercise per Week: 0 days    Minutes of Exercise per Session: 0 min  Stress: Hamilton Stress Concern Present (07/18/2022)   [place]    Feeling of Stress : Not at all  Social Connections: [place] (03/22/2022)   Social Connection and Isolation Panel [NHANES]    Frequency of Communication with Friends and Family: More than three times a week    Frequency of Social Gatherings with Friends and Family: More than three times a week    Attends Religious Services: More than 4 times per year    Active Member of [employer] or Organizations: Yes    Attends Music therapist: More than 4 times per year    Marital Status: Married   Allergies  Allergen Reactions   Crestor [Rosuvastatin]     myalgia   Lipitor [Atorvastatin]     myalgia   Nexletol [Bempedoic Acid]     Muscle aches   Family History  Problem Relation Age of Onset   Diverticulitis Mother    Penile cancer Father    Stroke Sister 21       covid, then massive stroke   Prostate cancer Maternal Grandfather    Colon cancer Neg Hx    Colon polyps Neg Hx    Esophageal cancer Neg Hx    Rectal cancer Neg Hx    Stomach cancer Neg Hx     Current Outpatient Medications (Endocrine & Metabolic):    PREDNISONE PO, Take 5 mg by  mouth daily.     Current Outpatient Medications (Hematological):    vitamin B-12 (CYANOCOBALAMIN) 50 MCG tablet, Take 50 mcg by mouth daily.  Current Outpatient Medications (Other):    amoxicillin (AMOXIL) 500 MG tablet, Take 4 tablets by mouth as directed. Prior to dental procedures   Cholecalciferol 125 MCG (5000 UT) capsule, Take 5,000  Units by mouth daily.    cimetidine (CIMETIDINE 200) 200 MG tablet, Take 1 tablet (200 mg total) by mouth 2 (two) times daily as needed. Pt isn't sure of the dosage   Coenzyme Q10 100 MG TABS, Take 1 tablet by mouth daily. Takes 200 mg   DHEA 50 MG TABS, Take 1 tablet by mouth daily.   DULoxetine (CYMBALTA) 20 MG capsule, Take 1 capsule (20 mg total) by mouth daily.   Hyprom-Naphaz-Polysorb-Zn Sulf (CLEAR EYES COMPLETE OP), Apply to eye.   silodosin (RAPAFLO) 8 MG CAPS capsule, Take 8 mg by mouth daily with breakfast.   Reviewed prior external information including notes and imaging from  primary care provider As well as notes that were available from care everywhere and other healthcare systems.  Past medical history, social, surgical and family history all reviewed in electronic medical record.  Hamilton pertanent information unless stated regarding to the chief complaint.   Review of Systems:  Hamilton headache, visual changes, nausea, vomiting, diarrhea, constipation, dizziness, abdominal pain, skin rash, fevers, chills, night sweats, weight loss, swollen lymph nodes, body aches, joint swelling, chest pain, shortness of breath, mood changes. POSITIVE muscle aches  Objective  Blood pressure 128/72, pulse (!) 53, height '5\' 10"'$  (1.778 m), weight 208 lb (94.3 kg), SpO2 96 %.   General: Hamilton apparent distress alert and oriented x3 mood and affect normal, dressed appropriately.  HEENT: Pupils equal, extraocular movements intact  Respiratory: Patient's speak in full sentences and does not appear short of breath  Cardiovascular: Hamilton lower extremity edema, non tender,  Hamilton erythema  Patient is tender to palpation in multiple areas of the back, and extremities.  Patient does have mild weakness of the shoulders bilaterally.  Right wrist though has the most limited range of motion noted.  Limited muscular skeletal ultrasound was performed and interpreted by Hulan Saas, M  Limited ultrasound study shows the patient does have hypoechoic changes and chronic changes noted over the TFCC.  Increasing Doppler flow. Impression: T FCC injury with hypoechoic changes consistent with effusion   Procedure: Real-time Ultrasound Guided Injection of right TFCC Device: GE Logiq Q7 Ultrasound guided injection is preferred based studies that show increased duration, increased effect, greater accuracy, decreased procedural pain, increased response rate, and decreased cost with ultrasound guided versus blind injection.  Verbal informed consent obtained.  Time-out conducted.  Noted Hamilton overlying erythema, induration, or other signs of local infection.  Skin prepped in a sterile fashion.  Local anesthesia: Topical Ethyl chloride.  With sterile technique and under real time ultrasound guidance: With a 25-gauge half inch needle injecting 0.5 cc of 0.5% Marcaine and 0.5 cc of Kenalog 40 mg/mL.  Hamilton blood loss.  Band-Aid placed.  Postinjection instructions given Completed without difficulty  Pain immediately resolved suggesting accurate placement of the medication.  Advised to call if fevers/chills, erythema, induration, drainage, or persistent bleeding.  Impression: Technically successful ultrasound guided injection.    Impression and Recommendations:    The above documentation has been reviewed and is accurate and complete Lyndal Pulley, DO

## 2022-09-22 ENCOUNTER — Encounter: Payer: Self-pay | Admitting: Pharmacist

## 2022-09-22 ENCOUNTER — Ambulatory Visit: Payer: Self-pay

## 2022-09-22 ENCOUNTER — Encounter: Payer: Self-pay | Admitting: Family Medicine

## 2022-09-22 ENCOUNTER — Ambulatory Visit (INDEPENDENT_AMBULATORY_CARE_PROVIDER_SITE_OTHER): Payer: Medicare Other | Admitting: Family Medicine

## 2022-09-22 VITALS — BP 128/72 | HR 53 | Ht 70.0 in | Wt 208.0 lb

## 2022-09-22 DIAGNOSIS — M25531 Pain in right wrist: Secondary | ICD-10-CM

## 2022-09-22 DIAGNOSIS — S6981XA Other specified injuries of right wrist, hand and finger(s), initial encounter: Secondary | ICD-10-CM

## 2022-09-22 DIAGNOSIS — M791 Myalgia, unspecified site: Secondary | ICD-10-CM | POA: Diagnosis not present

## 2022-09-22 DIAGNOSIS — M25532 Pain in left wrist: Secondary | ICD-10-CM | POA: Diagnosis not present

## 2022-09-22 MED ORDER — DULOXETINE HCL 20 MG PO CPEP: 20.0000 mg | ORAL_CAPSULE | Freq: Every day | ORAL | 0 refills | Status: DC

## 2022-09-22 NOTE — Assessment & Plan Note (Signed)
Continues to have myalgia, work-up including laboratory work-up that was reviewed again today did not show any specific findings at this moment.  We discussed potential different medications and patient was willing to try Cymbalta.  Warned of potential side effects and will monitor.  Hopefully will make some improvement with some of patient's chronic difficulties.  Follow-up with me again in 6 to 8 weeks.  May need to titrate up depending on how patient responds.

## 2022-09-22 NOTE — Patient Instructions (Addendum)
Injected wrist today Cymbalta '20mg'$  called in: start after surgery See me again in 6 weeks

## 2022-09-22 NOTE — Assessment & Plan Note (Signed)
Patient given injection today.  Was having some limited range of motion and some of it could be secondary to the hypoechoic changes noted in swelling noted on ultrasound today.  We discussed with patient about icing regimen, home exercises, which activities to do and which ones to avoid.  Potential bracing.  Follow-up again in 6 to 8 weeks otherwise.

## 2022-09-28 ENCOUNTER — Ambulatory Visit (HOSPITAL_BASED_OUTPATIENT_CLINIC_OR_DEPARTMENT_OTHER): Payer: Medicare Other | Admitting: Certified Registered Nurse Anesthetist

## 2022-09-28 ENCOUNTER — Ambulatory Visit (HOSPITAL_BASED_OUTPATIENT_CLINIC_OR_DEPARTMENT_OTHER)
Admission: RE | Admit: 2022-09-28 | Discharge: 2022-09-28 | Disposition: A | Discharge status: Home / Self Care | Payer: Medicare Other | Attending: Urology | Admitting: Urology

## 2022-09-28 ENCOUNTER — Encounter (HOSPITAL_BASED_OUTPATIENT_CLINIC_OR_DEPARTMENT_OTHER)
Admission: RE | Disposition: A | Discharge status: Home / Self Care | Payer: Self-pay | Source: Home / Self Care | Attending: Urology

## 2022-09-28 ENCOUNTER — Encounter (HOSPITAL_BASED_OUTPATIENT_CLINIC_OR_DEPARTMENT_OTHER): Payer: Self-pay | Admitting: Urology

## 2022-09-28 ENCOUNTER — Other Ambulatory Visit: Payer: Self-pay

## 2022-09-28 DIAGNOSIS — N210 Calculus in bladder: Secondary | ICD-10-CM | POA: Insufficient documentation

## 2022-09-28 DIAGNOSIS — N401 Enlarged prostate with lower urinary tract symptoms: Secondary | ICD-10-CM | POA: Insufficient documentation

## 2022-09-28 DIAGNOSIS — Z8546 Personal history of malignant neoplasm of prostate: Secondary | ICD-10-CM | POA: Diagnosis not present

## 2022-09-28 DIAGNOSIS — Z01818 Encounter for other preprocedural examination: Secondary | ICD-10-CM

## 2022-09-28 DIAGNOSIS — N3289 Other specified disorders of bladder: Secondary | ICD-10-CM | POA: Diagnosis not present

## 2022-09-28 DIAGNOSIS — R310 Gross hematuria: Secondary | ICD-10-CM | POA: Insufficient documentation

## 2022-09-28 HISTORY — PX: CYSTOSCOPY WITH LITHOLAPAXY: SHX1425

## 2022-09-28 HISTORY — DX: Myalgia, other site: M79.18

## 2022-09-28 HISTORY — DX: Personal history of other diseases of the nervous system and sense organs: Z86.69

## 2022-09-28 HISTORY — DX: Presence of dental prosthetic device (complete) (partial): Z97.2

## 2022-09-28 HISTORY — DX: Mixed hyperlipidemia: E78.2

## 2022-09-28 HISTORY — DX: Thoracic aortic ectasia: I77.810

## 2022-09-28 HISTORY — DX: Atherosclerotic heart disease of native coronary artery without angina pectoris: I25.10

## 2022-09-28 HISTORY — DX: Presence of spectacles and contact lenses: Z97.3

## 2022-09-28 HISTORY — DX: Prediabetes: R73.03

## 2022-09-28 HISTORY — DX: Calculus in bladder: N21.0

## 2022-09-28 HISTORY — DX: Occlusion and stenosis of bilateral carotid arteries: I65.23

## 2022-09-28 HISTORY — DX: Personal history of malignant neoplasm of prostate: Z85.46

## 2022-09-28 HISTORY — DX: Benign prostatic hyperplasia without lower urinary tract symptoms: N40.0

## 2022-09-28 SURGERY — CYSTOSCOPY, WITH BLADDER CALCULUS LITHOLAPAXY
Anesthesia: General | Site: Bladder

## 2022-09-28 MED ORDER — OXYCODONE HCL 5 MG PO TABS: 5.0000 mg | ORAL_TABLET | Freq: Once | ORAL | Status: DC | PRN

## 2022-09-28 MED ORDER — LACTATED RINGERS IV SOLN: INTRAVENOUS | Status: DC

## 2022-09-28 MED ORDER — MIDAZOLAM HCL 2 MG/2ML IJ SOLN
INTRAMUSCULAR | Status: DC | PRN
  Administered 2022-09-28: 1 mg via INTRAVENOUS

## 2022-09-28 MED ORDER — FENTANYL CITRATE (PF) 100 MCG/2ML IJ SOLN: 25.0000 ug | INTRAMUSCULAR | Status: DC | PRN

## 2022-09-28 MED ORDER — ONDANSETRON HCL 4 MG/2ML IJ SOLN: 4.0000 mg | Freq: Once | INTRAMUSCULAR | Status: DC | PRN

## 2022-09-28 MED ORDER — ACETAMINOPHEN 160 MG/5ML PO SOLN: 325.0000 mg | ORAL | Status: DC | PRN

## 2022-09-28 MED ORDER — OXYCODONE HCL 5 MG/5ML PO SOLN: 5.0000 mg | Freq: Once | ORAL | Status: DC | PRN

## 2022-09-28 MED ORDER — FENTANYL CITRATE (PF) 100 MCG/2ML IJ SOLN
INTRAMUSCULAR | Status: AC
  Filled 2022-09-28: qty 2

## 2022-09-28 MED ORDER — ACETAMINOPHEN 325 MG PO TABS: 325.0000 mg | ORAL_TABLET | ORAL | Status: DC | PRN

## 2022-09-28 MED ORDER — ONDANSETRON HCL 4 MG/2ML IJ SOLN
INTRAMUSCULAR | Status: DC | PRN
  Administered 2022-09-28: 4 mg via INTRAVENOUS

## 2022-09-28 MED ORDER — KETOROLAC TROMETHAMINE 30 MG/ML IJ SOLN: 30.0000 mg | Freq: Once | INTRAMUSCULAR | Status: DC | PRN

## 2022-09-28 MED ORDER — MEPERIDINE HCL 25 MG/ML IJ SOLN: 6.2500 mg | INTRAMUSCULAR | Status: DC | PRN

## 2022-09-28 MED ORDER — FENTANYL CITRATE (PF) 250 MCG/5ML IJ SOLN
INTRAMUSCULAR | Status: DC | PRN
  Administered 2022-09-28: 50 ug via INTRAVENOUS
  Administered 2022-09-28: 25 ug via INTRAVENOUS

## 2022-09-28 MED ORDER — OXYBUTYNIN CHLORIDE 5 MG PO TABS: 5.0000 mg | ORAL_TABLET | Freq: Three times a day (TID) | ORAL | 1 refills | Status: DC | PRN

## 2022-09-28 MED ORDER — CEFAZOLIN SODIUM-DEXTROSE 2-4 GM/100ML-% IV SOLN
2.0000 g | Freq: Once | INTRAVENOUS | Status: AC
  Administered 2022-09-28: 2 g via INTRAVENOUS

## 2022-09-28 MED ORDER — PHENAZOPYRIDINE HCL 200 MG PO TABS: 200.0000 mg | ORAL_TABLET | Freq: Three times a day (TID) | ORAL | 0 refills | Status: DC | PRN

## 2022-09-28 MED ORDER — CEPHALEXIN 500 MG PO CAPS: 500.0000 mg | ORAL_CAPSULE | Freq: Two times a day (BID) | ORAL | 0 refills | Status: AC

## 2022-09-28 MED ORDER — PROPOFOL 10 MG/ML IV BOLUS
INTRAVENOUS | Status: DC | PRN
  Administered 2022-09-28: 200 mg via INTRAVENOUS

## 2022-09-28 MED ORDER — EPHEDRINE SULFATE (PRESSORS) 50 MG/ML IJ SOLN
INTRAMUSCULAR | Status: DC | PRN
  Administered 2022-09-28 (×2): 5 mg via INTRAVENOUS

## 2022-09-28 MED ORDER — DEXAMETHASONE SODIUM PHOSPHATE 10 MG/ML IJ SOLN
INTRAMUSCULAR | Status: DC | PRN
  Administered 2022-09-28: 5 mg via INTRAVENOUS

## 2022-09-28 MED ORDER — CEFAZOLIN SODIUM-DEXTROSE 2-4 GM/100ML-% IV SOLN
INTRAVENOUS | Status: AC
  Filled 2022-09-28: qty 100

## 2022-09-28 MED ORDER — PROPOFOL 10 MG/ML IV BOLUS
INTRAVENOUS | Status: AC
  Filled 2022-09-28: qty 20

## 2022-09-28 MED ORDER — LIDOCAINE 2% (20 MG/ML) 5 ML SYRINGE
INTRAMUSCULAR | Status: DC | PRN
  Administered 2022-09-28: 100 mg via INTRAVENOUS

## 2022-09-28 MED ORDER — MIDAZOLAM HCL 2 MG/2ML IJ SOLN
INTRAMUSCULAR | Status: AC
  Filled 2022-09-28: qty 2

## 2022-09-28 MED ORDER — STERILE WATER FOR IRRIGATION IR SOLN
Status: DC | PRN
  Administered 2022-09-28 (×2): 3000 mL via INTRAVESICAL
  Administered 2022-09-28: 6000 mL via INTRAVESICAL

## 2022-09-28 SURGICAL SUPPLY — 21 items
BAG DRN RND TRDRP ANRFLXCHMBR (UROLOGICAL SUPPLIES) ×1
BAG URINE DRAIN 2000ML AR STRL (UROLOGICAL SUPPLIES) IMPLANT
BALLN NEPHROSTOMY (BALLOONS) ×1
BALLOON NEPHROSTOMY (BALLOONS) ×1 IMPLANT
CATH FOLEY 2WAY SLVR  5CC 18FR (CATHETERS) ×1
CATH FOLEY 2WAY SLVR 5CC 18FR (CATHETERS) IMPLANT
CLOTH BEACON ORANGE TIMEOUT ST (SAFETY) ×1 IMPLANT
ELECT REM PT RETURN 9FT ADLT (ELECTROSURGICAL) ×1
ELECTRODE REM PT RTRN 9FT ADLT (ELECTROSURGICAL) IMPLANT
FIBER LASER FLEXIVA 1000 (UROLOGICAL SUPPLIES) ×1 IMPLANT
FIBER LASER FLEXIVA 550 (UROLOGICAL SUPPLIES) ×1 IMPLANT
GLOVE BIO SURGEON STRL SZ7.5 (GLOVE) ×1 IMPLANT
GOWN STRL REUS W/TWL XL LVL3 (GOWN DISPOSABLE) ×1 IMPLANT
HOLDER FOLEY CATH W/STRAP (MISCELLANEOUS) IMPLANT
KIT TURNOVER CYSTO (KITS) ×1 IMPLANT
MANIFOLD NEPTUNE II (INSTRUMENTS) ×1 IMPLANT
PACK CYSTO (CUSTOM PROCEDURE TRAY) ×1 IMPLANT
SYR TOOMEY IRRIG 70ML (MISCELLANEOUS) ×1
SYRINGE TOOMEY IRRIG 70ML (MISCELLANEOUS) IMPLANT
TUBE CONNECTING 12X1/4 (SUCTIONS) IMPLANT
WATER STERILE IRR 3000ML UROMA (IV SOLUTION) ×1 IMPLANT

## 2022-09-28 NOTE — Anesthesia Preprocedure Evaluation (Signed)
Anesthesia Evaluation  Patient identified by MRN, date of birth, ID band Patient awake    Reviewed: Allergy & Precautions, NPO status , Patient's Chart, lab work & pertinent test results  History of Anesthesia Complications (+) PONV and history of anesthetic complications  Airway Mallampati: II  TM Distance: >3 FB Neck ROM: Full    Dental  (+) Partial Upper, Partial Lower, Dental Advisory Given   Pulmonary neg pulmonary ROS,    Pulmonary exam normal        Cardiovascular Normal cardiovascular exam  IMPRESSIONS     1. Left ventricular ejection fraction, by estimation, is 60 to 65%. The left ventricle has normal function. The left ventricle has no regional wall motion abnormalities. There is mild left ventricular hypertrophy. Left ventricular diastolic parameters  were normal. 2. Right ventricular systolic function is normal. The right ventricular size is normal. There is mildly elevated pulmonary artery systolic pressure. The estimated right ventricular systolic pressure is 08.8 mmHg. 3. The mitral valve is normal in structure. Trivial mitral valve regurgitation. 4. The aortic valve is tricuspid. Aortic valve regurgitation is trivial. Mild aortic valve sclerosis is present, with no evidence of aortic valve stenosis. 5. The inferior vena cava is normal in size with <50% respiratory variability, suggesting right atrial pressure of 8 mmHg.    Neuro/Psych negative psych ROS   GI/Hepatic Neg liver ROS, GERD  Medicated,  Endo/Other  negative endocrine ROS  Renal/GU negative Renal ROS     Musculoskeletal  (+) Arthritis , Osteoarthritis,    Abdominal Normal abdominal exam  (+)   Peds  Hematology negative hematology ROS (+)   Anesthesia Other Findings Day of surgery medications reviewed with the patient.  Reproductive/Obstetrics                             Anesthesia Physical  Anesthesia  Plan  ASA: II  Anesthesia Plan: General   Post-op Pain Management:  Regional for Post-op pain   Induction: Intravenous  PONV Risk Score and Plan: 4 or greater and Ondansetron, Propofol infusion, Midazolam and Dexamethasone  Airway Management Planned: LMA  Additional Equipment: None  Intra-op Plan:   Post-operative Plan: Extubation in OR  Informed Consent: I have reviewed the patients History and Physical, chart, labs and discussed the procedure including the risks, benefits and alternatives for the proposed anesthesia with the patient or authorized representative who has indicated his/her understanding and acceptance.     Dental advisory given  Plan Discussed with: CRNA  Anesthesia Plan Comments:         Anesthesia Quick Evaluation

## 2022-09-28 NOTE — Discharge Instructions (Signed)

## 2022-09-28 NOTE — Op Note (Signed)
Operative Note  Preoperative diagnosis:  1.  2.5 cm bladder stone 2.  BPH with LUTS 3.  Hematuria  Postoperative diagnosis: 1.  2.5 cm bladder stone 2.  BPH with LUTS 3.  Hematuria  Procedure(s): 1.  Cystolitholapaxy of 2.5 cm bladder stone 2.  Bilateral retrograde pyelograms with intraoperative interpretation of fluoroscopic imaging 3.  Bladder neck fulguration  Surgeon: [name], MD  Assistants:  None  Anesthesia:  General  Complications:  None  EBL: 10 mL  Specimens: 1.  Blood stone fragments  Drains/Catheters: 1.  18 French Foley catheter  Intraoperative findings:   2.5 cm bladder stone Trilobar prostatic urethral obstruction Moderate bladder trabeculation Bleeding from the bladder neck following cystolitholapaxy that required Bugbee fulguration Solitary left collecting system with no filling defects or dilation involving the left ureter or left renal pelvis seen on retrograde pyelogram Solitary right collecting system with no filling defects or dilation involving the right ureter or right renal pelvis seen on retrograde pyelogram   Indication:  Tristan Hamilton is a 71 y.o. male with a history of prostate cancer, BPH with LUTS, gross hematuria and a 2.5 cm bladder stone.  He has been consented for the above procedures, voices understanding and wishes to proceed.  Description of procedure:  After informed consent was obtained, the patient was brought to the operating room and general LMA anesthesia was administered. The patient was then placed in the dorsolithotomy position and prepped and draped in the usual sterile fashion. A timeout was performed. A 23 French rigid cystoscope with a continuous-flow sheath was then inserted into the urethral meatus and advanced into the bladder under direct vision. A complete bladder survey revealed findings listed above.  A 1000 W holmium laser was then used to fracture the bladder stone into innumerable smaller pieces.   All stone fragments were then siphoned out of the lumen of the bladder through the sheath of the cystoscope.  Following the evacuation of all bladder stone fragments, there was circumferential bleeding from the bladder neck from his enlarged prostate.  Bugbee electrocautery was then used to extensively fulgurated the bladder neck until hemostasis was achieved.  A 5 French ureteral catheter was then inserted into the left ureteral orifice and a retrograde pyelogram was obtained, with the findings listed above.  The catheter was then removed.  A 5 French ureteral catheter was then inserted into the right ureteral orifice and a retrograde pyelogram was obtained, with the findings listed above.   The catheter was then removed.    Due to the size of his prostate and the extent of bladder neck fulguration, made the decision to leave a 18 French Foley catheter.  The catheter was inserted atraumatically and extensively hand irrigated and was free of clot and/or debris.  Foley catheter was then placed to gravity drainage.  The patient tolerated the procedure well and was taken to the postanesthesia in stable condition.  Plan: Discharge home with Foley catheter.  Remove Foley catheter at 7 AM tomorrow

## 2022-09-28 NOTE — H&P (Signed)
Urology Preoperative H&P   Chief Complaint: Bladder stone  History of Present Illness: Tristan Hamilton is a 71 y.o. male with with a history of Gleason 6 prostate cancer that was diagnosed in [place], [place] in 2011. 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   1. Prostate cancer- Last PSA: 4.03 (06/2022), 5.75 (12/2021), 4.17 (01/2021), 4.04 (12/2018). No family history of prostate cancer. Prostate volume: 69 cm. His prostate biopsy in 2019 showed Gleason 6 prostate cancer. He had a repeat MRI guided prostate biopsy on 01/31/20 that was NED in all 16 cores. Had a near syncopal episode immediately following his PNBx. He is urinating w/o difficulty and denies dysuria or hematuria.   2. 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   3. Gross hematuria: The patient reports multiple episodes of painless gross hematuria, specifically after mowing his yard (on a riding lawn more) and lifting heavy objects. He is a non-smoker and denies any personal/family history of kidney or bladder cancer. No recent cystoscopy or upper tract imaging.   He underwent cystoscopy on 08/19/22 and was found to have a large bladder stone measuring approximately 2.5 cm.    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 [name];   coronary calcium score CT 07-27-2022 (237)   GERD (gastroesophageal reflux disease)    Heart murmur    History of DVT of lower extremity 1975   LLE  post op knee arthroscopy right side ;   treated with blood thinner,  per pt no clot before or since 1975   History of  kidney stones    History of migraine    h/o of ocular migranes   History of prostate cancer    urologist--- dr [name];  per dr [name] note dx 2011 w/ Gleason 6 in [place] , [place];  active survillance  (09-16-2022  per pt last bx done did not show any cancer)   Hyperlipidemia, mixed    Myalgia, multiple sites    09-16-2022 per pt chronic multiple site ,  intermittant since taking statin medication's,  takes prednisone daily   Osteoarthritis of both knees    Pre-diabetes    Sinus bradycardia seen on cardiac monitor 02/24/2020   lowest heart rate 35 w/ occasional PVCs / PACs   Wears dentures    upper   Wears glasses     Past Surgical History:  Procedure Laterality Date   CYSTOSCOPY WITH INSERTION OF UROLIFT  2012   KNEE ARTHROSCOPY Right 1975   ORCHIECTOMY Left    child   TONSILLECTOMY     child   TOTAL KNEE ARTHROPLASTY Bilateral 03/24/2020   Procedure: TOTAL KNEE BILATERAL;  Surgeon: [name], MD;  Location: [facility];  Service: Orthopedics;  Laterality: Bilateral;    Allergies:  Allergies  Allergen Reactions   Crestor [Rosuvastatin]     myalgia   Lipitor [Atorvastatin]     myalgia   Nexletol [Bempedoic Acid]     Muscle aches    Family History  Problem Relation Age of Onset  Diverticulitis Mother    Penile cancer Father    Stroke Sister 55       covid, then massive stroke   Prostate cancer Maternal Grandfather    Colon cancer Neg Hx    Colon polyps Neg Hx    Esophageal cancer Neg Hx    Rectal cancer Neg Hx    Stomach cancer Neg Hx     Social History:  reports that he has never smoked. He has never used smokeless tobacco. He reports that he does not drink alcohol and does not use drugs.  ROS: A complete review of systems was performed.  All systems are negative except for pertinent findings as noted.  Physical Exam:  Vital signs in last 24 hours:   Constitutional:  Alert and oriented, No acute distress  Laboratory Data:  No results for  input(s): "WBC", "HGB", "HCT", "PLT" in the last 72 hours.  No results for input(s): "NA", "K", "CL", "GLUCOSE", "BUN", "CALCIUM", "CREATININE" in the last 72 hours.  Invalid input(s): "CO3"   No results found for this or any previous visit (from the past 24 hour(s)). No results found for this or any previous visit (from the past 240 hour(s)).  Renal Function: No results for input(s): "CREATININE" in the last 168 hours. CrCl cannot be calculated (Patient's most recent lab result is older than the maximum 21 days allowed.).  Radiologic Imaging: No results found.  I independently reviewed the above imaging studies.  Assessment and Plan Tristan Hamilton is a 71 y.o. male with a 2.5 cm bladder stone and hematuria   -Cystoscopy revealed a 2.5 cm bladder stone along with trilobar prostatic urethral obstruction.  -The risk, benefits and alternatives of cystolitholapaxy and bilateral retrograde pyelograms was discussed in detail. I had a lengthy discussion with the patient concerning the option of proceeding with a TURP, but he would like to think about his options before proceeding with that surgery. He will get back to me with his ultimate decision.    [name], MD 09/28/2022, 7:21 AM  [facility] Pager: [phone]

## 2022-09-28 NOTE — Transfer of Care (Signed)
Immediate Anesthesia Transfer of Care Note  Patient: Tristan Hamilton  Procedure(s) Performed: CYSTOSCOPY WITH LITHOLAPAXY/ BILATERAL RETROGRADE PYELOGRAM (Bladder)  Patient Location: PACU  Anesthesia Type:General  Level of Consciousness: drowsy and patient cooperative  Airway & Oxygen Therapy: Patient Spontanous Breathing  Post-op Assessment: Report given to RN and Post -op Vital signs reviewed and stable  Post vital signs: Reviewed and stable  Last Vitals:  Vitals Value Taken Time  BP 125/82 09/28/22 1027  Temp    Pulse 58 09/28/22 1027  Resp 14 09/28/22 1027  SpO2 92 % 09/28/22 1027  Vitals shown include unvalidated device data.  Last Pain:  Vitals:   09/28/22 0755  TempSrc: Oral  PainSc: 0-No pain      Patients Stated Pain Goal: 4 (45/84/83 5075)  Complications: No notable events documented.

## 2022-09-28 NOTE — Anesthesia Postprocedure Evaluation (Signed)
Anesthesia Post Note  Patient: Tristan Hamilton  Procedure(s) Performed: CYSTOSCOPY WITH LITHOLAPAXY/ BILATERAL RETROGRADE PYELOGRAM (Bladder)     Patient location during evaluation: Phase II Anesthesia Type: General Level of consciousness: awake Pain management: pain level controlled Vital Signs Assessment: post-procedure vital signs reviewed and stable Respiratory status: spontaneous breathing Cardiovascular status: stable Postop Assessment: no apparent nausea or vomiting Anesthetic complications: no   No notable events documented.  Last Vitals:  Vitals:   09/28/22 1105 09/28/22 1106  BP:    Pulse: 64 64  Resp: 13 16  Temp:  36.4 C  SpO2: 95% 94%    Last Pain:  Vitals:   09/28/22 1106  TempSrc:   PainSc: 0-No pain                 [name]

## 2022-09-28 NOTE — Anesthesia Procedure Notes (Signed)
Procedure Name: LMA Insertion Date/Time: 09/28/2022 9:24 AM  Performed by: Clearnce Sorrel, CRNAPre-anesthesia Checklist: Patient identified, Emergency Drugs available, Suction available and Patient being monitored Patient Re-evaluated:Patient Re-evaluated prior to induction Oxygen Delivery Method: Circle System Utilized Preoxygenation: Pre-oxygenation with 100% oxygen Induction Type: IV induction Ventilation: Mask ventilation without difficulty LMA: LMA inserted LMA Size: 4.0 Number of attempts: 1 Airway Equipment and Method: Bite block Placement Confirmation: positive ETCO2 Tube secured with: Tape Dental Injury: Teeth and Oropharynx as per pre-operative assessment

## 2022-09-30 ENCOUNTER — Encounter (HOSPITAL_BASED_OUTPATIENT_CLINIC_OR_DEPARTMENT_OTHER): Payer: Self-pay | Admitting: Urology

## 2022-10-01 ENCOUNTER — Encounter (HOSPITAL_COMMUNITY): Payer: Self-pay

## 2022-10-01 ENCOUNTER — Other Ambulatory Visit: Payer: Self-pay

## 2022-10-01 ENCOUNTER — Emergency Department (HOSPITAL_COMMUNITY)
Admission: EM | Admit: 2022-10-01 | Discharge: 2022-10-01 | Disposition: A | Discharge status: Home / Self Care | Payer: Medicare Other | Attending: Emergency Medicine | Admitting: Emergency Medicine

## 2022-10-01 DIAGNOSIS — D72829 Elevated white blood cell count, unspecified: Secondary | ICD-10-CM | POA: Insufficient documentation

## 2022-10-01 DIAGNOSIS — Z79899 Other long term (current) drug therapy: Secondary | ICD-10-CM | POA: Insufficient documentation

## 2022-10-01 DIAGNOSIS — Z8546 Personal history of malignant neoplasm of prostate: Secondary | ICD-10-CM | POA: Diagnosis not present

## 2022-10-01 DIAGNOSIS — R339 Retention of urine, unspecified: Secondary | ICD-10-CM

## 2022-10-01 HISTORY — DX: Retention of urine, unspecified: R33.9

## 2022-10-01 LAB — URINALYSIS, ROUTINE W REFLEX MICROSCOPIC
Bacteria, UA: NONE SEEN
Bilirubin Urine: NEGATIVE
Glucose, UA: NEGATIVE mg/dL
Ketones, ur: NEGATIVE mg/dL
Leukocytes,Ua: NEGATIVE
Nitrite: POSITIVE — AB
Protein, ur: 30 mg/dL — AB
Specific Gravity, Urine: 1.011 (ref 1.005–1.030)
pH: 6 (ref 5.0–8.0)

## 2022-10-01 LAB — CBC
HCT: 46.7 % (ref 39.0–52.0)
Hemoglobin: 16.6 g/dL (ref 13.0–17.0)
MCH: 32.7 pg (ref 26.0–34.0)
MCHC: 35.5 g/dL (ref 30.0–36.0)
MCV: 91.9 fL (ref 80.0–100.0)
Platelets: 210 10*3/uL (ref 150–400)
RBC: 5.08 MIL/uL (ref 4.22–5.81)
RDW: 12 % (ref 11.5–15.5)
WBC: 10.8 10*3/uL — ABNORMAL HIGH (ref 4.0–10.5)
nRBC: 0 % (ref 0.0–0.2)

## 2022-10-01 LAB — BASIC METABOLIC PANEL
Anion gap: 11 (ref 5–15)
BUN: 13 mg/dL (ref 8–23)
CO2: 22 mmol/L (ref 22–32)
Calcium: 9.4 mg/dL (ref 8.9–10.3)
Chloride: 101 mmol/L (ref 98–111)
Creatinine, Ser: 0.96 mg/dL (ref 0.61–1.24)
GFR, Estimated: >60 mL/min (ref >60)
Glucose, Bld: 137 mg/dL — ABNORMAL HIGH (ref 70–99)
Potassium: 3.8 mmol/L (ref 3.5–5.1)
Sodium: 134 mmol/L — ABNORMAL LOW (ref 135–145)

## 2022-10-01 NOTE — ED Provider Notes (Signed)
[facility] EMERGENCY DEPARTMENT Provider Note   CSN: [id number] Arrival date & time: 10/01/22  1741     History  Chief Complaint  Patient presents with   Urinary Retention    Tristan Hamilton is a 71 y.o. male with a past medical history significant for prostate cancer, enlarged prostate, hyperlipidemia, and prediabetes who presents to the ED due to urinary retention.  Patient had a cystoscopy with litholapaxy on 10/11 by Dr. Lovena Neighbours with urology. He notes yesterday he was having frequent urination with only small amounts.  Patient called urology who placed him on an antibiotic.  Patient unsure which antibiotic.  Patient then woke up this morning had a small amount of urination and then went numerous hours without any urination.  Patient admits to suprapubic pain.  No fever or chills.  Denies back pain. Foley placed in triage, so during initial evaluation suprapubic pain has resolved.  History obtained from patient and past medical records. No interpreter used during encounter.       Home Medications Prior to Admission medications   Medication Sig Start Date End Date Taking? Authorizing Provider  amoxicillin (AMOXIL) 500 MG tablet Take 4 tablets by mouth as directed. Prior to dental procedures 08/19/21   [provider]  cephALEXin (KEFLEX) 500 MG capsule Take 1 capsule (500 mg total) by mouth 2 (two) times daily for 3 days. 09/28/22 10/01/22  Ceasar Mons, MD  Cholecalciferol 125 MCG (5000 UT) capsule Take 5,000 Units by mouth daily.     [provider]  cimetidine (CIMETIDINE 200) 200 MG tablet Take 1 tablet (200 mg total) by mouth 2 (two) times daily as needed. Pt isn't sure of the dosage 04/21/22   Koberlein, Andris Flurry C, MD  Coenzyme Q10 100 MG TABS Take 1 tablet by mouth daily. Takes 200 mg    [provider]  DHEA 50 MG TABS Take 1 tablet by mouth daily.    [provider]  DULoxetine (CYMBALTA) 20 MG capsule Take 1  capsule (20 mg total) by mouth daily. Patient taking differently: Take 20 mg by mouth daily. Has not started yet 09/22/22   Lyndal Pulley, DO  Hyprom-Naphaz-Polysorb-Zn Sulf (CLEAR EYES COMPLETE OP) Apply to eye.    [provider]  oxybutynin (DITROPAN) 5 MG tablet Take 1 tablet (5 mg total) by mouth every 8 (eight) hours as needed for bladder spasms. 09/28/22   Ceasar Mons, MD  phenazopyridine (PYRIDIUM) 200 MG tablet Take 1 tablet (200 mg total) by mouth 3 (three) times daily as needed (for pain with urination). 09/28/22 09/28/23  Ceasar Mons, MD  PREDNISONE PO Take 5 mg by mouth daily.    [provider]  silodosin (RAPAFLO) 8 MG CAPS capsule Take 8 mg by mouth daily with breakfast.    [provider]  vitamin B-12 (CYANOCOBALAMIN) 50 MCG tablet Take 50 mcg by mouth daily.    [provider]      Allergies    Crestor [rosuvastatin], Lipitor [atorvastatin], and Nexletol [bempedoic acid]    Review of Systems   Review of Systems  Constitutional:  Negative for chills and fever.  Gastrointestinal:  Positive for abdominal pain. Negative for diarrhea, nausea and vomiting.  Genitourinary:  Positive for difficulty urinating and dysuria.  Musculoskeletal:  Negative for back pain.  All other systems reviewed and are negative.   Physical Exam Updated Vital Signs BP (!) 151/95 (BP Location: Right Arm)   Pulse 65   Temp 98.2  F (36.8 C) (Oral)   Resp 16   Ht '5\' 10"'$  (1.778 m)   Wt 92.1 kg   SpO2 95%   BMI 29.13 kg/m  Physical Exam Vitals and nursing note reviewed.  Constitutional:      General: He is not in acute distress.    Appearance: He is not ill-appearing.  HENT:     Head: Normocephalic.  Eyes:     Pupils: Pupils are equal, round, and reactive to light.  Cardiovascular:     Rate and Rhythm: Normal rate and regular rhythm.     Pulses: Normal pulses.     Heart sounds: Normal heart sounds. No murmur heard.    No  friction rub. No gallop.  Pulmonary:     Effort: Pulmonary effort is normal.     Breath sounds: Normal breath sounds.  Abdominal:     General: Abdomen is flat. There is no distension.     Palpations: Abdomen is soft.     Tenderness: There is no abdominal tenderness. There is no guarding or rebound.     Comments: Foley in place with dark urine in foley bag.  Musculoskeletal:        General: Normal range of motion.     Cervical back: Neck supple.  Skin:    General: Skin is warm and dry.  Neurological:     General: No focal deficit present.     Mental Status: He is alert.  Psychiatric:        Mood and Affect: Mood normal.        Behavior: Behavior normal.     ED Results / Procedures / Treatments   Labs (all labs ordered are listed, but only abnormal results are displayed) Labs Reviewed  URINALYSIS, ROUTINE W REFLEX MICROSCOPIC - Abnormal; Notable for the following components:      Result Value   Color, Urine AMBER (*)    Hgb urine dipstick MODERATE (*)    Protein, ur 30 (*)    Nitrite POSITIVE (*)    All other components within normal limits  BASIC METABOLIC PANEL - Abnormal; Notable for the following components:   Sodium 134 (*)    Glucose, Bld 137 (*)    All other components within normal limits  CBC - Abnormal; Notable for the following components:   WBC 10.8 (*)    All other components within normal limits  URINE CULTURE    EKG None  Radiology No results found.  Procedures Procedures    Medications Ordered in ED Medications - No data to display  ED Course/ Medical Decision Making/ A&P                           Medical Decision Making Amount and/or Complexity of Data Reviewed Independent Historian: spouse External Data Reviewed: notes.    Details: Urology surgery encounter Labs: ordered. Decision-making details documented in ED Course.   71 year old male presents the ED due to urinary retention and suprapubic pain. History of enlarged prostate and  prostate cancer. Patient had a recent cystoscopy with litholapaxy on 10/11 by Dr. Lovena Neighbours with urology.  Yesterday he was having frequent urination and was placed on an antibiotic by urology however, patient is unsure which one.  No fever or chills.  Upon arrival, stable vitals.  Patient in no acute distress.  Prior to my initial evaluation, Foley placed in triage where 1L of urine was removed per patient.  During initial evaluation, patient admits  to resolution in symptoms.  Routine labs and UA ordered at triage.  Abdomen soft, nondistended, nontender.  No suprapubic tenderness. No evidence of sepsis.  UA significant for positive nitrites.  No bacteria or leukocytes.  Patient is currently on Pyridium which is most likely why patient has nitrites.  Urine culture pending.  Advised patient to continue taking his antibiotic as previously prescribed.  CBC significant for mild leukocytosis at 10.8.  Normal hemoglobin.  BMP reassuring.  Normal renal function.  Given symptoms have resolved after foley placement, feel patient is stable for discharge.  Patient discharged with foley in place. Advised patient to call urologist on Monday to schedule an appointment for further evaluation.Strict ED precautions discussed with patient. Patient states understanding and agrees to plan. Patient discharged home in no acute distress and stable vitals  Dicussed with Dr. Regenia Skeeter who agrees with assessment and plan.       Final Clinical Impression(s) / ED Diagnoses Final diagnoses:  Urinary retention    Rx / DC Orders ED Discharge Orders     None         Karie Kirks 10/01/22 2018    Sherwood Gambler, MD 10/04/22 [phone]

## 2022-10-01 NOTE — Discharge Instructions (Addendum)
It was a pleasure taking care of you today. As discussed, all of your labs were reassuring. Urine culture is pending. Continue taking your antibiotic as previously prescribed.  Please call your urologist on Monday to schedule an appointment for further evaluation.  Keep Foley in until evaluated by urology.  Return to the ER for new or worsening symptoms.

## 2022-10-01 NOTE — ED Triage Notes (Signed)
Pt reports urinary retention, last urinated 10/12. He had a bladder stone removed on 10/11 and had the foley catheter was removed on 11/12. He reports severe lower abd pain.

## 2022-10-03 LAB — URINE CULTURE: Culture: NO GROWTH

## 2022-10-07 ENCOUNTER — Telehealth: Payer: Self-pay | Admitting: Licensed Clinical Social Worker

## 2022-10-11 NOTE — Patient Outreach (Signed)
  Care Coordination   Initial Visit Note   10/11/2022 Name: Tristan Hamilton MRN: [id number] DOB: [birth year]  Tristan Hamilton is a 71 y.o. year old male who sees Tristan Conners, MD for primary care. I spoke with  Tristan Hamilton by phone today.  What matters to the patients health and wellness today?  Care Coordination     Goals Addressed             This Visit's Progress    COMPLETED: Care Coordination Activities-No Follow up Required       Care Coordination Interventions: Active listening / Reflection utilized  Pt reports symptom management after Tristan visit Patient has f/up appts scheduled with specialists, as recommended in Tristan discharge summary LCSW informed patient of care coordination services. Pt is not interested at this time and agreed to contact PCP, should needs arise          SDOH assessments and interventions completed:  No     Care Coordination Interventions Activated:  Yes  Care Coordination Interventions:  Yes, provided   Follow up plan: No further intervention required.   Encounter Outcome:  Pt. Refused   Tristan Hamilton, [email or web address], [email or web address] Phone [phone] 9:47 AM

## 2022-10-11 NOTE — Progress Notes (Deleted)
Office Visit Note  Patient: Tristan Hamilton             Date of Birth: 02/02/1951           MRN: [id number]             PCP: Farrel Conners, MD Referring: [name], MD Visit Date: 10/19/2022   Subjective:  No chief complaint on file.   History of Present Illness: Joushua Dugar is a 71 y.o. male here for follow up for muscle pain and bilateral hand pains negative lab workup for myositis or for PMR or RA   Previous HPI 04/18/2022 Faizon Capozzi is a 71 y.o. male here for follow up here for muscle pain and bilateral hand pains negative lab workup for myositis or for PMR or RA. Recommended meloxicam for hand pains possibly OA related but did not help much recommended trying resuming prednisone 5 mg daily. He is doing well on the medication but tried stopping it since 3 days ago to see how much effect and did notice quickly increased pains in his hands and wrists and to lesser extent in shoulders.   Previous HPI 02/14/22 Ancil Dewan is a 71 y.o. male here for follow up for myalgias and hand cramps concerning for possible statin myopathy. CK, aldolase, sed rate, and HMGCR were unremarkable.  He had an improvement with the prednisone taper but started having symptoms again at 5 mg dose and he stopped taking the prednisone completely about 5 days ago and noticed worsening in bilateral shoulder and wrist pain. Symptoms are worse during use with more pain while weight bearing. He felt some improvement with aleve 220 mg as needed.   Previous HPI 01/03/22 Brayson Livesey is a 71 y.o. male here for joint and muscle pains. He started on crestor in September with almost immediate onset of muscle aches in multiple areas including shoulders, hips, hamstrings, and knees bilaterally. This was stopped and tried lipitor with return of same symptoms. Since stopping these he has continued having chronic pain in these areas worst affected now in the shoulders. He hurts about the same throughout the  day. He does not noticed any visible swelling or rashes. He also denies new weakness or numbness anywhere. He has cramping in his hands and sometimes in lateral lower legs but this predates the current problems. He was treated with prednisone tapering doses with very good improvement of symptoms, which remained down to doses of 10 mg/day but return in 2-3 days after stopping.  Lab evaluation was negative for ANA with normal ESR, CRP, CK, and uric acid.   No Rheumatology ROS completed.   PMFS History:  Patient Active Problem List   Diagnosis Date Noted   TFCC (triangular fibrocartilage complex) injury, right, initial encounter 09/22/2022   Prediabetes 07/29/2022   Bilateral carotid artery stenosis 07/06/2022   Ganglion cyst 06/09/2022   Back abscess 06/09/2022   Bilateral wrist pain 02/14/2022   Bilateral shoulder pain 02/14/2022   Myalgia 01/03/2022   Hand cramps 01/03/2022   Statin myopathy 01/03/2022   Hyperlipidemia 11/10/2021   OA (osteoarthritis) of knee 03/24/2020   Status post total bilateral knee replacement 03/24/2020   Acquired renal cyst of right kidney 01/21/2020   Prostate cancer ([place]) 11/01/2019   Enlarged prostate 11/01/2019   Vitamin D deficiency 11/01/2019   Bilateral primary osteoarthritis of knee 09/17/2019    Past Medical History:  Diagnosis Date   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 [name];   coronary calcium score CT 07-27-2022 (237)   GERD (gastroesophageal reflux disease)    Heart murmur    History of DVT of lower extremity 1975   LLE  post op knee arthroscopy right side ;   treated with blood thinner,  per pt no clot before or since 1975   History of kidney stones    History of migraine    h/o of ocular  migranes   History of prostate cancer    urologist--- dr winter;  per dr winter note dx 2011 w/ Gleason 6 in [place] , [place];  active survillance  (09-16-2022  per pt last bx done did not show any cancer)   Hyperlipidemia, mixed    Myalgia, multiple sites    09-16-2022 per pt chronic multiple site ,  intermittant since taking statin medication's,  takes prednisone daily   Osteoarthritis of both knees    Pre-diabetes    Sinus bradycardia seen on cardiac monitor 02/24/2020   lowest heart rate 35 w/ occasional PVCs / PACs   Wears dentures    upper   Wears glasses     Family History  Problem Relation Age of Onset   Diverticulitis Mother    Penile cancer Father    Stroke Sister 56       covid, then massive stroke   Prostate cancer Maternal Grandfather    Colon cancer Neg Hx    Colon polyps Neg Hx    Esophageal cancer Neg Hx    Rectal cancer Neg Hx    Stomach cancer Neg Hx    Past Surgical History:  Procedure Laterality Date   CYSTOSCOPY WITH INSERTION OF UROLIFT  2012   CYSTOSCOPY WITH LITHOLAPAXY N/A 09/28/2022   Procedure: CYSTOSCOPY WITH LITHOLAPAXY/ BILATERAL RETROGRADE PYELOGRAM;  Surgeon: Ceasar Mons, MD;  Location: [facility];  Service: Urology;  Laterality: N/A;   KNEE ARTHROSCOPY Right 1975   ORCHIECTOMY Left    child   TONSILLECTOMY     child   TOTAL KNEE ARTHROPLASTY Bilateral 03/24/2020   Procedure: TOTAL KNEE BILATERAL;  Surgeon: [name], MD;  Location: [facility];  Service: Orthopedics;  Laterality: Bilateral;   Social History   Social History Narrative   Not on file   Immunization History  Administered Date(s) Administered   Fluad Quad(high Dose 65+) 02/05/2020, 10/19/2020   PFIZER(Purple Top)SARS-COV-2 Vaccination 08/21/2020, 09/11/2020   Pneumococcal Polysaccharide-23 02/05/2020   Tdap 01/23/2022   Zoster, Live 10/01/2014     Objective: Vital Signs: There were no vitals taken for this visit.   Physical  Exam   Musculoskeletal Exam: ***  CDAI Exam: CDAI Score: -- Patient Global: --; Provider Global: -- Swollen: --; Tender: -- Joint Exam 10/19/2022   No joint exam has been documented for this visit   There is currently no information documented on the homunculus. Go to the Rheumatology activity and complete the homunculus joint exam.  Investigation: No additional findings.  Imaging: [name] LIMITED JOINT SPACE STRUCTURES UP BILAT(NO LINKED CHARGES)  Result Date: 09/30/2022  Limited muscular skeletal ultrasound was performed and interpreted by Hulan Saas, M Limited ultrasound study shows the patient does have hypoechoic changes and chronic changes noted over the TFCC.  Increasing Doppler flow. Impression: [facility]  injury with hypoechoic changes consistent with effusion   Procedure: Real-time Ultrasound Guided Injection of right TFCC Device: GE Logiq Q7 Ultrasound guided injection is preferred based studies that show increased duration, increased effect, greater accuracy, decreased procedural pain, increased response rate, and decreased cost with ultrasound guided versus blind injection. Verbal informed consent obtained. Time-out conducted. Noted no overlying erythema, induration, or other signs of local infection. Skin prepped in a sterile fashion. Local anesthesia: Topical Ethyl chloride. With sterile technique and under real time ultrasound guidance: With a 25-gauge half inch needle injecting 0.5 cc of 0.5% Marcaine and 0.5 cc of Kenalog 40 mg/mL.  No blood loss.  Band-Aid placed.  Postinjection instructions given Completed without difficulty Pain immediately resolved suggesting accurate placement of the medication. Advised to call if fevers/chills, erythema, induration, drainage, or persistent bleeding. Impression: Technically successful ultrasound guided injection.    Recent Labs: Lab Results  Component Value Date   WBC 10.8 (H) 10/01/2022   HGB 16.6 10/01/2022   PLT 210 10/01/2022   NA  134 (L) 10/01/2022   K 3.8 10/01/2022   CL 101 10/01/2022   CO2 22 10/01/2022   GLUCOSE 137 (H) 10/01/2022   BUN 13 10/01/2022   CREATININE 0.96 10/01/2022   BILITOT 0.9 02/15/2022   ALKPHOS 69 02/15/2022   AST 22 02/15/2022   ALT 23 02/15/2022   PROT 6.7 02/15/2022   ALBUMIN 4.7 02/15/2022   CALCIUM 9.4 10/01/2022   GFRAA >60 03/26/2020    Speciality Comments: No specialty comments available.  Procedures:  No procedures performed Allergies: Crestor [rosuvastatin], Lipitor [atorvastatin], and Nexletol [bempedoic acid]   Assessment / Plan:     Visit Diagnoses: No diagnosis found.  ***  Orders: No orders of the defined types were placed in this encounter.  No orders of the defined types were placed in this encounter.    Follow-Up Instructions: No follow-ups on file.   Bertram Savin, RT  Note - This record has been created using Editor, commissioning.  Chart creation errors have been sought, but may not always  have been located. Such creation errors do not reflect on  the standard of medical care.

## 2022-10-11 NOTE — Patient Instructions (Signed)
Visit Information  Thank you for taking time to visit with me today. Please don't hesitate to contact me if I can be of assistance to you.   Following are the goals we discussed today:   Goals Addressed             This Visit's Progress    COMPLETED: Care Coordination Activities-No Follow up Required       Care Coordination Interventions: Active listening / Reflection utilized  Pt reports symptom management after ED visit Patient has f/up appts scheduled with specialists, as recommended in ED discharge summary LCSW informed patient of care coordination services. Pt is not interested at this time and agreed to contact PCP, should needs arise          If you are experiencing a Mental Health or [place] or need someone to talk to, please call the Suicide and Crisis Lifeline: 988 call 911   Patient verbalizes understanding of instructions and care plan provided today and agrees to view in [place]. Active MyChart status and patient understanding of how to access instructions and care plan via MyChart confirmed with patient.     No further follow up required:    Christa See, MSW, [email or web address] Phone [phone] 9:48 AM

## 2022-10-18 ENCOUNTER — Other Ambulatory Visit: Payer: Self-pay | Admitting: Pharmacist

## 2022-10-18 ENCOUNTER — Telehealth: Payer: Self-pay | Admitting: Cardiology

## 2022-10-18 NOTE — Telephone Encounter (Signed)
Message sent to our Pharm D.

## 2022-10-18 NOTE — Telephone Encounter (Signed)
Will create order set for patient

## 2022-10-18 NOTE — Telephone Encounter (Signed)
Pt states that he would now like to be prescribed medication Leqvio as discussed with Dr. Gardiner Rhyme. Pt states that his insurance will cover it. Please advise

## 2022-10-19 ENCOUNTER — Other Ambulatory Visit: Payer: Self-pay

## 2022-10-19 ENCOUNTER — Ambulatory Visit: Payer: Medicare Other | Admitting: Internal Medicine

## 2022-10-19 DIAGNOSIS — M25531 Pain in right wrist: Secondary | ICD-10-CM

## 2022-10-19 DIAGNOSIS — G720 Drug-induced myopathy: Secondary | ICD-10-CM

## 2022-10-19 DIAGNOSIS — G8929 Other chronic pain: Secondary | ICD-10-CM

## 2022-11-03 ENCOUNTER — Ambulatory Visit: Payer: Medicare Other | Admitting: Family Medicine

## 2022-11-07 ENCOUNTER — Encounter (HOSPITAL_BASED_OUTPATIENT_CLINIC_OR_DEPARTMENT_OTHER): Payer: Self-pay | Admitting: Cardiology

## 2022-11-08 ENCOUNTER — Other Ambulatory Visit: Payer: Self-pay | Admitting: Urology

## 2022-11-29 ENCOUNTER — Ambulatory Visit (INDEPENDENT_AMBULATORY_CARE_PROVIDER_SITE_OTHER): Payer: Medicare Other

## 2022-11-29 VITALS — BP 129/75 | HR 58 | Temp 97.9°F | Resp 18 | Ht 70.0 in | Wt 197.4 lb

## 2022-11-29 DIAGNOSIS — E782 Mixed hyperlipidemia: Secondary | ICD-10-CM | POA: Diagnosis not present

## 2022-11-29 DIAGNOSIS — I6523 Occlusion and stenosis of bilateral carotid arteries: Secondary | ICD-10-CM

## 2022-11-29 MED ORDER — INCLISIRAN SODIUM 284 MG/1.5ML ~~LOC~~ SOSY
284.0000 mg | PREFILLED_SYRINGE | Freq: Once | SUBCUTANEOUS | Status: AC
  Administered 2022-11-29: 284 mg via SUBCUTANEOUS
  Filled 2022-11-29: qty 1.5

## 2022-11-29 NOTE — Progress Notes (Signed)
Diagnosis: Hyperlipidemia  Provider:  Marshell Garfinkel MD  Procedure: Injection  Leqvio (inclisiran), Dose: 284 mg, Site: subcutaneous, Number of injections: 1  Post Care:  N/A  Discharge: Condition: Good, Destination: Home .  Observation period completed. AVS provided to patient.   Performed by:  Cleophus Molt, RN

## 2022-11-29 NOTE — Patient Instructions (Signed)
Inclisiran Injection What is this medication? INCLISIRAN (in kli SIR an) treats high cholesterol. It works by decreasing bad cholesterol (such as LDL) in your blood. Changes to diet and exercise are often combined with this medication. This medicine may be used for other purposes; ask your health care provider or pharmacist if you have questions. COMMON BRAND NAME(S): LEQVIO What should I tell my care team before I take this medication? They need to know if you have any of these conditions: An unusual or allergic reaction to inclisiran, other medications, foods, dyes, or preservatives Pregnant or trying to get pregnant Breast-feeding How should I use this medication? This medication is injected under the skin. It is given by your care team in a hospital or clinic setting. Talk to your care team about the use of this medication in children. Special care may be needed. Overdosage: If you think you have taken too much of this medicine contact a poison control center or emergency room at once. NOTE: This medicine is only for you. Do not share this medicine with others. What if I miss a dose? Keep appointments for follow-up doses. It is important not to miss your dose. Call your care team if you are unable to keep an appointment. What may interact with this medication? Interactions are not expected. This list may not describe all possible interactions. Give your health care provider a list of all the medicines, herbs, non-prescription drugs, or dietary supplements you use. Also tell them if you smoke, drink alcohol, or use illegal drugs. Some items may interact with your medicine. What should I watch for while using this medication? Visit your care team for regular checks on your progress. Tell your care team if your symptoms do not start to get better or if they get worse. You may need blood work while you are taking this medication. What side effects may I notice from receiving this  medication? Side effects that you should report to your care team as soon as possible: Allergic reactions--skin rash, itching, hives, swelling of the face, lips, tongue, or throat Side effects that usually do not require medical attention (report these to your care team if they continue or are bothersome): Joint pain Pain, redness, or irritation at injection site This list may not describe all possible side effects. Call your doctor for medical advice about side effects. You may report side effects to FDA at 1-800-FDA-1088. Where should I keep my medication? This medication is given in a hospital or clinic. It will not be stored at home. NOTE: This sheet is a summary. It may not cover all possible information. If you have questions about this medicine, talk to your doctor, pharmacist, or health care provider.  2023 Elsevier/Gold Standard (2020-12-23 00:00:00)  

## 2022-12-22 ENCOUNTER — Encounter: Payer: Self-pay | Admitting: Family Medicine

## 2022-12-22 ENCOUNTER — Ambulatory Visit (INDEPENDENT_AMBULATORY_CARE_PROVIDER_SITE_OTHER): Payer: Medicare Other | Admitting: Family Medicine

## 2022-12-22 VITALS — BP 112/52 | HR 55 | Temp 97.8°F | Ht 70.0 in | Wt 203.3 lb

## 2022-12-22 DIAGNOSIS — N3001 Acute cystitis with hematuria: Secondary | ICD-10-CM

## 2022-12-22 DIAGNOSIS — R8290 Unspecified abnormal findings in urine: Secondary | ICD-10-CM | POA: Diagnosis not present

## 2022-12-22 LAB — POC URINALSYSI DIPSTICK (AUTOMATED)
Bilirubin, UA: NEGATIVE
Glucose, UA: NEGATIVE
Ketones, UA: NEGATIVE
Nitrite, UA: NEGATIVE
Protein, UA: POSITIVE — AB
Spec Grav, UA: 1.02 (ref 1.010–1.025)
Urobilinogen, UA: 0.2 E.U./dL
pH, UA: 6 (ref 5.0–8.0)

## 2022-12-22 MED ORDER — CIPROFLOXACIN HCL 250 MG PO TABS: 250.0000 mg | ORAL_TABLET | Freq: Two times a day (BID) | ORAL | 0 refills | Status: AC

## 2022-12-22 NOTE — Progress Notes (Signed)
Established Patient Office Visit  Subjective   Patient ID: Tristan Hamilton, male    DOB: 1951/12/15  Age: 72 y.o. MRN: [id number]  Chief Complaint  Patient presents with   Abdominal Pain    Patient complains of left-sided "rib" pain x3-4 weeks, worse when the bladder is full and denies dysuria; patient uses self-catheter and complains of cloudy urine x1 week and has upcoming prostate surgery    3-4 week history of left side pain, states that it feels like a pressure, kind of like gas pain, not relieved with bowel movement. Not a severe pain but noticeable, constant pain. No fever or chills, he does perform self-cathing daily, is scheduled for a TURP on 01/04/2023. Was seen in the ER in October with acute urinary retention and that is when they instructed him to start cathing himself. Is going to see the urologist next week for his preop visit. States that he isn't self cathing as much, is still producing urine on his own. Pt states he was told to cath due to the bladder stone that he was diagnosed with previously.    Current Outpatient Medications  Medication Instructions   Cholecalciferol 5,000 Units, Oral, Daily   cimetidine (CIMETIDINE 200) 200 mg, Oral, 2 times daily PRN, Pt isn't sure of the dosage   ciprofloxacin (CIPRO) 250 mg, Oral, 2 times daily   Coenzyme Q10 100 MG TABS 1 tablet, Oral, Daily, Takes 200 mg   DHEA 50 MG TABS 1 tablet, Oral, Daily   Hyprom-Naphaz-Polysorb-Zn Sulf (CLEAR EYES COMPLETE OP) Ophthalmic   inclisiran (LEQVIO) 284 mg, Subcutaneous,  Once   silodosin (RAPAFLO) 8 mg, Oral, Daily with breakfast   vitamin B-12 (CYANOCOBALAMIN) 50 mcg, Oral, Daily    Patient Active Problem List   Diagnosis Date Noted   TFCC (triangular fibrocartilage complex) injury, right, initial encounter 09/22/2022   Prediabetes 07/29/2022   Bilateral carotid artery stenosis 07/06/2022   Ganglion cyst 06/09/2022   Back abscess 06/09/2022   Bilateral wrist pain 02/14/2022   Bilateral  shoulder pain 02/14/2022   Myalgia 01/03/2022   Hand cramps 01/03/2022   Statin myopathy 01/03/2022   Hyperlipidemia 11/10/2021   OA (osteoarthritis) of knee 03/24/2020   Status post total bilateral knee replacement 03/24/2020   Acquired renal cyst of right kidney 01/21/2020   Prostate cancer ([place]) 11/01/2019   Enlarged prostate 11/01/2019   Vitamin D deficiency 11/01/2019   Bilateral primary osteoarthritis of knee 09/17/2019      Review of Systems  All other systems reviewed and are negative.     Objective:     BP (!) 112/52 (BP Location: Left Arm, Patient Position: Sitting, Cuff Size: Large)   Pulse (!) 55   Temp 97.8 F (36.6 C) (Oral)   Ht '5\' 10"'$  (1.778 m)   Wt 203 lb 4.8 oz (92.2 kg)   SpO2 97%   BMI 29.17 kg/m    Physical Exam Vitals reviewed.  Constitutional:      Appearance: Normal appearance. He is well-groomed and normal weight.  Eyes:     Conjunctiva/sclera: Conjunctivae normal.  Neck:     Thyroid: No thyromegaly.  Cardiovascular:     Rate and Rhythm: Regular rhythm.     Heart sounds: S1 normal and S2 normal. No murmur heard. Pulmonary:     Effort: Pulmonary effort is normal.     Breath sounds: Normal air entry.  Abdominal:     General: Abdomen is flat. Bowel sounds are normal.     Tenderness: There  is no abdominal tenderness. There is no right CVA tenderness or left CVA tenderness.  Musculoskeletal:     Right lower leg: No edema.     Left lower leg: No edema.  Neurological:     General: No focal deficit present.     Mental Status: He is alert and oriented to person, place, and time.     Gait: Gait is intact.  Psychiatric:        Mood and Affect: Mood and affect normal.      Results for orders placed or performed in visit on 12/22/22  POCT Urinalysis Dipstick (Automated)  Result Value Ref Range   Color, UA yellow    Clarity, UA cloudy    Glucose, UA Negative Negative   Bilirubin, UA negative    Ketones, UA negative    Spec Grav, UA  1.020 1.010 - 1.025   Blood, UA 2+    pH, UA 6.0 5.0 - 8.0   Protein, UA Positive (A) Negative   Urobilinogen, UA 0.2 0.2 or 1.0 E.U./dL   Nitrite, UA negative    Leukocytes, UA Large (3+) (A) Negative      The 10-year ASCVD risk score (Arnett DK, et al., 2019) is: 24.7%    Assessment & Plan:   Problem List Items Addressed This Visit   None Visit Diagnoses     Cloudy urine    -  Primary   Relevant Orders   POCT Urinalysis Dipstick (Automated) (Completed)   Acute cystitis with hematuria       Relevant Medications   Complicated by self cathing that he was instructed to do by the urologist. I will treat with cipro 250 mg BID for 10 days and send the urine for culture. He is meeting with the urologist on 12/26/22 -- the urine culture wil be back by then and they can decide if the abx need to be continued based on the culture results.  ciprofloxacin (CIPRO) 250 MG tablet   Other Relevant Orders   Culture, Urine       No follow-ups on file.    Farrel Conners, MD

## 2022-12-25 LAB — URINE CULTURE
MICRO NUMBER:: 14388502
SPECIMEN QUALITY:: ADEQUATE

## 2023-01-02 ENCOUNTER — Encounter (HOSPITAL_BASED_OUTPATIENT_CLINIC_OR_DEPARTMENT_OTHER): Payer: Self-pay | Admitting: Urology

## 2023-01-02 ENCOUNTER — Other Ambulatory Visit: Payer: Self-pay

## 2023-01-02 NOTE — Progress Notes (Signed)
Spoke w/ via phone for pre-op interview---Tristan Hamilton needs dos---- none             Hamilton results------09/06/22 EKG in Epic & chart, 07/29/22 CT angio chest COVID test -----patient states asymptomatic no test needed Arrive at -------0815 on Wednesday, 01/04/23 NPO after MN NO Solid Food.  Clear liquids from MN until---0715 Med rec completed Medications to take morning of surgery -----Cimetidine, Rapaflo Diabetic medication -----n/a Patient instructed no nail polish to be worn day of surgery Patient instructed to bring photo id and insurance card day of surgery Patient aware to have Driver (ride ) / caregiver    for 24 hours after surgery - wife, Faith Patient Special Instructions -----Upper partial denture will need to be removed prior to OR. Pre-Op special Istructions -----none Patient verbalized understanding of instructions that were given at this phone interview. Patient denies shortness of breath, chest pain, fever, cough at this phone interview.

## 2023-01-04 ENCOUNTER — Ambulatory Visit (HOSPITAL_BASED_OUTPATIENT_CLINIC_OR_DEPARTMENT_OTHER): Payer: Medicare Other | Admitting: Anesthesiology

## 2023-01-04 ENCOUNTER — Other Ambulatory Visit: Payer: Self-pay

## 2023-01-04 ENCOUNTER — Observation Stay (HOSPITAL_BASED_OUTPATIENT_CLINIC_OR_DEPARTMENT_OTHER)
Admission: RE | Admit: 2023-01-04 | Discharge: 2023-01-05 | Disposition: A | Discharge status: Home / Self Care | Payer: Medicare Other | Attending: Urology | Admitting: Urology

## 2023-01-04 ENCOUNTER — Encounter (HOSPITAL_BASED_OUTPATIENT_CLINIC_OR_DEPARTMENT_OTHER)
Admission: RE | Disposition: A | Discharge status: Home / Self Care | Payer: Self-pay | Source: Home / Self Care | Attending: Urology

## 2023-01-04 ENCOUNTER — Encounter (HOSPITAL_BASED_OUTPATIENT_CLINIC_OR_DEPARTMENT_OTHER): Payer: Self-pay | Admitting: Urology

## 2023-01-04 DIAGNOSIS — Z8546 Personal history of malignant neoplasm of prostate: Secondary | ICD-10-CM | POA: Insufficient documentation

## 2023-01-04 DIAGNOSIS — N4340 Spermatocele of epididymis, unspecified: Secondary | ICD-10-CM | POA: Insufficient documentation

## 2023-01-04 DIAGNOSIS — N401 Enlarged prostate with lower urinary tract symptoms: Principal | ICD-10-CM | POA: Diagnosis present

## 2023-01-04 DIAGNOSIS — I2510 Atherosclerotic heart disease of native coronary artery without angina pectoris: Secondary | ICD-10-CM | POA: Diagnosis not present

## 2023-01-04 DIAGNOSIS — N400 Enlarged prostate without lower urinary tract symptoms: Secondary | ICD-10-CM

## 2023-01-04 DIAGNOSIS — R310 Gross hematuria: Secondary | ICD-10-CM | POA: Insufficient documentation

## 2023-01-04 DIAGNOSIS — N138 Other obstructive and reflux uropathy: Secondary | ICD-10-CM

## 2023-01-04 DIAGNOSIS — I739 Peripheral vascular disease, unspecified: Secondary | ICD-10-CM | POA: Diagnosis not present

## 2023-01-04 DIAGNOSIS — G709 Myoneural disorder, unspecified: Secondary | ICD-10-CM

## 2023-01-04 DIAGNOSIS — N320 Bladder-neck obstruction: Secondary | ICD-10-CM | POA: Insufficient documentation

## 2023-01-04 DIAGNOSIS — Z01818 Encounter for other preprocedural examination: Secondary | ICD-10-CM

## 2023-01-04 DIAGNOSIS — R7309 Other abnormal glucose: Secondary | ICD-10-CM | POA: Diagnosis not present

## 2023-01-04 HISTORY — PX: TRANSURETHRAL RESECTION OF PROSTATE: SHX73

## 2023-01-04 HISTORY — DX: Malignant (primary) neoplasm, unspecified: C80.1

## 2023-01-04 LAB — GLUCOSE, CAPILLARY: Glucose-Capillary: 88 mg/dL (ref 70–99)

## 2023-01-04 SURGERY — TURP (TRANSURETHRAL RESECTION OF PROSTATE)
Anesthesia: General | Site: Prostate

## 2023-01-04 MED ORDER — EPHEDRINE 5 MG/ML INJ
INTRAVENOUS | Status: AC
  Filled 2023-01-04: qty 5

## 2023-01-04 MED ORDER — DIPHENHYDRAMINE HCL 12.5 MG/5ML PO ELIX: 12.5000 mg | ORAL_SOLUTION | Freq: Four times a day (QID) | ORAL | Status: DC | PRN

## 2023-01-04 MED ORDER — ARTIFICIAL TEARS OPHTHALMIC OINT
TOPICAL_OINTMENT | OPHTHALMIC | Status: AC
  Filled 2023-01-04: qty 3.5

## 2023-01-04 MED ORDER — CEPHALEXIN 500 MG PO CAPS: 500.0000 mg | ORAL_CAPSULE | Freq: Two times a day (BID) | ORAL | 0 refills | Status: AC

## 2023-01-04 MED ORDER — EPHEDRINE SULFATE-NACL 50-0.9 MG/10ML-% IV SOSY
PREFILLED_SYRINGE | INTRAVENOUS | Status: DC | PRN
  Administered 2023-01-04 (×2): 10 mg via INTRAVENOUS
  Administered 2023-01-04: 5 mg via INTRAVENOUS
  Administered 2023-01-04: 10 mg via INTRAVENOUS

## 2023-01-04 MED ORDER — CEFAZOLIN SODIUM-DEXTROSE 2-4 GM/100ML-% IV SOLN
2.0000 g | INTRAVENOUS | Status: AC
  Administered 2023-01-04: 2 g via INTRAVENOUS

## 2023-01-04 MED ORDER — OXYBUTYNIN CHLORIDE 5 MG PO TABS: 5.0000 mg | ORAL_TABLET | Freq: Three times a day (TID) | ORAL | 1 refills | Status: DC | PRN

## 2023-01-04 MED ORDER — CEFAZOLIN SODIUM-DEXTROSE 1-4 GM/50ML-% IV SOLN
INTRAVENOUS | Status: AC
  Filled 2023-01-04: qty 50

## 2023-01-04 MED ORDER — FENTANYL CITRATE (PF) 100 MCG/2ML IJ SOLN
INTRAMUSCULAR | Status: AC
  Filled 2023-01-04: qty 2

## 2023-01-04 MED ORDER — LIDOCAINE 2% (20 MG/ML) 5 ML SYRINGE
INTRAMUSCULAR | Status: DC | PRN
  Administered 2023-01-04: 80 mg via INTRAVENOUS

## 2023-01-04 MED ORDER — SODIUM CHLORIDE 0.9 % IV SOLN: INTRAVENOUS | Status: DC

## 2023-01-04 MED ORDER — ONDANSETRON HCL 4 MG/2ML IJ SOLN: 4.0000 mg | Freq: Once | INTRAMUSCULAR | Status: DC | PRN

## 2023-01-04 MED ORDER — ONDANSETRON HCL 4 MG/2ML IJ SOLN
INTRAMUSCULAR | Status: AC
  Filled 2023-01-04: qty 2

## 2023-01-04 MED ORDER — ACETAMINOPHEN 500 MG PO TABS
ORAL_TABLET | ORAL | Status: AC
  Filled 2023-01-04: qty 2

## 2023-01-04 MED ORDER — PHENYLEPHRINE 80 MCG/ML (10ML) SYRINGE FOR IV PUSH (FOR BLOOD PRESSURE SUPPORT)
PREFILLED_SYRINGE | INTRAVENOUS | Status: AC
  Filled 2023-01-04: qty 10

## 2023-01-04 MED ORDER — FENTANYL CITRATE (PF) 100 MCG/2ML IJ SOLN: 25.0000 ug | INTRAMUSCULAR | Status: DC | PRN

## 2023-01-04 MED ORDER — SENNOSIDES-DOCUSATE SODIUM 8.6-50 MG PO TABS
1.0000 | ORAL_TABLET | Freq: Every evening | ORAL | Status: DC | PRN
  Filled 2023-01-04: qty 1

## 2023-01-04 MED ORDER — HYDROCODONE-ACETAMINOPHEN 5-325 MG PO TABS: 1.0000 | ORAL_TABLET | ORAL | Status: DC | PRN

## 2023-01-04 MED ORDER — SODIUM CHLORIDE 0.9 % IR SOLN
3000.0000 mL | Status: DC
  Administered 2023-01-05: 3000 mL

## 2023-01-04 MED ORDER — ZOLPIDEM TARTRATE 5 MG PO TABS: 5.0000 mg | ORAL_TABLET | Freq: Every evening | ORAL | Status: DC | PRN

## 2023-01-04 MED ORDER — SODIUM CHLORIDE 0.9 % IR SOLN
Status: DC | PRN
  Administered 2023-01-04 (×9): 3000 mL

## 2023-01-04 MED ORDER — HYDROMORPHONE HCL 1 MG/ML IJ SOLN: 0.5000 mg | INTRAMUSCULAR | Status: DC | PRN

## 2023-01-04 MED ORDER — LACTATED RINGERS IV SOLN: INTRAVENOUS | Status: DC

## 2023-01-04 MED ORDER — PHENAZOPYRIDINE HCL 200 MG PO TABS: 200.0000 mg | ORAL_TABLET | Freq: Three times a day (TID) | ORAL | 0 refills | Status: DC | PRN

## 2023-01-04 MED ORDER — DEXAMETHASONE SODIUM PHOSPHATE 10 MG/ML IJ SOLN
INTRAMUSCULAR | Status: AC
  Filled 2023-01-04: qty 1

## 2023-01-04 MED ORDER — ACETAMINOPHEN 500 MG PO TABS
1000.0000 mg | ORAL_TABLET | Freq: Once | ORAL | Status: AC
  Administered 2023-01-04: 1000 mg via ORAL

## 2023-01-04 MED ORDER — DEXAMETHASONE SODIUM PHOSPHATE 10 MG/ML IJ SOLN
INTRAMUSCULAR | Status: DC | PRN
  Administered 2023-01-04: 5 mg via INTRAVENOUS

## 2023-01-04 MED ORDER — PROPOFOL 10 MG/ML IV BOLUS
INTRAVENOUS | Status: DC | PRN
  Administered 2023-01-04: 170 mg via INTRAVENOUS
  Administered 2023-01-04 (×2): 30 mg via INTRAVENOUS

## 2023-01-04 MED ORDER — FENTANYL CITRATE (PF) 100 MCG/2ML IJ SOLN
INTRAMUSCULAR | Status: DC | PRN
  Administered 2023-01-04 (×4): 50 ug via INTRAVENOUS

## 2023-01-04 MED ORDER — PROPOFOL 10 MG/ML IV BOLUS
INTRAVENOUS | Status: AC
  Filled 2023-01-04: qty 20

## 2023-01-04 MED ORDER — STERILE WATER FOR IRRIGATION IR SOLN
Status: DC | PRN
  Administered 2023-01-04: 500 mL

## 2023-01-04 MED ORDER — ONDANSETRON HCL 4 MG/2ML IJ SOLN
INTRAMUSCULAR | Status: DC | PRN
  Administered 2023-01-04: 4 mg via INTRAVENOUS

## 2023-01-04 MED ORDER — CEFAZOLIN SODIUM-DEXTROSE 2-4 GM/100ML-% IV SOLN
INTRAVENOUS | Status: AC
  Filled 2023-01-04: qty 100

## 2023-01-04 MED ORDER — DIPHENHYDRAMINE HCL 50 MG/ML IJ SOLN: 12.5000 mg | Freq: Four times a day (QID) | INTRAMUSCULAR | Status: DC | PRN

## 2023-01-04 MED ORDER — OXYBUTYNIN CHLORIDE 5 MG PO TABS: 5.0000 mg | ORAL_TABLET | Freq: Three times a day (TID) | ORAL | Status: DC | PRN

## 2023-01-04 MED ORDER — CEFAZOLIN SODIUM-DEXTROSE 1-4 GM/50ML-% IV SOLN
1.0000 g | Freq: Three times a day (TID) | INTRAVENOUS | Status: DC
  Administered 2023-01-04 – 2023-01-05 (×2): 1 g via INTRAVENOUS

## 2023-01-04 MED ORDER — TRAMADOL HCL 50 MG PO TABS: 50.0000 mg | ORAL_TABLET | Freq: Four times a day (QID) | ORAL | 0 refills | Status: AC | PRN

## 2023-01-04 MED ORDER — PHENYLEPHRINE 80 MCG/ML (10ML) SYRINGE FOR IV PUSH (FOR BLOOD PRESSURE SUPPORT)
PREFILLED_SYRINGE | INTRAVENOUS | Status: DC | PRN
  Administered 2023-01-04 (×2): 160 ug via INTRAVENOUS
  Administered 2023-01-04 (×3): 80 ug via INTRAVENOUS

## 2023-01-04 MED ORDER — ONDANSETRON HCL 4 MG/2ML IJ SOLN: 4.0000 mg | INTRAMUSCULAR | Status: DC | PRN

## 2023-01-04 MED ORDER — ACETAMINOPHEN 325 MG PO TABS: 650.0000 mg | ORAL_TABLET | ORAL | Status: DC | PRN

## 2023-01-04 MED ORDER — LIDOCAINE HCL (PF) 2 % IJ SOLN
INTRAMUSCULAR | Status: AC
  Filled 2023-01-04: qty 5

## 2023-01-04 MED ORDER — MIDAZOLAM HCL 2 MG/2ML IJ SOLN
INTRAMUSCULAR | Status: AC
  Filled 2023-01-04: qty 2

## 2023-01-04 MED ORDER — MIDAZOLAM HCL 2 MG/2ML IJ SOLN
INTRAMUSCULAR | Status: DC | PRN
  Administered 2023-01-04: 1 mg via INTRAVENOUS

## 2023-01-04 SURGICAL SUPPLY — 26 items
BAG DRAIN URO-CYSTO SKYTR STRL (DRAIN) ×1 IMPLANT
BAG DRN RND TRDRP ANRFLXCHMBR (UROLOGICAL SUPPLIES) ×1
BAG DRN UROCATH (DRAIN) ×1
BAG URINE DRAIN 2000ML AR STRL (UROLOGICAL SUPPLIES) ×1 IMPLANT
BAND INSRT 18 STRL LF DISP RB (MISCELLANEOUS)
BAND RUBBER #18 3X1/16 STRL (MISCELLANEOUS) IMPLANT
CATH FOLEY 3WAY 30CC 22FR (CATHETERS) IMPLANT
CATH FOLEY 3WAY 30CC 24FR (CATHETERS) ×1
CATH URTH STD 24FR FL 3W 2 (CATHETERS) IMPLANT
GLOVE BIO SURGEON STRL SZ7.5 (GLOVE) ×1 IMPLANT
GLOVE BIOGEL PI IND STRL 6.5 (GLOVE) IMPLANT
GOWN STRL REUS W/TWL LRG LVL3 (GOWN DISPOSABLE) IMPLANT
GOWN STRL REUS W/TWL XL LVL3 (GOWN DISPOSABLE) ×1 IMPLANT
HOLDER FOLEY CATH W/STRAP (MISCELLANEOUS) IMPLANT
IV NS IRRIG 3000ML ARTHROMATIC (IV SOLUTION) ×2 IMPLANT
KIT TURNOVER CYSTO (KITS) ×1 IMPLANT
LOOP CUT BIPOLAR 24F LRG (ELECTROSURGICAL) IMPLANT
MANIFOLD NEPTUNE II (INSTRUMENTS) ×1 IMPLANT
PACK CYSTO (CUSTOM PROCEDURE TRAY) ×1 IMPLANT
PIN SAFETY STERILE (MISCELLANEOUS) ×1 IMPLANT
SYR 30ML LL (SYRINGE) ×1 IMPLANT
SYR TOOMEY IRRIG 70ML (MISCELLANEOUS) ×1
SYRINGE TOOMEY IRRIG 70ML (MISCELLANEOUS) ×1 IMPLANT
TUBE CONNECTING 12X1/4 (SUCTIONS) ×1 IMPLANT
TUBING UROLOGY SET (TUBING) ×1 IMPLANT
WATER STERILE IRR 500ML POUR (IV SOLUTION) IMPLANT

## 2023-01-04 NOTE — Anesthesia Preprocedure Evaluation (Addendum)
Anesthesia Evaluation  Patient identified by MRN, date of birth, ID band Patient awake    Reviewed: Allergy & Precautions, NPO status , Patient's Chart, lab work & pertinent test results  Airway Mallampati: II  TM Distance: >3 FB Neck ROM: Full    Dental  (+) Dental Advisory Given, Partial Upper   Pulmonary neg pulmonary ROS   Pulmonary exam normal breath sounds clear to auscultation       Cardiovascular + CAD and + Peripheral Vascular Disease (Ascending aorta dilation, B/L CAS)  Normal cardiovascular exam Rhythm:Regular Rate:Normal     Neuro/Psych  Neuromuscular disease  negative psych ROS   GI/Hepatic Neg liver ROS,GERD  ,,  Endo/Other  negative endocrine ROS    Renal/GU Renal disease   H/o prostate cancer BENIGN PROSTATIC HYPERPLASIA    Musculoskeletal  (+) Arthritis ,    Abdominal   Peds  Hematology negative hematology ROS (+)   Anesthesia Other Findings Day of surgery medications reviewed with the patient.  Reproductive/Obstetrics                             Anesthesia Physical Anesthesia Plan  ASA: 3  Anesthesia Plan: General   Post-op Pain Management: Tylenol PO (pre-op)*   Induction: Intravenous  PONV Risk Score and Plan: 3 and Dexamethasone and Ondansetron  Airway Management Planned: LMA  Additional Equipment:   Intra-op Plan:   Post-operative Plan: Extubation in OR  Informed Consent: I have reviewed the patients History and Physical, chart, labs and discussed the procedure including the risks, benefits and alternatives for the proposed anesthesia with the patient or authorized representative who has indicated his/her understanding and acceptance.     Dental advisory given  Plan Discussed with: CRNA  Anesthesia Plan Comments:         Anesthesia Quick Evaluation

## 2023-01-04 NOTE — Transfer of Care (Signed)
Immediate Anesthesia Transfer of Care Note  Patient: Tristan Hamilton  Procedure(s) Performed: Procedure(s) (LRB): TRANSURETHRAL RESECTION OF THE PROSTATE (TURP) (N/A)  Patient Location: PACU  Anesthesia Type: General  Level of Consciousness: awake, alert  and oriented  Airway & Oxygen Therapy: Patient Spontanous Breathing and Patient connected to nasal cannula oxygen  Post-op Assessment: Report given to PACU RN and Post -op Vital signs reviewed and stable  Post vital signs: Reviewed and stable  Complications: No apparent anesthesia complications  Last Vitals:  Vitals Value Taken Time  BP 132/78 01/04/23 1119  Temp    Pulse 66 01/04/23 1122  Resp 10 01/04/23 1122  SpO2 93 % 01/04/23 1122  Vitals shown include unvalidated device data.  Last Pain:  Vitals:   01/04/23 0837  TempSrc: Oral  PainSc: 0-No pain      Patients Stated Pain Goal: 6 (47/09/62 8366)  Complications: No notable events documented.

## 2023-01-04 NOTE — Anesthesia Procedure Notes (Signed)
Procedure Name: LMA Insertion Date/Time: 01/04/2023 10:03 AM  Performed by: Mechele Claude, CRNAPre-anesthesia Checklist: Patient identified, Emergency Drugs available, Suction available and Patient being monitored Patient Re-evaluated:Patient Re-evaluated prior to induction Oxygen Delivery Method: Circle System Utilized Preoxygenation: Pre-oxygenation with 100% oxygen Induction Type: IV induction Ventilation: Mask ventilation without difficulty LMA: LMA inserted LMA Size: 4.0 Number of attempts: 1 Airway Equipment and Method: Bite block Placement Confirmation: positive ETCO2 Tube secured with: Tape Dental Injury: Teeth and Oropharynx as per pre-operative assessment

## 2023-01-04 NOTE — Anesthesia Postprocedure Evaluation (Signed)
Anesthesia Post Note  Patient: Tristan Hamilton  Procedure(s) Performed: TRANSURETHRAL RESECTION OF THE PROSTATE (TURP) (Prostate)     Patient location during evaluation: PACU Anesthesia Type: General Level of consciousness: awake and alert Pain management: pain level controlled Vital Signs Assessment: post-procedure vital signs reviewed and stable Respiratory status: spontaneous breathing, nonlabored ventilation, respiratory function stable and patient connected to nasal cannula oxygen Cardiovascular status: blood pressure returned to baseline and stable Postop Assessment: no apparent nausea or vomiting Anesthetic complications: no   No notable events documented.  Last Vitals:  Vitals:   01/04/23 1130 01/04/23 1145  BP: 128/75 128/77  Pulse: 66 62  Resp: 13 (!) 9  Temp: 36.6 C   SpO2: 93% 95%    Last Pain:  Vitals:   01/04/23 1130  TempSrc:   PainSc: 0-No pain                 Santa Lighter

## 2023-01-04 NOTE — H&P (Signed)
Office Visit Report     12/26/2022   --------------------------------------------------------------------------------   Tristan Hamilton  MRN: [id number]  DOB: 1951/09/03, 72 year old Male  PRIMARY CARE:  Annie Main A. Sarajane Jews, MD  PRIMARY CARE FAX:  [phone]  REFERRING:  Daine Gravel, NP  PROVIDER:  [name], M.D.  TREATING:  Daine Gravel, NP  LOCATION:  [facility] [phone]     --------------------------------------------------------------------------------   CC/HPI: Prostate cancer   The patient is a 72 year old male with a history of Gleason 6 prostate cancer that was diagnosed in [place], [place] in 2011. 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   1. Prostate cancer- Last PSA: 4.03 (06/2022), 5.75 (12/2021), 4.17 (01/2021), 4.04 (12/2018). No family history of prostate cancer. Prostate volume: 69 cm. His prostate biopsy in 2019 showed Gleason 6 prostate cancer. He had a repeat MRI guided prostate biopsy on 01/31/20 that was NED in all 16 cores. Had a near syncopal episode immediately following his PNBx. He is urinating w/o difficulty and denies dysuria or hematuria.   2. 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   3. Gross hematuria: The patient reports multiple episodes of painless gross hematuria, specifically after mowing his yard (on a riding lawn more) and lifting heavy objects. He is a non-smoker and denies any personal/family history of kidney or bladder cancer. No recent cystoscopy or upper tract imaging.   08/19/2022: The patient is here today for a cystoscopy and renal ultrasound to complete his hematuria evaluation.   AUASS- 8/2  SHIM- 23   10/19/2022: Pt here today for repeat TOV. He underwent cystolitholopaxy, bilateral retrograde pyelogram and bladder fulguration with Dr. Lovena Neighbours  on 10/11.   Intraoperative findings:  1. 2.5 cm bladder stone  2. Trilobar prostatic urethral obstruction  3. Moderate bladder trabeculation  4. Bleeding from the bladder neck following cystolitholapaxy that required Bugbee fulguration  5. Solitary left collecting system with no filling defects or dilation involving the left ureter or left renal pelvis seen on retrograde pyelogram  6. Solitary right collecting system with no filling defects or dilation involving the right ureter or right renal pelvis seen on retrograde pyelogram   He developed postoperative urinary retention and had to have Foley catheter placed in the ED on 10/14 draining close to 1 L from the bladder per my review. Provider had empirically placed him on Bactrim due to worsening symptomology at that time as well. ED urine culture was negative for bacterial growth. He remains on Rapaflo. He failed voiding trial here on 10/18. Now back today for trial of void. If unsuccessful, his urologist has recommended TURP given the above-mentioned intraoperative findings, the patient has been extensively counseled regarding this recommendation.   11/02/2022: 72 year old man who presents today for 2-week follow-up after he underwent a trial of void at last office visit. He is currently voiding every 20 to 30 minutes and getting up multiple times a night. He reports that he does not feel fully empty. He denies fevers, chills, gross hematuria. His PVR today is approximately 370 mL.   12/26/2022: 72 year old man who presents for preoperative appointment prior to undergoing TURP with his urologist Dr. Lovena Neighbours on 01/04/2023. He has not had any fevers, chills, chest pain or shortness of breath. He continues to perform CIC 1 time per day and will get approximately 200  mL out. He does feel his bladder is emptying better on its own. He continues silodosin milligrams daily.     ALLERGIES: All Statins Rosuvastatin - Other Reaction, muscle soreness     MEDICATIONS: Rapaflo 8 mg capsule 1 capsule PO Daily  Aleve 220 mg tablet  Co-Q 10  Dhea  Prednisone 5 mg tablet 1 tablet PO Daily  PREDNISONE PO Daily  VITAMIN B12 PO Daily  VITAMIN D3 PO Daily     GU PSH: Cysto Bladder Stone >2.5cm - 09/28/2022 Cystoscopy - 08/19/2022 Cystoscopy Fulguration - 09/28/2022 Prostate Needle Biopsy - 2021 UROLIFT - about 2014 Vasectomy - about 1980       PSH Notes: R knee Scope  L testicle removed    NON-GU PSH: Surgical Pathology, Gross And Microscopic Examination For Prostate Needle - 2021 Tonsillectomy - about 1960     GU PMH: BPH w/o LUTS - 11/02/2022, - 10/05/2022, - 2021 Urinary Retention - 11/02/2022, - 10/19/2022 BPH w/LUTS - 10/19/2022 Bladder Stone - 10/05/2022, - 08/19/2022 Gross hematuria - 08/19/2022, - 07/04/2022 Prostate Cancer - 07/04/2022, - 07/02/2021, - 2021 Spermatocele of epididymis, Unspec - 07/02/2021 History of urolithiasis - 2021    NON-GU PMH: Arthritis    FAMILY HISTORY: 1 Daughter - No Family History 1 son - No Family History Prostate Cancer - Grandfather   SOCIAL HISTORY: Marital Status: Married Preferred Language: English; Race: White Current Smoking Status: Patient has never smoked.   Tobacco Use Assessment Completed: Used Tobacco in last 30 days? Does not use smokeless tobacco. Has never drank.  Does not use drugs. Drinks 1 caffeinated drink per day. Has not had a blood transfusion. 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    REVIEW OF SYSTEMS:    GU Review Male:   Patient reports get up at night to urinate. Patient denies frequent urination, hard to postpone urination, burning/ pain with urination, leakage of urine, stream starts and stops, trouble starting your stream, have to strain to urinate , erection problems, and penile pain.  Gastrointestinal (Upper):   Patient denies nausea, vomiting, and indigestion/ heartburn.  Gastrointestinal (Lower):   Patient denies  diarrhea and constipation.  Constitutional:   Patient denies fever, night sweats, weight loss, and fatigue.  Skin:   Patient denies skin rash/ lesion and itching.  Eyes:   Patient denies blurred vision and double vision.  Cardiovascular:   Patient denies leg swelling and chest pains.  Respiratory:   Patient denies cough and shortness of breath.  Endocrine:   Patient denies excessive thirst.  Musculoskeletal:   Patient denies back pain and joint pain.  Neurological:   Patient denies dizziness and headaches.  Psychologic:   Patient denies depression and anxiety.   VITAL SIGNS:      12/26/2022 10:13 AM  BP 136/72 mmHg  Pulse 57 /min  Temperature 97.8 F / 36.5 C   MULTI-SYSTEM PHYSICAL EXAMINATION:    Constitutional: Well-nourished. No physical deformities. Normally developed. Good grooming.  Neck: Neck symmetrical, not swollen. Normal tracheal position.  Respiratory: No labored breathing, no use of accessory muscles.   Cardiovascular: Normal temperature, normal extremity pulses, no swelling, no varicosities.  Lymphatic: No enlargement of neck, axillae, groin.  Skin: No paleness, no jaundice, no cyanosis. No lesion, no ulcer, no rash.  Neurologic / Psychiatric: Oriented to time, oriented to place, oriented to person. No depression, no anxiety, no agitation.  Gastrointestinal: No mass, no tenderness, no rigidity, non obese abdomen.  Eyes: Normal conjunctivae. Normal eyelids.  Ears, Nose, Mouth, and Throat: Left ear no scars, no lesions, no masses. Right ear no scars, no lesions, no masses. Nose no scars, no lesions, no masses. Normal hearing. Normal lips.  Musculoskeletal: Normal gait and station of head and neck.     Complexity of Data:  Source Of History:  Patient  Records Review:   Previous Doctor Records, Previous Patient Records   06/27/22 01/03/22 07/02/21 02/11/21 08/13/20 01/31/20  PSA  Total PSA 4.03 ng/mL 5.75 ng/mL 2.91 ng/mL 4.17 ng/mL 4.26 ng/mL 4.36 ng/mL     PROCEDURES: None   ASSESSMENT:      ICD-10 Details  1 GU:   BPH w/LUTS - N40.1 Chronic, Stable   PLAN:           Document Letter(s):  Created for Patient: Clinical Summary         Notes:   He is currently on an antibiotic provided to him by his primary care physician due to urinary tract infection. He is on ciprofloxacin and has had 3 days of this medication and has approximately 7 more left. He was unable to leave a urine specimen due to performing catheterization prior to coming to the office today. All of the questions that he had regarding his upcoming procedure were answered to the best of my ability. He understands that if he develops any chest pain, shortness of breath, fevers, respiratory issues he needs to notify the office.    -The risks, benefits and alternatives of cystoscopy with TURP was discussed with the patient.  The risks included, but are not limited to, bleeding,  urinary tract infection, bladder perforation requiring prolonged catheterization and/or open bladder repair, ureteral injury, ureteral obstruction, urethral stricture disease, new or worsening voiding dysfunction, retrograde ejaculation, MI, CVA, PE, DVT and the inherent risks of general anesthesia.  We also discussed the need for Foley catheterization for at least 3 days post-op and the likely need for post-op observation in the hospital following the procedure.  The patient voices understanding and wishes to proceed.

## 2023-01-04 NOTE — Op Note (Signed)
Operative Note  Preoperative diagnosis:  1.  BPH with bladder outlet obstruction  Postoperative diagnosis: 1.  BPH with bladder outlet obstruction  Procedure(s): 1.  Bipolar TURP  Surgeon: [name], MD  Assistants:  None  Anesthesia:  General  Complications:  None  EBL: 50 mL  Specimens: 1. Prostate chips 2.  Multiple UroLift tines  Drains/Catheters: 1.  24 French three-way Foley catheter with 30 mL of sterile water in the balloon  Intraoperative findings:   Trilobar prostatic urethral obstruction Multiple UroLift tines were encountered and excised No intra vesicle or urethral abnormalities were seen  Indication:  Tristan Hamilton is a 72 y.o. male with a history of BPH with refractory LUTS despite UroLift and multiple oral medications.  He has been consented for the above procedures, voices understanding and wishes to proceed.  Description of procedure:  After informed consent was obtained, the patient was brought to the operating room and general anesthesia was administered. The patient was then placed in the dorsolithotomy position and prepped and draped in usual sterile fashion. A timeout was performed. A 23 French rigid cystoscope was then inserted into the urethral meatus and advanced into the bladder under direct vision. A complete bladder survey revealed no intravesical pathology.  Both ureteral orifices were identified and well away from the bladder neck.  The rigid cystoscope was then exchanged for a 26 French resectoscope with a bipolar loop working element.  Starting at the bladder neck and progressing distally to the verumontanum, the prostatic adenoma was systematically resected until a widely patent prostatic urethral channel was created.  Multiple UroLift tines were encountered and resected.  All prostate chips and UroLift tines were then hand irrigated out of the bladder and sent to pathology for permanent section.  The resectoscope was then removed and  exchanged for a 24 [place] three-way Foley catheter.  The three-way Foley catheter was then extensively hand irrigated until the irrigant returned clear to light pink.  The catheter was then placed to continuous bladder irrigation and placed on rubber band traction.  He tolerated the procedure well and was transferred to the postanesthesia unit in stable condition.  Plan:  CBI overnight

## 2023-01-05 ENCOUNTER — Encounter (HOSPITAL_BASED_OUTPATIENT_CLINIC_OR_DEPARTMENT_OTHER): Payer: Self-pay | Admitting: Urology

## 2023-01-05 DIAGNOSIS — N401 Enlarged prostate with lower urinary tract symptoms: Secondary | ICD-10-CM | POA: Diagnosis not present

## 2023-01-05 MED ORDER — CEFAZOLIN SODIUM-DEXTROSE 1-4 GM/50ML-% IV SOLN
INTRAVENOUS | Status: AC
  Filled 2023-01-05: qty 50

## 2023-01-05 NOTE — Discharge Summary (Signed)
Date of admission: 01/04/2023  Date of discharge: 01/05/2023  Admission diagnosis: BPH with LUTS  Discharge diagnosis: BPH with LUTS  Surgery: TURP  History and Physical: For full details, please see admission history and physical. Briefly, Tristan Hamilton is a 72 y.o. year old patient with BPH with refractory LUTS despite medical therapy and urolift.   Hospital Course: Routine post-op course following TURP  Physical Exam:  General: Alert and oriented CV: RRR, palpable distal pulses Lungs: CTAB, equal chest rise Abdomen: Soft, NTND, no rebound or guarding GU:  Foley draining clear urine w/o CBI Ext: NT, No erythema  Laboratory values: No results for input(s): "HGB", "HCT" in the last 72 hours. No results for input(s): "CREATININE" in the last 72 hours.  Disposition: Home  Discharge instruction: The patient was instructed to be ambulatory but told to refrain from heavy lifting, strenuous activity, or driving.  Discharge medications:  Allergies as of 01/05/2023       Reactions   Crestor [rosuvastatin]    myalgia   Lipitor [atorvastatin]    myalgia   Nexletol [bempedoic Acid]    Muscle aches        Medication List     TAKE these medications    cephALEXin 500 MG capsule Commonly known as: KEFLEX Take 1 capsule (500 mg total) by mouth 2 (two) times daily for 3 days. Start taking on 01/08/2023   Cholecalciferol 125 MCG (5000 UT) capsule Take 5,000 Units by mouth daily.   cimetidine 200 MG tablet Commonly known as: Cimetidine 200 Take 1 tablet (200 mg total) by mouth 2 (two) times daily as needed. Pt isn't sure of the dosage What changed: when to take this   CLEAR EYES COMPLETE OP Apply to eye as needed.   Coenzyme Q10 100 MG Tabs Take 1 tablet by mouth daily. Takes 200 mg   DHEA 50 MG Tabs Take 1 tablet by mouth daily.   inclisiran 284 MG/1.5ML Sosy injection Commonly known as: LEQVIO Inject 284 mg into the skin once. Last injection 11/29/22.   oxybutynin  5 MG tablet Commonly known as: DITROPAN Take 1 tablet (5 mg total) by mouth every 8 (eight) hours as needed for bladder spasms.   phenazopyridine 200 MG tablet Commonly known as: Pyridium Take 1 tablet (200 mg total) by mouth 3 (three) times daily as needed (for pain with urination).   polyethylene glycol 17 g packet Commonly known as: MIRALAX / GLYCOLAX Take 17 g by mouth once. Taken for 3 days prior to procedure on 01/04/2023 per patient.   Rapaflo 8 MG Caps capsule Generic drug: silodosin Take 8 mg by mouth daily with breakfast.   traMADol 50 MG tablet Commonly known as: Ultram Take 1 tablet (50 mg total) by mouth every 6 (six) hours as needed for up to 5 days.   vitamin B-12 50 MCG tablet Commonly known as: CYANOCOBALAMIN Take 50 mcg by mouth daily.        Followup:   Follow-up Information     [facility] Follow up on 01/09/2023.   Why: Postop appointment at 10:30 AM for catheter removal Contact information: [place] [phone]

## 2023-01-08 LAB — SURGICAL PATHOLOGY

## 2023-01-30 ENCOUNTER — Ambulatory Visit: Payer: Medicare Other | Admitting: Family Medicine

## 2023-02-01 ENCOUNTER — Ambulatory Visit (INDEPENDENT_AMBULATORY_CARE_PROVIDER_SITE_OTHER): Payer: Medicare Other | Admitting: Family Medicine

## 2023-02-01 ENCOUNTER — Encounter: Payer: Self-pay | Admitting: Family Medicine

## 2023-02-01 VITALS — BP 132/80 | HR 86 | Temp 98.2°F | Ht 70.0 in | Wt 200.7 lb

## 2023-02-01 DIAGNOSIS — R739 Hyperglycemia, unspecified: Secondary | ICD-10-CM | POA: Diagnosis not present

## 2023-02-01 DIAGNOSIS — E782 Mixed hyperlipidemia: Secondary | ICD-10-CM

## 2023-02-01 DIAGNOSIS — R7303 Prediabetes: Secondary | ICD-10-CM

## 2023-02-01 LAB — LIPID PANEL
Cholesterol: 121 mg/dL (ref 0–200)
HDL: 46.4 mg/dL (ref >39.00)
LDL Cholesterol: 53 mg/dL (ref 0–99)
NonHDL: 74.16
Total CHOL/HDL Ratio: 3
Triglycerides: 104 mg/dL (ref 0.0–149.0)
VLDL: 20.8 mg/dL (ref 0.0–40.0)

## 2023-02-01 LAB — POCT GLYCOSYLATED HEMOGLOBIN (HGB A1C): Hemoglobin A1C: 5.6 % (ref 4.0–5.6)

## 2023-02-01 NOTE — Progress Notes (Unsigned)
Established Patient Office Visit  Subjective   Patient ID: Tristan Hamilton, male    DOB: [birth year]  Age: 72 y.o. MRN: [id number]  Chief Complaint  Patient presents with   Medical Management of Chronic Issues   Follow-up    Patient states he was seen at an urgent care 3 days ago, diagnosed with bronchitis or walking pneumonia, given Prednisone 57m and Doxycycline    Patient is here for 6 month follow up today on his prediabetes.  Acute bronchitis-- pt reports he was diagnosed with this about 3 days ago at urgent care, was given prednisone and doxycycline, states that today he is starting to improve-- less coughing and less congestion. No fever/chills.  Prediabetes-- A1C performed today in office and is 5.6, pt reports no changes since the last visit.   HLD-- patinet sees Dr. SGardiner Rhymefor this and gets injections to help lower his cholesterol.     Current Outpatient Medications  Medication Instructions   Cholecalciferol 5,000 Units, Oral, Daily   cimetidine (CIMETIDINE 200) 200 mg, Oral, 2 times daily PRN, Pt isn't sure of the dosage   Coenzyme Q10 100 MG TABS 1 tablet, Oral, Daily, Takes 200 mg   DHEA 50 MG TABS 1 tablet, Oral, Daily   doxycycline (VIBRA-TABS) 100 mg, Oral, 2 times daily   Hyprom-Naphaz-Polysorb-Zn Sulf (CLEAR EYES COMPLETE OP) Ophthalmic, As needed   inclisiran (LEQVIO) 284 mg, Subcutaneous,  Once, Last injection 11/29/22.   polyethylene glycol (MIRALAX / GLYCOLAX) 17 g, Oral,  Once, Taken for 3 days prior to procedure on 01/04/2023 per patient.    predniSONE (DELTASONE) 5 mg, Oral, Daily   silodosin (RAPAFLO) 8 mg, Oral, Daily with breakfast   vitamin B-12 (CYANOCOBALAMIN) 50 mcg, Oral, Daily     Patient Active Problem List   Diagnosis Date Noted   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   TFCC (triangular fibrocartilage complex) injury, right, initial encounter 09/22/2022   Prediabetes 07/29/2022   Bilateral carotid artery stenosis  07/06/2022   Ganglion cyst 06/09/2022   Back abscess 06/09/2022   Bilateral wrist pain 02/14/2022   Bilateral shoulder pain 02/14/2022   Myalgia 01/03/2022   Hand cramps 01/03/2022   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     Objective:     BP 132/80 (BP Location: Left Arm, Patient Position: Sitting, Cuff Size: Large)   Pulse 86   Temp 98.2 F (36.8 C) (Oral)   Ht 5' 10"$  (1.778 m)   Wt 200 lb 11.2 oz (91 kg)   SpO2 96%   BMI 28.80 kg/m  {Vitals History (Optional):23777}  Physical Exam Vitals reviewed.  Constitutional:      Appearance: Normal appearance. He is well-groomed and normal weight.  Eyes:     Extraocular Movements: Extraocular movements intact.     Conjunctiva/sclera: Conjunctivae normal.  Neck:     Thyroid: No thyromegaly.  Cardiovascular:     Rate and Rhythm: Normal rate and regular rhythm.     Heart sounds: S1 normal and S2 normal. No murmur heard. Pulmonary:     Effort: Pulmonary effort is normal.  Breath sounds: Normal breath sounds and air entry. No rales.  Abdominal:     General: Bowel sounds are normal.  Musculoskeletal:     Right lower leg: No edema.     Left lower leg: No edema.  Neurological:     General: No focal deficit present.     Mental Status: He is alert and oriented to person, place, and time.     Gait: Gait is intact.  Psychiatric:        Mood and Affect: Mood and affect normal.      Results for orders placed or performed in visit on 02/01/23  POC HgB A1c  Result Value Ref Range   Hemoglobin A1C 5.6 4.0 - 5.6 %   HbA1c POC (<> result,  manual entry)     HbA1c, POC (prediabetic range)     HbA1c, POC (controlled diabetic range)      {Labs (Optional):23779}  The 10-year ASCVD risk score (Arnett DK, et al., 2019) is: 31.6%    Assessment & Plan:   Problem List Items Addressed This Visit       Unprioritized   Hyperlipidemia   Relevant Orders   Lipid Panel   Other Visit Diagnoses     Hyperglycemia    -  Primary   Relevant Orders   POC HgB A1c (Completed)       No follow-ups on file.    Farrel Conners, MD

## 2023-02-02 NOTE — Assessment & Plan Note (Signed)
Sees Dr. Gardiner Rhyme for the injectable medication due to statin myopathy. Needs new lipid panel today.

## 2023-02-02 NOTE — Assessment & Plan Note (Signed)
A1C is back in the normal range with the reduction in prednisone, will continue to monitor every 6 months. Medications not indicated at this time.

## 2023-02-15 ENCOUNTER — Telehealth: Payer: Self-pay | Admitting: Family Medicine

## 2023-02-15 DIAGNOSIS — J069 Acute upper respiratory infection, unspecified: Secondary | ICD-10-CM

## 2023-02-15 MED ORDER — METHYLPREDNISOLONE 4 MG PO TBPK: ORAL_TABLET | ORAL | 0 refills | Status: DC

## 2023-02-15 NOTE — Telephone Encounter (Signed)
Pt was seen on 2-14 for cough and pt is still having sinus drainage and would like rx sent to pharm to clear him up. Pt does not want to make an appt. Pt is leaving town tomorrow. [place] [address] [facility], [place] Phone: [phone]  Fax: [phone]

## 2023-02-16 NOTE — Telephone Encounter (Signed)
Patient informed of the message below.

## 2023-02-22 ENCOUNTER — Encounter: Payer: Self-pay | Admitting: Family Medicine

## 2023-02-23 ENCOUNTER — Ambulatory Visit (HOSPITAL_BASED_OUTPATIENT_CLINIC_OR_DEPARTMENT_OTHER)
Admission: RE | Admit: 2023-02-23 | Discharge: 2023-02-23 | Disposition: A | Discharge status: Home / Self Care | Payer: Medicare Other | Source: Ambulatory Visit | Attending: Family Medicine | Admitting: Family Medicine

## 2023-02-23 ENCOUNTER — Ambulatory Visit (INDEPENDENT_AMBULATORY_CARE_PROVIDER_SITE_OTHER): Payer: Medicare Other | Admitting: Family Medicine

## 2023-02-23 ENCOUNTER — Encounter: Payer: Self-pay | Admitting: Family Medicine

## 2023-02-23 VITALS — BP 120/68 | HR 65 | Temp 98.0°F | Ht 70.0 in | Wt 201.9 lb

## 2023-02-23 DIAGNOSIS — J189 Pneumonia, unspecified organism: Secondary | ICD-10-CM | POA: Diagnosis present

## 2023-02-23 DIAGNOSIS — R093 Abnormal sputum: Secondary | ICD-10-CM | POA: Diagnosis not present

## 2023-02-23 DIAGNOSIS — R059 Cough, unspecified: Secondary | ICD-10-CM | POA: Diagnosis not present

## 2023-02-23 MED ORDER — AZITHROMYCIN 250 MG PO TABS: ORAL_TABLET | ORAL | 0 refills | Status: DC

## 2023-02-23 MED ORDER — AMOXICILLIN-POT CLAVULANATE 875-125 MG PO TABS: 1.0000 | ORAL_TABLET | Freq: Two times a day (BID) | ORAL | 0 refills | Status: DC

## 2023-02-23 NOTE — Progress Notes (Signed)
   Acute Office Visit  Subjective:     Patient ID: Tristan Hamilton, male    DOB: 01-02-1951, 72 y.o.   MRN: [id number]  Chief Complaint  Patient presents with   Cough    Productive with white sputum x3 months, completed dosepak 3 days ago and Claritin with no relief    Cough   Patient is in today for recurrent cough with sputum production. I saw the patient on 02/01/23 and he was told that he had acute bronchitis, was treated with doxycycline and prednisone. He was on day 3 of treatment at the time. Over the next three weeks he continued to have coughing and mucus production. I had prescribed a medrol dose pak for him on 02/15/23 which he completed, however the coughing and mucus production remained. He denies any chest pain, fever/chills or difficulty breathing. States that maybe the steroids helped a little but over the course of the day the coughing worsens and the mucus production worsens.   Review of Systems  Respiratory:  Positive for cough.   All other systems reviewed and are negative.       Objective:    BP 120/68 (BP Location: Left Arm, Patient Position: Sitting, Cuff Size: Normal)   Pulse 65   Temp 98 F (36.7 C) (Oral)   Ht '5\' 10"'$  (1.778 m)   Wt 201 lb 14.4 oz (91.6 kg)   SpO2 95%   BMI 28.97 kg/m    Physical Exam Constitutional:      Appearance: Normal appearance. He is normal weight.  Cardiovascular:     Rate and Rhythm: Normal rate and regular rhythm.     Pulses: Normal pulses.     Heart sounds: Normal heart sounds.  Pulmonary:     Breath sounds: Transmitted upper airway sounds present. Examination of the right-lower field reveals decreased breath sounds, rhonchi and rales. Decreased breath sounds, rhonchi and rales present. No wheezing.  Neurological:     Mental Status: He is alert.     No results found for any visits on 02/23/23.      Assessment & Plan:   Problem List Items Addressed This Visit   None Visit Diagnoses     Pneumonia of right  lower lobe due to infectious organism    -  Primary   Relevant Medications   amoxicillin-clavulanate (AUGMENTIN) 875-125 MG tablet   azithromycin (ZITHROMAX Z-PAK) 250 MG tablet   Other Relevant Orders   DG Chest 2 View      Significant changes in the lung sounds, especially in the right lower lobe. I am highly suspicious of community acquired pneumonia. I will order a CXR to confirm and will treat with augmentin and zithromax for a full course. Further testing TBD after the results of the CXR are available for review. Meds ordered this encounter  Medications   amoxicillin-clavulanate (AUGMENTIN) 875-125 MG tablet    Sig: Take 1 tablet by mouth 2 (two) times daily.    Dispense:  20 tablet    Refill:  0   azithromycin (ZITHROMAX Z-PAK) 250 MG tablet    Sig: 2 tablets on day 1, then 1 tablet daily for 4 days.    Dispense:  6 each    Refill:  0    No follow-ups on file.  Farrel Conners, MD

## 2023-02-28 ENCOUNTER — Ambulatory Visit (INDEPENDENT_AMBULATORY_CARE_PROVIDER_SITE_OTHER): Payer: Medicare Other

## 2023-02-28 VITALS — BP 149/77 | HR 57 | Temp 97.9°F | Resp 18 | Ht 70.0 in | Wt 206.2 lb

## 2023-02-28 DIAGNOSIS — I6523 Occlusion and stenosis of bilateral carotid arteries: Secondary | ICD-10-CM | POA: Diagnosis not present

## 2023-02-28 DIAGNOSIS — E782 Mixed hyperlipidemia: Secondary | ICD-10-CM

## 2023-02-28 MED ORDER — INCLISIRAN SODIUM 284 MG/1.5ML ~~LOC~~ SOSY
284.0000 mg | PREFILLED_SYRINGE | Freq: Once | SUBCUTANEOUS | Status: AC
  Administered 2023-02-28: 284 mg via SUBCUTANEOUS
  Filled 2023-02-28: qty 1.5

## 2023-02-28 NOTE — Progress Notes (Signed)
Diagnosis: Hyperlipidemia  Provider:  Marshell Garfinkel MD  Procedure: Injection  Leqvio (inclisiran), Dose: 284 mg, Site: subcutaneous, Number of injections: 1  Post Care:  n/a  Discharge: Condition: Good, Destination: Home . AVS Declined  Performed by:  Cleophus Molt, RN

## 2023-03-08 ENCOUNTER — Encounter: Payer: Self-pay | Admitting: Family Medicine

## 2023-03-08 DIAGNOSIS — R062 Wheezing: Secondary | ICD-10-CM

## 2023-03-08 MED ORDER — BUDESONIDE-FORMOTEROL FUMARATE 160-4.5 MCG/ACT IN AERO: 2.0000 | INHALATION_SPRAY | Freq: Two times a day (BID) | RESPIRATORY_TRACT | 3 refills | Status: AC

## 2023-03-08 MED ORDER — ALBUTEROL SULFATE HFA 108 (90 BASE) MCG/ACT IN AERS: 2.0000 | INHALATION_SPRAY | Freq: Four times a day (QID) | RESPIRATORY_TRACT | 0 refills | Status: AC | PRN

## 2023-03-13 ENCOUNTER — Encounter: Payer: Self-pay | Admitting: Family Medicine

## 2023-03-24 ENCOUNTER — Ambulatory Visit (INDEPENDENT_AMBULATORY_CARE_PROVIDER_SITE_OTHER): Payer: Medicare Other | Admitting: Family Medicine

## 2023-03-24 ENCOUNTER — Encounter: Payer: Self-pay | Admitting: Family Medicine

## 2023-03-24 VITALS — BP 118/76 | HR 89 | Temp 98.7°F | Resp 16 | Ht 70.0 in | Wt 197.8 lb

## 2023-03-24 DIAGNOSIS — J069 Acute upper respiratory infection, unspecified: Secondary | ICD-10-CM | POA: Diagnosis not present

## 2023-03-24 DIAGNOSIS — J029 Acute pharyngitis, unspecified: Secondary | ICD-10-CM

## 2023-03-24 DIAGNOSIS — R051 Acute cough: Secondary | ICD-10-CM

## 2023-03-24 LAB — POCT RAPID STREP A (OFFICE): Rapid Strep A Screen: NEGATIVE

## 2023-03-24 LAB — POC COVID19 BINAXNOW: SARS Coronavirus 2 Ag: NEGATIVE

## 2023-03-24 NOTE — Patient Instructions (Addendum)
A few things to remember from today's visit:  Sore throat - Plan: POC COVID-19, POC Rapid Strep A  URI, acute  Acute cough  Throat lozenges and Tylenol 500 mg 3-4 times per day. Check temperature. Will follow throat culture.  If you need refills for medications you take chronically, please call your pharmacy. Do not use My Chart to request refills or for acute issues that need immediate attention. If you send a my chart message, it may take a few days to be addressed, specially if I am not in the office.  Please be sure medication list is accurate. If a new problem present, please set up appointment sooner than planned today.

## 2023-03-24 NOTE — Progress Notes (Signed)
ACUTE VISIT Chief Complaint  Patient presents with   Cough    Patient complains of productive cough, x3 days, Productive cough    Sore Throat    Patient complains of sore throat, x3 days   Fever   HPI: Mr.Tristan Hamilton is a 72 y.o. male, who is here today complaining of respiratory symptoms as described above.  Cough Associated symptoms include chills, a fever, headaches, nasal congestion, postnasal drip, rhinorrhea and a sore throat. Pertinent negatives include no chest pain, ear congestion, ear pain, heartburn, hemoptysis, myalgias or rash. Nothing aggravates the symptoms. He has tried steroid inhaler for the symptoms. There is no history of asthma or environmental allergies.  He reports that his grandson had strep throat two weeks ago.  He experienced subjective fever yesterday accompanied by chills and sweating.  He also describes nasal congestion and a "slight" frontal headache.   However, he denies having body aches, nausea, vomiting, diarrhea, skin rash.  When coughing, he occasionally produces sputum but does not experience wheezing or difficulty breathing.  She has a history of a continuous cough and was seen by PCP a month ago. He was prescribed antibiotics (Azithromycin and Augmentin) and an Symbicort inhaler; cough greatly improved.   He has been taking Tylenol for fever management and is currently using Fisherman's Friend lozenges for his cough.  Review of Systems  Constitutional:  Positive for chills and fever.  HENT:  Positive for postnasal drip, rhinorrhea and sore throat. Negative for ear pain and trouble swallowing.   Respiratory:  Positive for cough. Negative for hemoptysis.   Cardiovascular:  Negative for chest pain.  Gastrointestinal:  Negative for abdominal pain, heartburn, nausea and vomiting.  Genitourinary:  Negative for decreased urine volume, dysuria and hematuria.  Musculoskeletal:  Negative for myalgias.  Skin:  Negative for rash.   Allergic/Immunologic: Negative for environmental allergies.  Neurological:  Positive for headaches.  Hematological:  Negative for adenopathy. Does not bruise/bleed easily.  See other pertinent positives and negatives in HPI.  Current Outpatient Medications on File Prior to Visit  Medication Sig Dispense Refill   albuterol (VENTOLIN HFA) 108 (90 Base) MCG/ACT inhaler Inhale 2 puffs into the lungs every 6 (six) hours as needed for wheezing or shortness of breath. 8 g 0   azithromycin (ZITHROMAX Z-PAK) 250 MG tablet 2 tablets on day 1, then 1 tablet daily for 4 days. 6 each 0   budesonide-formoterol (SYMBICORT) 160-4.5 MCG/ACT inhaler Inhale 2 puffs into the lungs 2 (two) times daily. 1 each 3   Cholecalciferol 125 MCG (5000 UT) capsule Take 5,000 Units by mouth daily.      cimetidine (CIMETIDINE 200) 200 MG tablet Take 1 tablet (200 mg total) by mouth 2 (two) times daily as needed. Pt isn't sure of the dosage (Patient taking differently: Take 200 mg by mouth as needed. Pt isn't sure of the dosage) 60 tablet 2   Coenzyme Q10 100 MG TABS Take 1 tablet by mouth daily. Takes 200 mg     DHEA 50 MG TABS Take 1 tablet by mouth daily.     Hyprom-Naphaz-Polysorb-Zn Sulf (CLEAR EYES COMPLETE OP) Apply to eye as needed.     inclisiran (LEQVIO) 284 MG/1.5ML SOSY injection Inject 284 mg into the skin once. Last injection 11/29/22.     vitamin B-12 (CYANOCOBALAMIN) 50 MCG tablet Take 50 mcg by mouth daily.     No current facility-administered medications on file prior to visit.   Past Medical History:  Diagnosis Date  Ascending aorta dilation    last CTA 07-29-2022  3.7cm   Asymptomatic bilateral carotid artery stenosis    doppler 06-22-2022 bilateral ICA 1-39%   Bladder calculi    BPH (benign prostatic hyperplasia)    Cancer    prostate cancer in 2012   Coronary artery calcification seen on CT scan    cardiologist--- dr c. Georgia Lopes;   coronary calcium score CT 07-27-2022 (237)   GERD  (gastroesophageal reflux disease)    Heart murmur    no problems   History of DVT of lower extremity 1975   LLE  post op knee arthroscopy right side ;   treated with blood thinner,  per pt no clot before or since 1975   History of kidney stones    History of migraine    h/o of ocular migranes   History of prostate cancer    urologist--- dr winter;  per dr winter note dx 2011 w/ Gleason 6 in [place] , [place];  active survillance  (09-16-2022  per pt last bx done did not show any cancer)   Hyperlipidemia, mixed    Myalgia, multiple sites    09-16-2022 per pt chronic multiple site ,  intermittant since taking statin medication's,  takes prednisone daily   Osteoarthritis of both knees    Pre-diabetes    07/29/22 Hgb A1c 5.9   Sinus bradycardia seen on cardiac monitor 02/24/2020   lowest heart rate 35 w/ occasional PVCs / PACs   Urinary retention 10/01/2022   Patient has been daily self catherizing since 10/01/22, as of 01/02/23.   Wears dentures    upper   Wears glasses    mainly for reading   Allergies  Allergen Reactions   Crestor [Rosuvastatin]     myalgia   Lipitor [Atorvastatin]     myalgia   Nexletol [Bempedoic Acid]     Muscle aches    Social History   Socioeconomic History   Marital status: Married    Spouse name: Not on file   Number of children: Not on file   Years of education: Not on file   Highest education level: Some college, no degree  Occupational History   Not on file  Tobacco Use   Smoking status: Never   Smokeless tobacco: Never  Vaping Use   Vaping Use: Never used  Substance and Sexual Activity   Alcohol use: Not Currently    Comment: wine very rarely   Drug use: Never   Sexual activity: Not on file    Comment: vasectomy  Other Topics Concern   Not on file  Social History Narrative   Not on file   Social Determinants of Health   Financial Resource Strain: Low Risk  (03/23/2023)   Overall Financial Resource Strain (CARDIA)    Difficulty of  Paying Living Expenses: Not hard at all  Food Insecurity: No Food Insecurity (03/23/2023)   Hunger Vital Sign    Worried About Running Out of Food in the Last Year: Never true    Ran Out of Food in the Last Year: Never true  Transportation Needs: No Transportation Needs (03/23/2023)   PRAPARE - Administrator, Civil Service (Medical): No    Lack of Transportation (Non-Medical): No  Physical Activity: Unknown (03/23/2023)   Exercise Vital Sign    Days of Exercise per Week: Patient declined    Minutes of Exercise per Session: 0 min  Stress: No Stress Concern Present (03/23/2023)   [employer] of Occupational  Health - Occupational Stress Questionnaire    Feeling of Stress : Not at all  Social Connections: Socially Integrated (03/23/2023)   Social Connection and Isolation Panel [NHANES]    Frequency of Communication with Friends and Family: More than three times a week    Frequency of Social Gatherings with Friends and Family: More than three times a week    Attends Religious Services: More than 4 times per year    Active Member of [employer] or Organizations: Yes    Attends BankerClub or Organization Meetings: More than 4 times per year    Marital Status: Married   Vitals:   03/24/23 1135  BP: 118/76  Pulse: 89  Resp: 16  Temp: 98.7 F (37.1 C)  SpO2: 98%   Body mass index is 28.38 kg/m.  Physical Exam Vitals and nursing note reviewed.  Constitutional:      General: He is not in acute distress.    Appearance: He is well-developed. He is not ill-appearing.  HENT:     Head: Normocephalic and atraumatic.     Right Ear: Tympanic membrane, ear canal and external ear normal.     Left Ear: Tympanic membrane, ear canal and external ear normal.     Nose: Rhinorrhea present. No congestion.     Right Turbinates: Enlarged.     Left Turbinates: Enlarged.     Mouth/Throat:     Mouth: Mucous membranes are moist.     Pharynx: Posterior oropharyngeal erythema present. No pharyngeal  swelling or oropharyngeal exudate.     Tonsils: No tonsillar exudate.  Eyes:     Conjunctiva/sclera: Conjunctivae normal.  Cardiovascular:     Rate and Rhythm: Normal rate and regular rhythm.     Heart sounds: No murmur heard. Pulmonary:     Effort: Pulmonary effort is normal. No respiratory distress.     Breath sounds: Normal breath sounds. No stridor.  Lymphadenopathy:     Head:     Right side of head: No submandibular adenopathy.     Left side of head: No submandibular adenopathy.     Cervical: No cervical adenopathy.  Skin:    General: Skin is warm.     Findings: No erythema or rash.  Neurological:     Mental Status: He is alert and oriented to person, place, and time.  Psychiatric:        Mood and Affect: Mood and affect normal.   ASSESSMENT AND PLAN:  [name] was seen today for sore throat and cough.  Sore throat Hx and examination are not suggestive of strep pharyngitis. Rapid strep negative. Will follow throat Cx. Continue throat lozenges.  -     POC COVID-19 BinaxNow -     POCT rapid strep A  URI, acute Symptoms suggests a viral etiology, I explained patient that symptomatic treatment is usually recommended in this case, so I do not think abx is needed at this time. Instructed to monitor for signs of complications, monitor temperature. Instructed about warning signs. F/U as needed.  Acute cough Explained that cough and nasal congestion can last a few days and sometimes weeks. Lung auscultation negative, I do not think imaging is needed at this time. OTC plain Mucinex and adequate hydration recommended.  Return if symptoms worsen or fail to improve.  Onelia Cadmus G. [place]az[place], MD  [facility]. [place] office.

## 2023-03-27 ENCOUNTER — Encounter: Payer: Self-pay | Admitting: Family Medicine

## 2023-03-27 DIAGNOSIS — R053 Chronic cough: Secondary | ICD-10-CM

## 2023-03-27 LAB — CULTURE, UPPER RESPIRATORY
MICRO NUMBER:: 14788416
SPECIMEN QUALITY:: ADEQUATE

## 2023-03-30 NOTE — Progress Notes (Signed)
Cardiology Office Note:    Date:  03/31/2023   ID:  Tristan Hamilton, DOB 09/16/1951, MRN [id number]  PCP:  Karie Georges, MD  Cardiologist:  Little Ishikawa, MD  Electrophysiologist:  None   Referring MD: Karie Georges, MD   Chief Complaint  Patient presents with   Bradycardia    History of Present Illness:    Tristan Hamilton is a 72 y.o. male with a hx of prostate cancer, DVT who presents for follow-up.  He was referred by Dr. Hassan Rowan for evaluation of bradycardia, initially seen on 03/11/2020.  He was seen by Dr. Hassan Rowan for preop evaluation prior to bilateral total knee replacements.  He reported palpitations and Zio patch x2 days was ordered.  Zio patch showed sinus bradycardia down to heart rate 35 bpm.  Patient reports that he is very active.  Recently moved to [place] and has been doing multiple projects on his house and his daughter's house.  Has been cutting down trees and carrying wood.  He denies any exertional chest pain or dyspnea.  He can walk up 2 flights of stairs without stopping.  Reports that the symptoms he felt when he wore his monitor were that his heart was pounding.  He denies any lightheadedness, syncope or presyncope, chest pain, or dyspnea.  He has a history of an incidental DVT that was found in his left knee years ago, he states that he took a blood thinner for 6 months or so, and has not had recurrence.  No smoking history.  No history of heart disease in his immediate family.  TTE was done on 03/12/2020, which showed normal biventricular function, no significant valvular disease.  Calcium score on 07/27/2021 was 237 (59th percentile).  Also notable for dilated ascending aorta measuring 40 mm  Since last clinic visit, he reports that he is doing well.  Main issue recently has been cough, has been persistent since December.  He has appointment with pulmonology next month.  Denies any chest pain, dyspnea, lightheadedness, syncope, lower extremity  edema, or palpitations.  Reports he has not been exercising regularly.  BP Readings from Last 3 Encounters:  03/31/23 122/84  03/24/23 118/76  02/28/23 (!) 149/77      Past Medical History:  Diagnosis Date   Ascending aorta dilation    last CTA 07-29-2022  3.7cm   Asymptomatic bilateral carotid artery stenosis    doppler 06-22-2022 bilateral ICA 1-39%   Bladder calculi    BPH (benign prostatic hyperplasia)    Cancer    prostate cancer in 2012   Coronary artery calcification seen on CT scan    cardiologist--- dr c. Georgia Lopes;   coronary calcium score CT 07-27-2022 (237)   GERD (gastroesophageal reflux disease)    Heart murmur    no problems   History of DVT of lower extremity 1975   LLE  post op knee arthroscopy right side ;   treated with blood thinner,  per pt no clot before or since 1975   History of kidney stones    History of migraine    h/o of ocular migranes   History of prostate cancer    urologist--- dr winter;  per dr winter note dx 2011 w/ Gleason 6 in [place] , [place];  active survillance  (09-16-2022  per pt last bx done did not show any cancer)   Hyperlipidemia, mixed    Myalgia, multiple sites    09-16-2022 per pt chronic multiple site ,  intermittant since  taking statin medication's,  takes prednisone daily   Osteoarthritis of both knees    Pre-diabetes    07/29/22 Hgb A1c 5.9   Sinus bradycardia seen on cardiac monitor 02/24/2020   lowest heart rate 35 w/ occasional PVCs / PACs   Urinary retention 10/01/2022   Patient has been daily self catherizing since 10/01/22, as of 01/02/23.   Wears dentures    upper   Wears glasses    mainly for reading    Past Surgical History:  Procedure Laterality Date   CYSTOSCOPY WITH INSERTION OF UROLIFT  2012   CYSTOSCOPY WITH LITHOLAPAXY N/A 09/28/2022   Procedure: CYSTOSCOPY WITH LITHOLAPAXY/ BILATERAL RETROGRADE PYELOGRAM;  Surgeon: Rene Paci, MD;  Location: [facility];  Service:  Urology;  Laterality: N/A;   KNEE ARTHROSCOPY Right 1975   ORCHIECTOMY Left    child   TONSILLECTOMY     child   TOTAL KNEE ARTHROPLASTY Bilateral 03/24/2020   Procedure: TOTAL KNEE BILATERAL;  Surgeon: Kathryne Hitch, MD;  Location: [facility] OR;  Service: Orthopedics;  Laterality: Bilateral;   TRANSURETHRAL RESECTION OF PROSTATE N/A 01/04/2023   Procedure: TRANSURETHRAL RESECTION OF THE PROSTATE (TURP);  Surgeon: Rene Paci, MD;  Location[facility];  Service: Urology;  Laterality: N/A;    Current Medications: Current Meds  Medication Sig   albuterol (VENTOLIN HFA) 108 (90 Base) MCG/ACT inhaler Inhale 2 puffs into the lungs every 6 (six) hours as needed for wheezing or shortness of breath.   budesonide-formoterol (SYMBICORT) 160-4.5 MCG/ACT inhaler Inhale 2 puffs into the lungs 2 (two) times daily.   Cholecalciferol 125 MCG (5000 UT) capsule Take 5,000 Units by mouth daily.    cimetidine (CIMETIDINE 200) 200 MG tablet Take 1 tablet (200 mg total) by mouth 2 (two) times daily as needed. Pt isn't sure of the dosage (Patient taking differently: Take 200 mg by mouth as needed. Pt isn't sure of the dosage)   Coenzyme Q10 100 MG TABS Take 1 tablet by mouth daily. Takes 200 mg   DHEA 50 MG TABS Take 1 tablet by mouth daily.   Hyprom-Naphaz-Polysorb-Zn Sulf (CLEAR EYES COMPLETE OP) Apply to eye as needed.   inclisiran (LEQVIO) 284 MG/1.5ML SOSY injection Inject 284 mg into the skin once. Last injection 11/29/22.   vitamin B-12 (CYANOCOBALAMIN) 50 MCG tablet Take 50 mcg by mouth daily.   [DISCONTINUED] azithromycin (ZITHROMAX Z-PAK) 250 MG tablet 2 tablets on day 1, then 1 tablet daily for 4 days.     Allergies:   Crestor [rosuvastatin], Lipitor [atorvastatin], and Nexletol [bempedoic acid]   Social History   Socioeconomic History   Marital status: Married    Spouse name: Not on file   Number of children: Not on file   Years of education: Not on file    Highest education level: Some college, no degree  Occupational History   Not on file  Tobacco Use   Smoking status: Never   Smokeless tobacco: Never  Vaping Use   Vaping Use: Never used  Substance and Sexual Activity   Alcohol use: Not Currently    Comment: wine very rarely   Drug use: Never   Sexual activity: Not on file    Comment: vasectomy  Other Topics Concern   Not on file  Social History Narrative   Not on file   Social Determinants of Health   Financial Resource Strain: Low Risk  (03/23/2023)   Overall Financial Resource Strain (CARDIA)    Difficulty of  Paying Living Expenses: Not hard at all  Food Insecurity: No Food Insecurity (03/23/2023)   Hunger Vital Sign    Worried About Running Out of Food in the Last Year: Never true    Ran Out of Food in the Last Year: Never true  Transportation Needs: No Transportation Needs (03/23/2023)   PRAPARE - Administrator, Civil Service (Medical): No    Lack of Transportation (Non-Medical): No  Physical Activity: Unknown (03/23/2023)   Exercise Vital Sign    Days of Exercise per Week: Patient declined    Minutes of Exercise per Session: 0 min  Stress: No Stress Concern Present (03/23/2023)   [employer] of Occupational Health - Occupational Stress Questionnaire    Feeling of Stress : Not at all  Social Connections: Socially Integrated (03/23/2023)   Social Connection and Isolation Panel [NHANES]    Frequency of Communication with Friends and Family: More than three times a week    Frequency of Social Gatherings with Friends and Family: More than three times a week    Attends Religious Services: More than 4 times per year    Active Member of [employer] or Organizations: Yes    Attends Engineer, structural: More than 4 times per year    Marital Status: Married     Family History: The patient's family history includes Diverticulitis in his mother; Penile cancer in his father; Prostate cancer in his maternal  grandfather; Stroke (age of onset: 56) in his sister. There is no history of Colon cancer, Colon polyps, Esophageal cancer, Rectal cancer, or Stomach cancer.  ROS:   Please see the history of present illness.      All other systems reviewed and are negative.  EKGs/Labs/Other Studies Reviewed:    The following studies were reviewed today:   EKG:   01/24: Sinus rhythm, rate 66 07/22: sinus bradycardia, rate 47, no ST abnomalities 05/21: sinus bradycardia, rate 55, no ST/T nabormalities  TTE 03/12/20:   1. Left ventricular ejection fraction, by estimation, is 60 to 65%. The  left ventricle has normal function. The left ventricle has no regional  wall motion abnormalities. There is mild left ventricular hypertrophy.  Left ventricular diastolic parameters  were normal.   2. Right ventricular systolic function is normal. The right ventricular  size is normal. There is mildly elevated pulmonary artery systolic  pressure. The estimated right ventricular systolic pressure is 35.5 mmHg.   3. The mitral valve is normal in structure. Trivial mitral valve  regurgitation.   4. The aortic valve is tricuspid. Aortic valve regurgitation is trivial.  Mild aortic valve sclerosis is present, with no evidence of aortic valve  stenosis.   5. The inferior vena cava is normal in size with <50% respiratory  variability, suggesting right atrial pressure of 8 mmHg.   Cardiac monitor 02/24/20: Sinus bradycardia, normal sinus rhythm and sinus tachycardia. The average heart rate was 63 bpm and ranged from 35-137 Occasional PVCs, bigeminal PVCs, trigeminal PVCs Occasional PACs and nonsustained atrial tachycardia up to 4 beats. Patient symptomatic during bradycardia with HR as low as 35bpm during awake hours.    Recent Labs: 10/01/2022: BUN 13; Creatinine, Ser 0.96; Hemoglobin 16.6; Platelets 210; Potassium 3.8; Sodium 134  Recent Lipid Panel    Component Value Date/Time   CHOL 121 02/01/2023 1121    CHOL 137 02/15/2022 0919   TRIG 104.0 02/01/2023 1121   HDL 46.40 02/01/2023 1121   HDL 45 02/15/2022 0919   CHOLHDL 3 02/01/2023  1121   VLDL 20.8 02/01/2023 1121   LDLCALC 53 02/01/2023 1121   LDLCALC 69 02/15/2022 0919   LDLCALC 108 (H) 10/22/2020 1026    Physical Exam:    VS:  BP 122/84   Pulse 66   Ht 5\' 10"  (1.778 m)   Wt 202 lb 9.6 oz (91.9 kg)   SpO2 94%   BMI 29.07 kg/m     Wt Readings from Last 3 Encounters:  03/31/23 202 lb 9.6 oz (91.9 kg)  03/24/23 197 lb 12.8 oz (89.7 kg)  02/28/23 206 lb 3.2 oz (93.5 kg)     GEN:  Well nourished, well developed in no acute distress HEENT: Normal NECK: No JVD; No carotid bruits CARDIAC: bradycardic, regular, no murmurs, rubs, gallops RESPIRATORY:  Clear to auscultation without rales, wheezing or rhonchi  ABDOMEN: Soft, non-tender, non-distended MUSCULOSKELETAL:  No edema; No deformity  SKIN: Warm and dry NEUROLOGIC:  Alert and oriented x 3 PSYCHIATRIC:  Normal affect   ASSESSMENT:    1. Bradycardia   2. PVC's (premature ventricular contractions)   3. Hyperlipidemia LDL goal <70      PLAN:    Sinus bradycardia: Heart rate down to 40s while awake on monitor 02/2020.  There were multiple patient triggered events, which primarily corresponded to normal heart rates but there was one patient triggered event with a heart rate 40 bpm, with reported symptoms of fluttering/heart racing.  He denies any lightheadedness, syncope, presyncope, fatigue, chest pain, or dyspnea.  As does not appear symptomatic from bradycardia, no indication for pacemaker at this time.  TTE 03/12/2020 showed no structural heart disease. -Remains asymptomatic, continue to monitor   PVCs: 1.4% of beats on monitor 01/2020.  No structural heart disease on TTE 02/2020  Hyperlipidemia: LDL 108 on 10/22/2020.  10-year ASCVD risk score 13%.  Calcium score on 07/27/2021 was 237 (59th percentile).  Did not tolerate statins due to myalgias.  Started inclisiran.  LDL 53  on 02/01/2023  Aortic dilatation: Ascending aorta measuring 40 mm on calcium score 07/27/21.  CTA chest 07/2022 showed normal caliber ascending thoracic aorta measuring up to 37 mm   RTC in 1 year   Medication Adjustments/Labs and Tests Ordered: Current medicines are reviewed at length with the patient today.  Concerns regarding medicines are outlined above.  No orders of the defined types were placed in this encounter.    No orders of the defined types were placed in this encounter.    Patient Instructions  Medication Instructions:  Your physician recommends that you continue on your current medications as directed. Please refer to the Current Medication list given to you today.  *If you need a refill on your cardiac medications before your next appointment, please call your pharmacy*  Follow-Up: At [facility], you and your health needs are our priority.  As part of our continuing mission to provide you with exceptional heart care, we have created designated Provider Care Teams.  These Care Teams include your primary Cardiologist (physician) and Advanced Practice Providers (APPs -  Physician Assistants and Nurse Practitioners) who all work together to provide you with the care you need, when you need it.  We recommend signing up for the patient portal called "MyChart".  Sign up information is provided on this After Visit Summary.  MyChart is used to connect with patients for Virtual Visits (Telemedicine).  Patients are able to view lab/test results, encounter notes, upcoming appointments, etc.  Non-urgent messages can be sent to your provider as  well.   To learn more about what you can do with MyChart, go to [email or web address].    Your next appointment:   12 month(s)  Provider:   Little Ishikawahristopher L Saxton Chain, MD          Signed, Little Ishikawahristopher L Nevyn Bossman, MD  03/31/2023 1:40 PM    [facility]

## 2023-03-31 ENCOUNTER — Encounter: Payer: Self-pay | Admitting: Cardiology

## 2023-03-31 ENCOUNTER — Ambulatory Visit: Payer: Medicare Other | Attending: Cardiology | Admitting: Cardiology

## 2023-03-31 VITALS — BP 122/84 | HR 66 | Ht 70.0 in | Wt 202.6 lb

## 2023-03-31 DIAGNOSIS — E785 Hyperlipidemia, unspecified: Secondary | ICD-10-CM

## 2023-03-31 DIAGNOSIS — I493 Ventricular premature depolarization: Secondary | ICD-10-CM

## 2023-03-31 DIAGNOSIS — R001 Bradycardia, unspecified: Secondary | ICD-10-CM | POA: Diagnosis not present

## 2023-03-31 NOTE — Patient Instructions (Signed)
Medication Instructions:  Your physician recommends that you continue on your current medications as directed. Please refer to the Current Medication list given to you today.  *If you need a refill on your cardiac medications before your next appointment, please call your pharmacy*  Follow-Up: At [facility], you and your health needs are our priority.  As part of our continuing mission to provide you with exceptional heart care, we have created designated Provider Care Teams.  These Care Teams include your primary Cardiologist (physician) and Advanced Practice Providers (APPs -  Physician Assistants and Nurse Practitioners) who all work together to provide you with the care you need, when you need it.  We recommend signing up for the patient portal called "MyChart".  Sign up information is provided on this After Visit Summary.  MyChart is used to connect with patients for Virtual Visits (Telemedicine).  Patients are able to view lab/test results, encounter notes, upcoming appointments, etc.  Non-urgent messages can be sent to your provider as well.   To learn more about what you can do with MyChart, go to [email or web address].    Your next appointment:   12 month(s)  Provider:   Christopher L Schumann, MD      

## 2023-04-12 ENCOUNTER — Encounter: Payer: Self-pay | Admitting: Family Medicine

## 2023-04-24 ENCOUNTER — Ambulatory Visit (INDEPENDENT_AMBULATORY_CARE_PROVIDER_SITE_OTHER): Payer: Medicare Other | Admitting: Pulmonary Disease

## 2023-04-24 ENCOUNTER — Encounter: Payer: Self-pay | Admitting: Pulmonary Disease

## 2023-04-24 VITALS — BP 124/64 | HR 58 | Ht 70.0 in | Wt 205.0 lb

## 2023-04-24 DIAGNOSIS — R053 Chronic cough: Secondary | ICD-10-CM | POA: Diagnosis not present

## 2023-04-24 NOTE — Patient Instructions (Addendum)
We will schedule you for a CT chest in the coming weeks to evaluate your cough.  We will schedule you for pulmonary function tests at the earliest possible date/time at [facility] or here.  Continue to monitor for post-nasal drainage or reflux that is leading to your cough symptoms.   Follow up in 2 months

## 2023-04-24 NOTE — Progress Notes (Signed)
Synopsis: Referred in May 2024 for chronci cough by Tristan Conn, MD  Subjective:   PATIENT ID: Tristan Hamilton GENDER: male DOB: Nov 02, 1951, MRN: [id number]  HPI  Chief Complaint  Patient presents with   Consult    Referred by PCP for productive cough with clear phlegm for the past 3 months. Denies any increased wheezing or SOB with cough.    Tristan Hamilton is a 72 year old male, never smoker with history of GERD, CAD and prostate cancer who is referred to pulmonary clinic for chronic cough.   He was treated for acute bronchitis 02/01/23 with doxycycline and prednisone. He was prescribe a medrol dose pack 02/15/23 due to on going cough symptoms. He was treated for pneumonia 02/23/23 with augmentin and azithromycin for RLL infiltrate.   He reports having cough since December. The cough is productive with clear phlegm. No sinus congestion or post-nasal drainage. He has occasional heart burn. No night time awakenings due to reflux. No wheezing. No dyspnea. No muscle aches or joint pains. Reclining in his chair can make the cough worse. The cough does not wake him up at night.   He was prescribed symbicort 160-4.34mcg 2 puffs twice daily and did not notice much improvement in his cough. He stopped this a week a go with no increase in cough symptoms.  He was started on inclisiran 11/29/22. Listed adverse effects of bronchitis (4.3%).  Never smoker. He had second hand smoke in childhood from his father. He is retired from IT work. No occupational dust or chemical exposures.   Past Medical History:  Diagnosis Date   Ascending aorta dilation (HCC)    last CTA 07-29-2022  3.7cm   Asymptomatic bilateral carotid artery stenosis    doppler 06-22-2022 bilateral ICA 1-39%   Bladder calculi    BPH (benign prostatic hyperplasia)    Cancer (HCC)    prostate cancer in 2012   Coronary artery calcification seen on CT scan    cardiologist--- dr c. Georgia Lopes;   coronary calcium score CT 07-27-2022 (237)    GERD (gastroesophageal reflux disease)    Heart murmur    no problems   History of DVT of lower extremity 1975   LLE  post op knee arthroscopy right side ;   treated with blood thinner,  per pt no clot before or since 1975   History of kidney stones    History of migraine    h/o of ocular migranes   History of prostate cancer    urologist--- dr winter;  per dr winter note dx 2011 w/ Gleason 6 in [place] , [place];  active survillance  (09-16-2022  per pt last bx done did not show any cancer)   Hyperlipidemia, mixed    Myalgia, multiple sites    09-16-2022 per pt chronic multiple site ,  intermittant since taking statin medication's,  takes prednisone daily   Osteoarthritis of both knees    Pre-diabetes    07/29/22 Hgb A1c 5.9   Sinus bradycardia seen on cardiac monitor 02/24/2020   lowest heart rate 35 w/ occasional PVCs / PACs   Urinary retention 10/01/2022   Patient has been daily self catherizing since 10/01/22, as of 01/02/23.   Wears dentures    upper   Wears glasses    mainly for reading     Family History  Problem Relation Age of Onset   Diverticulitis Mother    Penile cancer Father    Stroke Sister 75  covid, then massive stroke   Prostate cancer Maternal Grandfather    Colon cancer Neg Hx    Colon polyps Neg Hx    Esophageal cancer Neg Hx    Rectal cancer Neg Hx    Stomach cancer Neg Hx      Social History   Socioeconomic History   Marital status: Married    Spouse name: Not on file   Number of children: Not on file   Years of education: Not on file   Highest education level: Some college, no degree  Occupational History   Not on file  Tobacco Use   Smoking status: Never   Smokeless tobacco: Never  Vaping Use   Vaping Use: Never used  Substance and Sexual Activity   Alcohol use: Not Currently    Comment: wine very rarely   Drug use: Never   Sexual activity: Not on file    Comment: vasectomy  Other Topics Concern   Not on file  Social  History Narrative   Not on file   Social Determinants of Health   Financial Resource Strain: Low Risk  (03/23/2023)   Overall Financial Resource Strain (CARDIA)    Difficulty of Paying Living Expenses: Not hard at all  Food Insecurity: No Food Insecurity (03/23/2023)   Hunger Vital Sign    Worried About Running Out of Food in the Last Year: Never true    Ran Out of Food in the Last Year: Never true  Transportation Needs: No Transportation Needs (03/23/2023)   PRAPARE - Administrator, Civil Service (Medical): No    Lack of Transportation (Non-Medical): No  Physical Activity: Unknown (03/23/2023)   Exercise Vital Sign    Days of Exercise per Week: Patient declined    Minutes of Exercise per Session: 0 min  Stress: No Stress Concern Present (03/23/2023)   [employer] of Occupational Health - Occupational Stress Questionnaire    Feeling of Stress : Not at all  Social Connections: Socially Integrated (03/23/2023)   Social Connection and Isolation Panel [NHANES]    Frequency of Communication with Friends and Family: More than three times a week    Frequency of Social Gatherings with Friends and Family: More than three times a week    Attends Religious Services: More than 4 times per year    Active Member of [employer] or Organizations: Yes    Attends Engineer, structural: More than 4 times per year    Marital Status: Married  Catering manager Violence: Not At Risk (06/25/2021)   Humiliation, Afraid, Rape, and Kick questionnaire    Fear of Current or Ex-Partner: No    Emotionally Abused: No    Physically Abused: No    Sexually Abused: No     Allergies  Allergen Reactions   Crestor [Rosuvastatin]     myalgia   Lipitor [Atorvastatin]     myalgia   Nexletol [Bempedoic Acid]     Muscle aches     Outpatient Medications Prior to Visit  Medication Sig Dispense Refill   albuterol (VENTOLIN HFA) 108 (90 Base) MCG/ACT inhaler Inhale 2 puffs into the lungs every 6 (six) hours  as needed for wheezing or shortness of breath. 8 g 0   budesonide-formoterol (SYMBICORT) 160-4.5 MCG/ACT inhaler Inhale 2 puffs into the lungs 2 (two) times daily. 1 each 3   Cholecalciferol 125 MCG (5000 UT) capsule Take 5,000 Units by mouth daily.      cimetidine (CIMETIDINE 200) 200 MG tablet Take 1  tablet (200 mg total) by mouth 2 (two) times daily as needed. Pt isn't sure of the dosage (Patient taking differently: Take 200 mg by mouth as needed. Pt isn't sure of the dosage) 60 tablet 2   Coenzyme Q10 100 MG TABS Take 1 tablet by mouth daily. Takes 200 mg     DHEA 50 MG TABS Take 1 tablet by mouth daily.     Hyprom-Naphaz-Polysorb-Zn Sulf (CLEAR EYES COMPLETE OP) Apply to eye as needed.     inclisiran (LEQVIO) 284 MG/1.5ML SOSY injection Inject 284 mg into the skin once. Last injection 11/29/22.     vitamin B-12 (CYANOCOBALAMIN) 50 MCG tablet Take 50 mcg by mouth daily.     No facility-administered medications prior to visit.    Review of Systems  Constitutional:  Negative for chills, fever, malaise/fatigue and weight loss.  HENT:  Negative for congestion, sinus pain and sore throat.   Eyes: Negative.   Respiratory:  Positive for cough and sputum production. Negative for hemoptysis, shortness of breath and wheezing.   Cardiovascular:  Negative for chest pain, palpitations, orthopnea, claudication and leg swelling.  Gastrointestinal:  Negative for abdominal pain, heartburn, nausea and vomiting.  Genitourinary: Negative.   Musculoskeletal:  Negative for joint pain and myalgias.  Skin:  Negative for rash.  Neurological:  Negative for weakness.  Endo/Heme/Allergies: Negative.   Psychiatric/Behavioral: Negative.        Objective:   Vitals:   04/24/23 0855  BP: 124/64  Pulse: (!) 58  SpO2: 96%  Weight: 205 lb (93 kg)  Height: 5\' 10"  (1.778 m)     Physical Exam Constitutional:      General: He is not in acute distress. HENT:     Head: Normocephalic and atraumatic.  Eyes:      Extraocular Movements: Extraocular movements intact.     Conjunctiva/sclera: Conjunctivae normal.     Pupils: Pupils are equal, round, and reactive to light.  Cardiovascular:     Rate and Rhythm: Normal rate and regular rhythm.     Pulses: Normal pulses.     Heart sounds: Normal heart sounds. No murmur heard. Pulmonary:     Effort: Pulmonary effort is normal.     Breath sounds: Normal breath sounds. No wheezing, rhonchi or rales.  Musculoskeletal:     Right lower leg: No edema.     Left lower leg: No edema.  Lymphadenopathy:     Cervical: No cervical adenopathy.  Skin:    General: Skin is warm and dry.  Neurological:     General: No focal deficit present.     Mental Status: He is alert.    CBC    Component Value Date/Time   WBC 10.8 (H) 10/01/2022 1811   RBC 5.08 10/01/2022 1811   HGB 16.6 10/01/2022 1811   HCT 46.7 10/01/2022 1811   PLT 210 10/01/2022 1811   MCV 91.9 10/01/2022 1811   MCH 32.7 10/01/2022 1811   MCHC 35.5 10/01/2022 1811   RDW 12.0 10/01/2022 1811   LYMPHSABS 1.5 10/27/2021 1100   MONOABS 1.0 10/27/2021 1100   EOSABS 0.1 10/27/2021 1100   BASOSABS 0.0 10/27/2021 1100      Latest Ref Rng & Units 10/01/2022    6:11 PM 12/06/2021   10:19 AM 10/27/2021   11:00 AM  BMP  Glucose 70 - 99 mg/dL 161  096  045   BUN 8 - 23 mg/dL 13  13  16    Creatinine 0.61 - 1.24 mg/dL 4.09  8.11  1.00   BUN/Creat Ratio 10 - 24  13    Sodium 135 - 145 mmol/L 134  143  138   Potassium 3.5 - 5.1 mmol/L 3.8  4.2  4.3   Chloride 98 - 111 mmol/L 101  101  99   CO2 22 - 32 mmol/L 22  29  31    Calcium 8.9 - 10.3 mg/dL 9.4  16.1  9.7    Chest imaging: CXR 02/23/23 Cardiac silhouette is unremarkable. No pneumothorax or pleural effusion. The lungs are clear. The visualized skeletal structures are unremarkable.  PFT:     No data to display          Labs:  Path:  Echo 03/12/20: LV EF 60-65%. RV size and systolic function are normal.  Heart Catheterization:     Assessment & Plan:   Chronic cough - Plan: CT Chest Wo Contrast, Pulmonary Function Test  Discussion: Tristan Hamilton is a 72 year old male, never smoker with history of GERD, CAD and prostate cancer who is referred to pulmonary clinic for chronic cough.  Differential includes GERD vs post-nasal drainage vs allergies vs medication reaction.   He did not notice benefit from symbicort inhaler, so less concerning for reactive airways disease. He is ok to stop this medication.  He started inclisaran in December 2023 and his cough appears to have started after this medication. Listed adverse affect is bronchitis (4.3%).   We will check CT Chest scan and pulmonary function tests for further evaluation.  We will consider empiric treatment with fexofenadine, fluticasone and pepcid in the future.   Follow up in 2 months.  Melody Comas, MD [facility] Office: [phone]    Current Outpatient Medications:    albuterol (VENTOLIN HFA) 108 (90 Base) MCG/ACT inhaler, Inhale 2 puffs into the lungs every 6 (six) hours as needed for wheezing or shortness of breath., Disp: 8 g, Rfl: 0   budesonide-formoterol (SYMBICORT) 160-4.5 MCG/ACT inhaler, Inhale 2 puffs into the lungs 2 (two) times daily., Disp: 1 each, Rfl: 3   Cholecalciferol 125 MCG (5000 UT) capsule, Take 5,000 Units by mouth daily. , Disp: , Rfl:    cimetidine (CIMETIDINE 200) 200 MG tablet, Take 1 tablet (200 mg total) by mouth 2 (two) times daily as needed. Pt isn't sure of the dosage (Patient taking differently: Take 200 mg by mouth as needed. Pt isn't sure of the dosage), Disp: 60 tablet, Rfl: 2   Coenzyme Q10 100 MG TABS, Take 1 tablet by mouth daily. Takes 200 mg, Disp: , Rfl:    DHEA 50 MG TABS, Take 1 tablet by mouth daily., Disp: , Rfl:    Hyprom-Naphaz-Polysorb-Zn Sulf (CLEAR EYES COMPLETE OP), Apply to eye as needed., Disp: , Rfl:    inclisiran (LEQVIO) 284 MG/1.5ML SOSY injection, Inject 284 mg into  the skin once. Last injection 11/29/22., Disp: , Rfl:    vitamin B-12 (CYANOCOBALAMIN) 50 MCG tablet, Take 50 mcg by mouth daily., Disp: , Rfl:

## 2023-05-18 ENCOUNTER — Other Ambulatory Visit (HOSPITAL_BASED_OUTPATIENT_CLINIC_OR_DEPARTMENT_OTHER): Payer: Medicare Other

## 2023-05-23 ENCOUNTER — Ambulatory Visit (INDEPENDENT_AMBULATORY_CARE_PROVIDER_SITE_OTHER): Payer: Medicare Other | Admitting: Pulmonary Disease

## 2023-05-23 DIAGNOSIS — R053 Chronic cough: Secondary | ICD-10-CM

## 2023-05-23 LAB — PULMONARY FUNCTION TEST
DL/VA % pred: 95 %
DL/VA: 3.82 ml/min/mmHg/L
DLCO cor % pred: 102 %
DLCO cor: 26.31 ml/min/mmHg
DLCO unc % pred: 102 %
DLCO unc: 26.31 ml/min/mmHg
FEF 25-75 Post: 2.86 L/sec
FEF 25-75 Pre: 3.09 L/sec
FEF2575-%Change-Post: -7 %
FEF2575-%Pred-Post: 120 %
FEF2575-%Pred-Pre: 130 %
FEV1-%Change-Post: -4 %
FEV1-%Pred-Post: 107 %
FEV1-%Pred-Pre: 111 %
FEV1-Post: 3.41 L
FEV1-Pre: 3.55 L
FEV1FVC-%Change-Post: -9 %
FEV1FVC-%Pred-Pre: 105 %
FEV6-%Change-Post: 8 %
FEV6-%Pred-Post: 117 %
FEV6-%Pred-Pre: 108 %
FEV6-Post: 4.82 L
FEV6-Pre: 4.46 L
FEV6FVC-%Change-Post: 0 %
FEV6FVC-%Pred-Post: 105 %
FEV6FVC-%Pred-Pre: 105 %
FVC-%Change-Post: 5 %
FVC-%Pred-Post: 111 %
FVC-%Pred-Pre: 105 %
FVC-Post: 4.87 L
FVC-Pre: 4.61 L
Post FEV1/FVC ratio: 70 %
Post FEV6/FVC ratio: 99 %
Pre FEV1/FVC ratio: 77 %
Pre FEV6/FVC Ratio: 99 %
RV % pred: 73 %
RV: 1.83 L
TLC % pred: 95 %
TLC: 6.72 L

## 2023-05-23 NOTE — Progress Notes (Signed)
Full PFT completed today ? ?

## 2023-05-29 ENCOUNTER — Ambulatory Visit: Payer: Medicare Other | Admitting: Pulmonary Disease

## 2023-05-29 ENCOUNTER — Ambulatory Visit (HOSPITAL_BASED_OUTPATIENT_CLINIC_OR_DEPARTMENT_OTHER)
Admission: RE | Admit: 2023-05-29 | Discharge: 2023-05-29 | Disposition: A | Discharge status: Home / Self Care | Payer: Medicare Other | Source: Ambulatory Visit | Attending: Pulmonary Disease | Admitting: Pulmonary Disease

## 2023-05-29 ENCOUNTER — Encounter (HOSPITAL_BASED_OUTPATIENT_CLINIC_OR_DEPARTMENT_OTHER): Payer: Medicare Other

## 2023-05-29 DIAGNOSIS — R053 Chronic cough: Secondary | ICD-10-CM | POA: Insufficient documentation

## 2023-05-31 ENCOUNTER — Ambulatory Visit: Payer: Medicare Other

## 2023-06-14 IMAGING — DX DG CERVICAL SPINE 2 OR 3 VIEWS
3 series · 3 of 3 positions shown · non-contrast
Comparison: None Available.

CLINICAL DATA: Pain

EXAM:
CERVICAL SPINE - 2-3 VIEW

[c-spine lat]
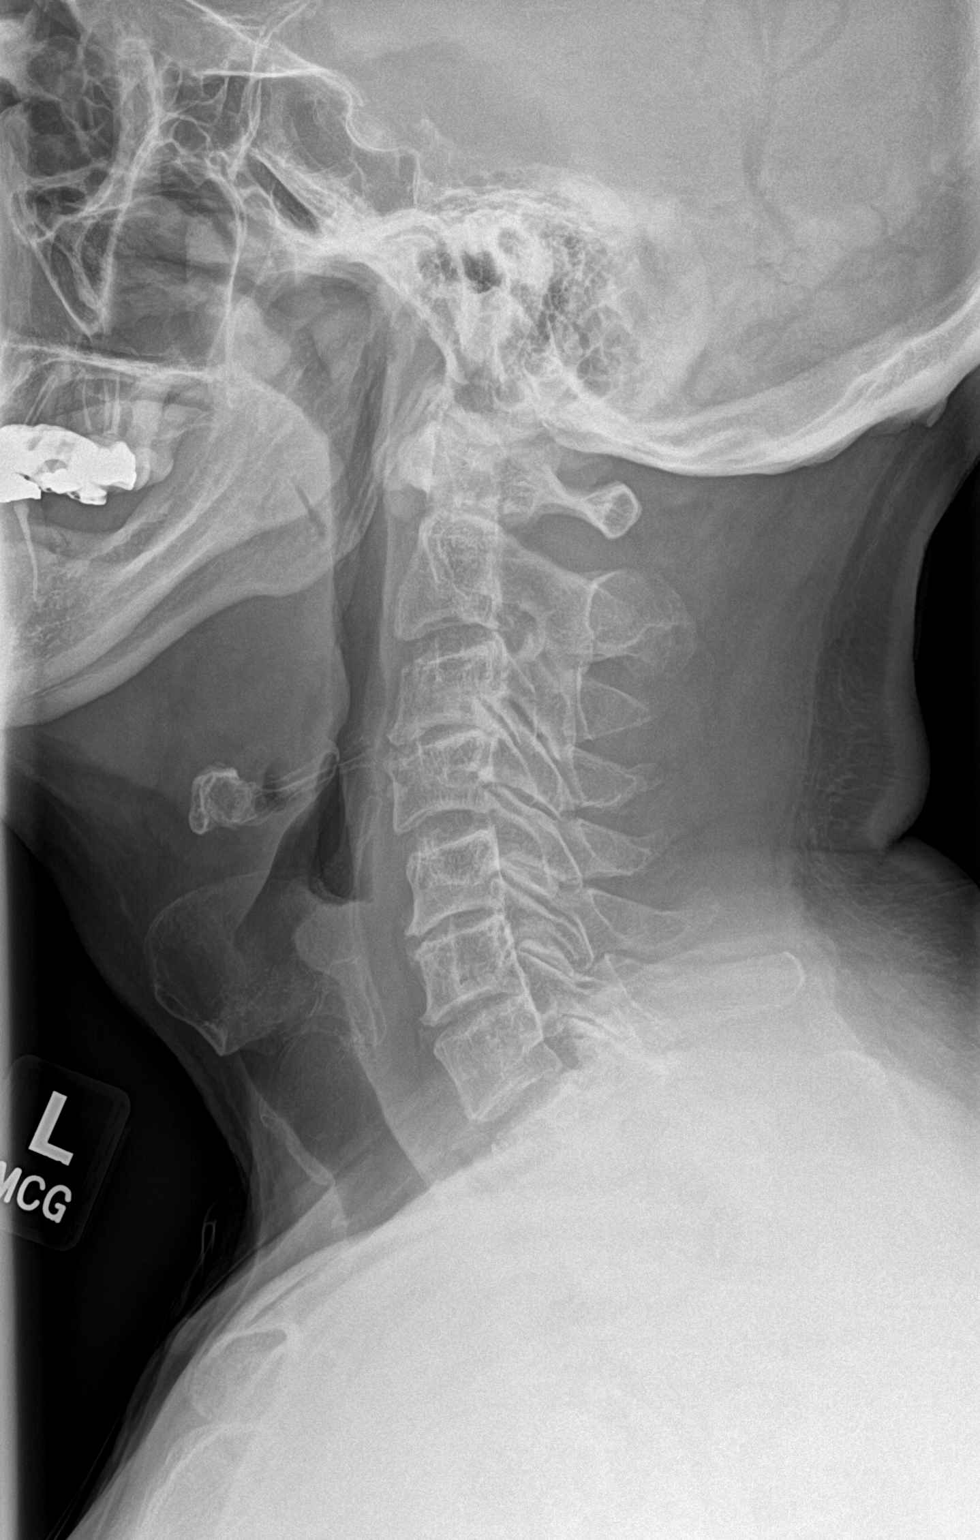

[c-spine ap]
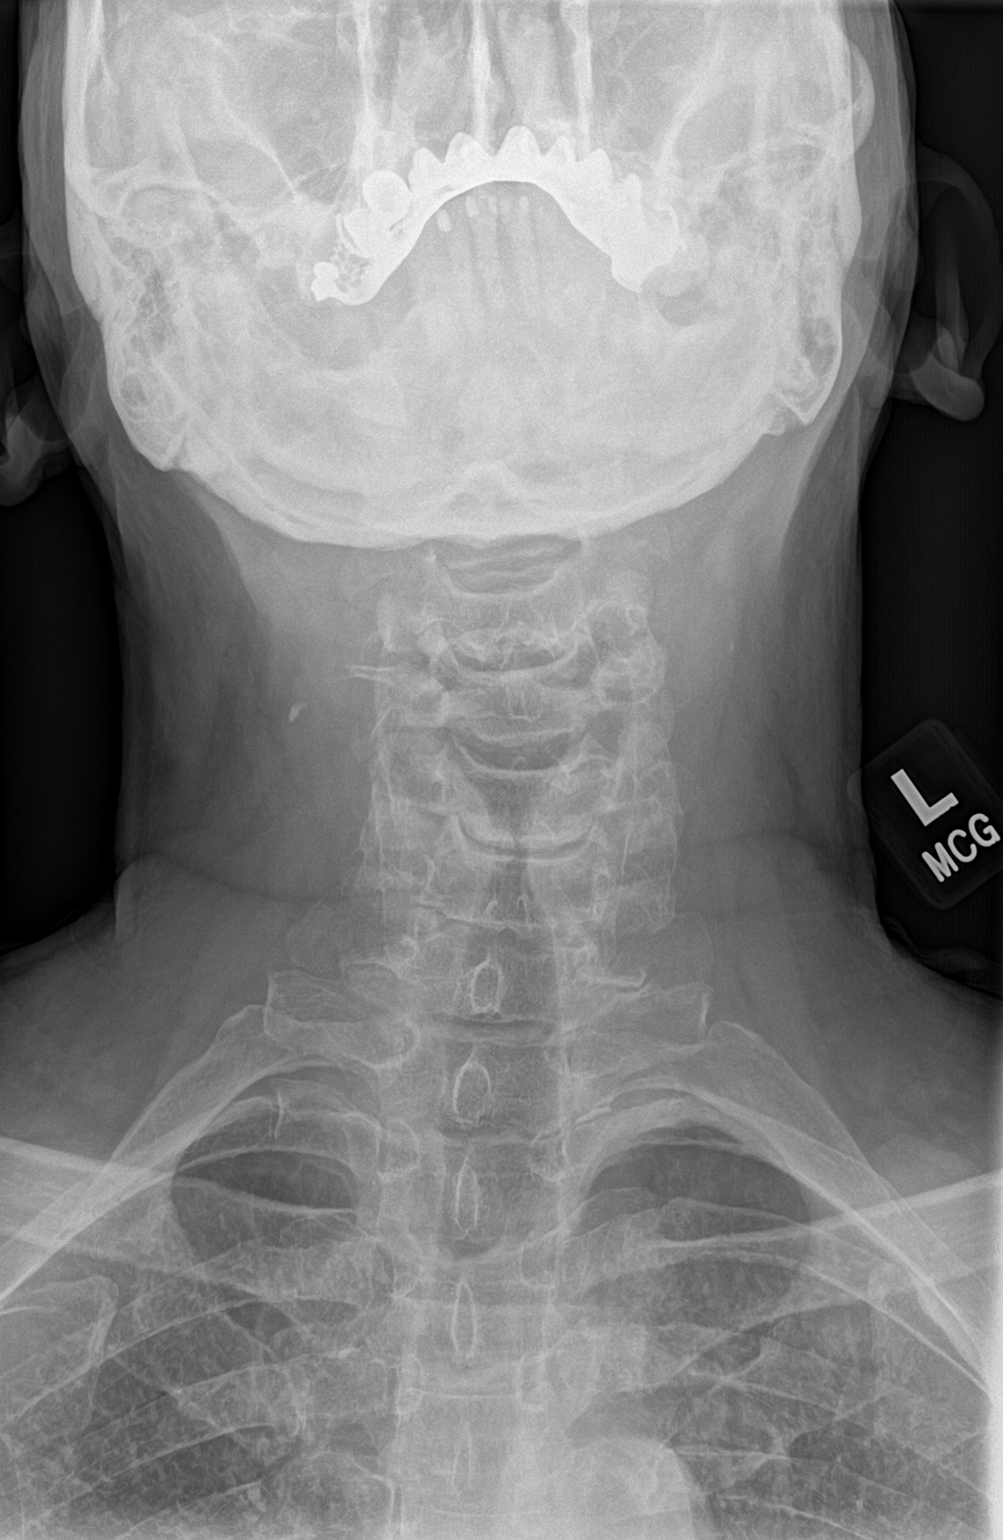

[c-spine open mouth]
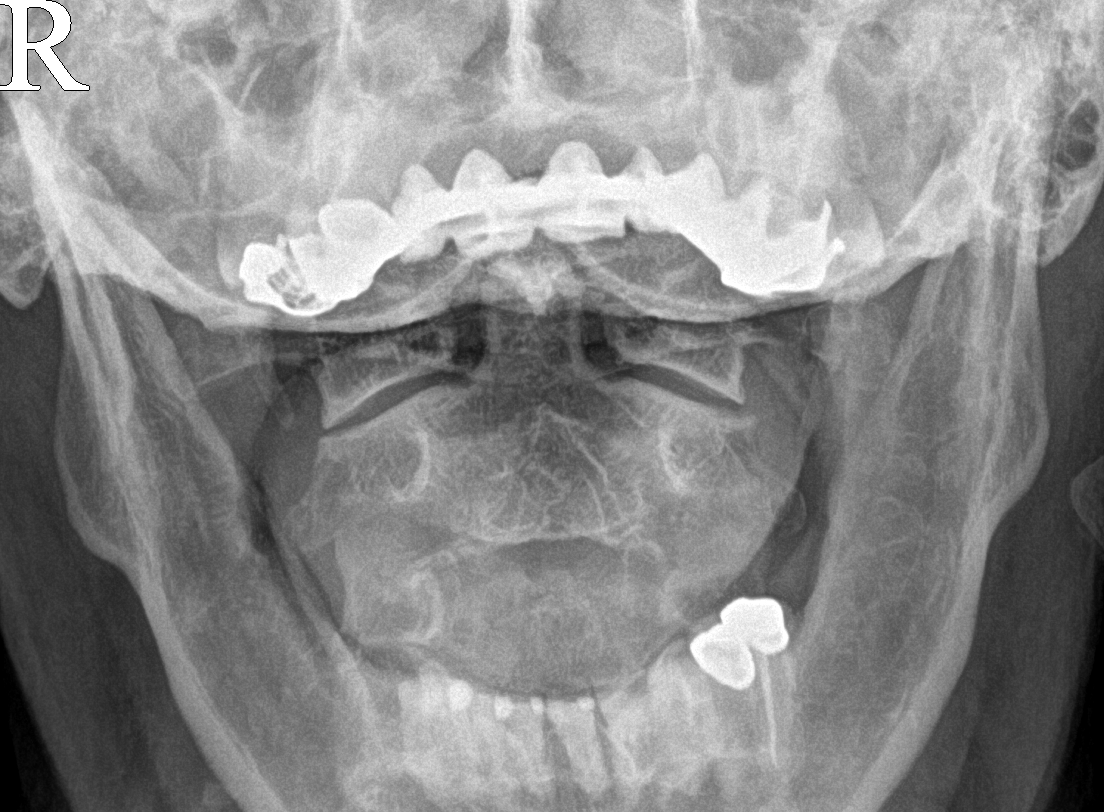

[3 of 3 positions shown; findings below may reference images not displayed]

FINDINGS: Uncovertebral degenerative changes and mild multilevel degenerative
disc disease. No fracture or traumatic malalignment. Soft tissue
calcifications in both sides of the neck, right greater than left.
Lung apices are normal.
IMPRESSION: 1. Degenerative changes in the cervical spine as above.
2. Soft tissue calcifications in both sides of the neck are likely
vascular, probably associated with the carotids.
3. Lung apices are normal.

## 2023-06-20 ENCOUNTER — Encounter: Payer: Self-pay | Admitting: Cardiology

## 2023-06-20 DIAGNOSIS — I2510 Atherosclerotic heart disease of native coronary artery without angina pectoris: Secondary | ICD-10-CM

## 2023-06-20 DIAGNOSIS — E785 Hyperlipidemia, unspecified: Secondary | ICD-10-CM

## 2023-07-11 ENCOUNTER — Encounter: Payer: Self-pay | Admitting: Pulmonary Disease

## 2023-07-11 ENCOUNTER — Ambulatory Visit: Payer: Medicare Other | Admitting: Pulmonary Disease

## 2023-07-11 VITALS — BP 118/64 | HR 52 | Ht 70.0 in | Wt 205.4 lb

## 2023-07-11 DIAGNOSIS — R053 Chronic cough: Secondary | ICD-10-CM | POA: Diagnosis not present

## 2023-07-11 NOTE — Patient Instructions (Addendum)
Your CT Chest scan shows no issues with your lung tissue or airways  Your breathing tests are normal  Recommend using cimetidine as needed for reflux/heartburn  Recommend using fexofenedine 180mg  daily for allergies that lead to sinus congestion along with flonase 1 spray per nostril daily for sinus congestion/drainage  Follow up as needed

## 2023-07-11 NOTE — Progress Notes (Signed)
Synopsis: Referred in May 2024 for chronci cough by Nira Conn, MD  Subjective:   PATIENT ID: Tristan Hamilton GENDER: male DOB: 1951-07-17, MRN: [id number]  HPI  Chief Complaint  Patient presents with   Follow-up    F/U after PFT. States his breathing and coughing spell have improved since last visit.    Tristan Hamilton is a 72 year old male, never smoker with history of GERD, CAD and prostate cancer who returns  to pulmonary clinic for chronic cough.   PFTs 05/23/23 are within normal limits. CT Chest 05/29/23 does not show findings of ILD. There is elevated elevated right hemidiaphragm.   He reports the cough has improved since last visit. He has not started any new medications or treatments. He wonders if the cough was mainly from sinus congestion/drainage that is now better.    Initial OV 04/24/23 He was treated for acute bronchitis 02/01/23 with doxycycline and prednisone. He was prescribe a medrol dose pack 02/15/23 due to on going cough symptoms. He was treated for pneumonia 02/23/23 with augmentin and azithromycin for RLL infiltrate.   He reports having cough since December. The cough is productive with clear phlegm. No sinus congestion or post-nasal drainage. He has occasional heart burn. No night time awakenings due to reflux. No wheezing. No dyspnea. No muscle aches or joint pains. Reclining in his chair can make the cough worse. The cough does not wake him up at night.   He was prescribed symbicort 160-4.33mcg 2 puffs twice daily and did not notice much improvement in his cough. He stopped this a week a go with no increase in cough symptoms.  He was started on inclisiran 11/29/22. Listed adverse effects of bronchitis (4.3%).  Never smoker. He had second hand smoke in childhood from his father. He is retired from IT work. No occupational dust or chemical exposures.   Past Medical History:  Diagnosis Date   Ascending aorta dilation (HCC)    last CTA 07-29-2022  3.7cm    Asymptomatic bilateral carotid artery stenosis    doppler 06-22-2022 bilateral ICA 1-39%   Bladder calculi    BPH (benign prostatic hyperplasia)    Cancer (HCC)    prostate cancer in 2012   Coronary artery calcification seen on CT scan    cardiologist--- dr c. Georgia Lopes;   coronary calcium score CT 07-27-2022 (237)   GERD (gastroesophageal reflux disease)    Heart murmur    no problems   History of DVT of lower extremity 1975   LLE  post op knee arthroscopy right side ;   treated with blood thinner,  per pt no clot before or since 1975   History of kidney stones    History of migraine    h/o of ocular migranes   History of prostate cancer    urologist--- dr winter;  per dr winter note dx 2011 w/ Gleason 6 in [place] , [place];  active survillance  (09-16-2022  per pt last bx done did not show any cancer)   Hyperlipidemia, mixed    Myalgia, multiple sites    09-16-2022 per pt chronic multiple site ,  intermittant since taking statin medication's,  takes prednisone daily   Osteoarthritis of both knees    Pre-diabetes    07/29/22 Hgb A1c 5.9   Sinus bradycardia seen on cardiac monitor 02/24/2020   lowest heart rate 35 w/ occasional PVCs / PACs   Urinary retention 10/01/2022   Patient has been daily self catherizing since 10/01/22, as  of 01/02/23.   Wears dentures    upper   Wears glasses    mainly for reading     Family History  Problem Relation Age of Onset   Diverticulitis Mother    Penile cancer Father    Stroke Sister 5       covid, then massive stroke   Prostate cancer Maternal Grandfather    Colon cancer Neg Hx    Colon polyps Neg Hx    Esophageal cancer Neg Hx    Rectal cancer Neg Hx    Stomach cancer Neg Hx      Social History   Socioeconomic History   Marital status: Married    Spouse name: Not on file   Number of children: Not on file   Years of education: Not on file   Highest education level: Some college, no degree  Occupational History   Not on file   Tobacco Use   Smoking status: Never   Smokeless tobacco: Never  Vaping Use   Vaping status: Never Used  Substance and Sexual Activity   Alcohol use: Not Currently    Comment: wine very rarely   Drug use: Never   Sexual activity: Not on file    Comment: vasectomy  Other Topics Concern   Not on file  Social History Narrative   Not on file   Social Determinants of Health   Financial Resource Strain: Low Risk  (03/23/2023)   Overall Financial Resource Strain (CARDIA)    Difficulty of Paying Living Expenses: Not hard at all  Food Insecurity: No Food Insecurity (03/23/2023)   Hunger Vital Sign    Worried About Running Out of Food in the Last Year: Never true    Ran Out of Food in the Last Year: Never true  Transportation Needs: No Transportation Needs (03/23/2023)   PRAPARE - Administrator, Civil Service (Medical): No    Lack of Transportation (Non-Medical): No  Physical Activity: Unknown (03/23/2023)   Exercise Vital Sign    Days of Exercise per Week: Patient declined    Minutes of Exercise per Session: 0 min  Stress: No Stress Concern Present (03/23/2023)   [employer] of Occupational Health - Occupational Stress Questionnaire    Feeling of Stress : Not at all  Social Connections: Socially Integrated (03/23/2023)   Social Connection and Isolation Panel [NHANES]    Frequency of Communication with Friends and Family: More than three times a week    Frequency of Social Gatherings with Friends and Family: More than three times a week    Attends Religious Services: More than 4 times per year    Active Member of [employer] or Organizations: Yes    Attends Engineer, structural: More than 4 times per year    Marital Status: Married  Catering manager Violence: Not At Risk (06/25/2021)   Humiliation, Afraid, Rape, and Kick questionnaire    Fear of Current or Ex-Partner: No    Emotionally Abused: No    Physically Abused: No    Sexually Abused: No     Allergies   Allergen Reactions   Crestor [Rosuvastatin]     myalgia   Lipitor [Atorvastatin]     myalgia   Nexletol [Bempedoic Acid]     Muscle aches     Outpatient Medications Prior to Visit  Medication Sig Dispense Refill   albuterol (VENTOLIN HFA) 108 (90 Base) MCG/ACT inhaler Inhale 2 puffs into the lungs every 6 (six) hours as needed for wheezing  or shortness of breath. 8 g 0   budesonide-formoterol (SYMBICORT) 160-4.5 MCG/ACT inhaler Inhale 2 puffs into the lungs 2 (two) times daily. 1 each 3   Cholecalciferol 125 MCG (5000 UT) capsule Take 5,000 Units by mouth daily.      cimetidine (CIMETIDINE 200) 200 MG tablet Take 1 tablet (200 mg total) by mouth 2 (two) times daily as needed. Pt isn't sure of the dosage (Patient taking differently: Take 200 mg by mouth as needed. Pt isn't sure of the dosage) 60 tablet 2   Coenzyme Q10 100 MG TABS Take 1 tablet by mouth daily. Takes 200 mg     DHEA 50 MG TABS Take 1 tablet by mouth daily.     Hyprom-Naphaz-Polysorb-Zn Sulf (CLEAR EYES COMPLETE OP) Apply to eye as needed.     inclisiran (LEQVIO) 284 MG/1.5ML SOSY injection Inject 284 mg into the skin once. Last injection 11/29/22.     thiamine (VITAMIN B-1) 100 MG tablet Take 100 mg by mouth daily.     vitamin B-12 (CYANOCOBALAMIN) 50 MCG tablet Take 50 mcg by mouth daily.     No facility-administered medications prior to visit.    Review of Systems  Constitutional:  Negative for chills, fever, malaise/fatigue and weight loss.  HENT:  Negative for congestion, sinus pain and sore throat.   Eyes: Negative.   Respiratory:  Negative for cough, hemoptysis, sputum production, shortness of breath and wheezing.   Cardiovascular:  Negative for chest pain, palpitations, orthopnea, claudication and leg swelling.  Gastrointestinal:  Negative for abdominal pain, heartburn, nausea and vomiting.  Genitourinary: Negative.   Musculoskeletal:  Negative for joint pain and myalgias.  Skin:  Negative for rash.   Neurological:  Negative for weakness.  Endo/Heme/Allergies: Negative.   Psychiatric/Behavioral: Negative.        Objective:   Vitals:   07/11/23 0844  BP: 118/64  Pulse: (!) 52  SpO2: 97%  Weight: 205 lb 6.4 oz (93.2 kg)  Height: 5\' 10"  (1.778 m)      Physical Exam Constitutional:      General: He is not in acute distress. HENT:     Head: Normocephalic and atraumatic.  Eyes:     Conjunctiva/sclera: Conjunctivae normal.  Cardiovascular:     Rate and Rhythm: Normal rate and regular rhythm.     Pulses: Normal pulses.     Heart sounds: Normal heart sounds. No murmur heard. Pulmonary:     Effort: Pulmonary effort is normal.     Breath sounds: Normal breath sounds. No wheezing, rhonchi or rales.  Musculoskeletal:     Right lower leg: No edema.     Left lower leg: No edema.  Neurological:     Mental Status: He is alert.    CBC    Component Value Date/Time   WBC 10.8 (H) 10/01/2022 1811   RBC 5.08 10/01/2022 1811   HGB 16.6 10/01/2022 1811   HCT 46.7 10/01/2022 1811   PLT 210 10/01/2022 1811   MCV 91.9 10/01/2022 1811   MCH 32.7 10/01/2022 1811   MCHC 35.5 10/01/2022 1811   RDW 12.0 10/01/2022 1811   LYMPHSABS 1.5 10/27/2021 1100   MONOABS 1.0 10/27/2021 1100   EOSABS 0.1 10/27/2021 1100   BASOSABS 0.0 10/27/2021 1100      Latest Ref Rng & Units 10/01/2022    6:11 PM 12/06/2021   10:19 AM 10/27/2021   11:00 AM  BMP  Glucose 70 - 99 mg/dL 161  096  045   BUN 8 -  23 mg/dL 13  13  16    Creatinine 0.61 - 1.24 mg/dL 9.62  9.52  8.41   BUN/Creat Ratio 10 - 24  13    Sodium 135 - 145 mmol/L 134  143  138   Potassium 3.5 - 5.1 mmol/L 3.8  4.2  4.3   Chloride 98 - 111 mmol/L 101  101  99   CO2 22 - 32 mmol/L 22  29  31    Calcium 8.9 - 10.3 mg/dL 9.4  32.4  9.7    Chest imaging: CT Chest 05/29/23 1. Tiny 0.2 cm peripheral superior segment left lower lobe solid pulmonary nodule, new. Per Fleischner Society Guidelines, no routine follow-up imaging is  recommended. These guidelines do not apply to immunocompromised patients and patients with cancer. Follow up in patients with significant comorbidities as clinically warranted. For lung cancer screening, adhere to Lung-RADS guidelines. Reference: Radiology. 2017; 284(1):228-43. 2. Otherwise no active pulmonary disease. 3. 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 4. Two-vessel coronary atherosclerosis. 5. Mild diffuse hepatic steatosis. 6. Cholelithiasis. 7.  Aortic Atherosclerosis  CXR 02/23/23 Cardiac silhouette is unremarkable. No pneumothorax or pleural effusion. The lungs are clear. The visualized skeletal structures are unremarkable.  PFT:    Latest Ref Rng & Units 05/23/2023   12:46 PM  PFT Results  FVC-Pre L 4.61   FVC-Predicted Pre % 105   FVC-Post L 4.87   FVC-Predicted Post % 111   Pre FEV1/FVC % % 77   Post FEV1/FCV % % 70   FEV1-Pre L 3.55   FEV1-Predicted Pre % 111   FEV1-Post L 3.41   DLCO uncorrected ml/min/mmHg 26.31   DLCO UNC% % 102   DLCO corrected ml/min/mmHg 26.31   DLCO COR %Predicted % 102   DLVA Predicted % 95   TLC L 6.72   TLC % Predicted % 95   RV % Predicted % 73   PFT 2024: within normal limits  Labs:  Path:  Echo 03/12/20: LV EF 60-65%. RV size and systolic function are normal.  Heart Catheterization:    Assessment & Plan:   Chronic cough  Discussion: Tristan Hamilton is a 72 year old male, never smoker with history of GERD, CAD and prostate cancer who returns to pulmonary clinic for chronic cough.  Differential includes GERD vs post-nasal drainage vs allergies.  His cough has improved on its own and he has decreased sinus congestion/drainage.  His PFTs and CT chest scan do not indicate underlying ILD or abnormalities to his lungs that would lead to cough.   He is to use fexofenadine 180mg  daily and flonase 1 spray per nostril daily for sinus congestion/drainage. He can continue cimetidine as needed for reflux.    Follow up as needed.  Melody Comas, MD [facility] Office: [phone]    Current Outpatient Medications:    albuterol (VENTOLIN HFA) 108 (90 Base) MCG/ACT inhaler, Inhale 2 puffs into the lungs every 6 (six) hours as needed for wheezing or shortness of breath., Disp: 8 g, Rfl: 0   budesonide-formoterol (SYMBICORT) 160-4.5 MCG/ACT inhaler, Inhale 2 puffs into the lungs 2 (two) times daily., Disp: 1 each, Rfl: 3   Cholecalciferol 125 MCG (5000 UT) capsule, Take 5,000 Units by mouth daily. , Disp: , Rfl:    cimetidine (CIMETIDINE 200) 200 MG tablet, Take 1 tablet (200 mg total) by mouth 2 (two) times daily as needed. Pt isn't sure of the dosage (Patient taking differently: Take 200 mg by mouth as needed.  Pt isn't sure of the dosage), Disp: 60 tablet, Rfl: 2   Coenzyme Q10 100 MG TABS, Take 1 tablet by mouth daily. Takes 200 mg, Disp: , Rfl:    DHEA 50 MG TABS, Take 1 tablet by mouth daily., Disp: , Rfl:    Hyprom-Naphaz-Polysorb-Zn Sulf (CLEAR EYES COMPLETE OP), Apply to eye as needed., Disp: , Rfl:    inclisiran (LEQVIO) 284 MG/1.5ML SOSY injection, Inject 284 mg into the skin once. Last injection 11/29/22., Disp: , Rfl:    thiamine (VITAMIN B-1) 100 MG tablet, Take 100 mg by mouth daily., Disp: , Rfl:    vitamin B-12 (CYANOCOBALAMIN) 50 MCG tablet, Take 50 mcg by mouth daily., Disp: , Rfl:

## 2023-07-19 ENCOUNTER — Encounter (INDEPENDENT_AMBULATORY_CARE_PROVIDER_SITE_OTHER): Payer: Self-pay

## 2023-07-20 ENCOUNTER — Telehealth: Payer: Medicare Other | Admitting: Family Medicine

## 2023-07-24 ENCOUNTER — Ambulatory Visit (INDEPENDENT_AMBULATORY_CARE_PROVIDER_SITE_OTHER): Payer: Medicare Other

## 2023-07-24 VITALS — Ht 70.0 in | Wt 205.0 lb

## 2023-07-24 DIAGNOSIS — Z0000 Encounter for general adult medical examination without abnormal findings: Secondary | ICD-10-CM | POA: Diagnosis not present

## 2023-07-24 DIAGNOSIS — Z23 Encounter for immunization: Secondary | ICD-10-CM | POA: Diagnosis not present

## 2023-07-24 NOTE — Patient Instructions (Addendum)
Tristan Hamilton , Thank you for taking time to come for your Medicare Wellness Visit. I appreciate your ongoing commitment to your health goals. Please review the following plan we discussed and let me know if I can assist you in the future.   Referrals/Orders/Follow-Ups/Clinician Recommendations:   This is a list of the screening recommended for you and due dates:  Health Maintenance  Topic Date Due   Zoster (Shingles) Vaccine (1 of 2) 02/16/1970   COVID-19 Vaccine (3 - Pfizer risk series) 10/09/2020   Pneumonia Vaccine (2 of 2 - PCV) 02/04/2021   Flu Shot  07/20/2023   Medicare Annual Wellness Visit  07/23/2024   Colon Cancer Screening  08/12/2031   DTaP/Tdap/Td vaccine (2 - Td or Tdap) 01/24/2032   Hepatitis C Screening  Completed   HPV Vaccine  Aged Out    Advanced directives: (In Chart) A copy of your advanced directives are scanned into your chart should your provider ever need it.  Next Medicare Annual Wellness Visit scheduled for next year: Yes  Preventive Care 62 Years and Older, Male  Preventive care refers to lifestyle choices and visits with your health care provider that can promote health and wellness. What does preventive care include? A yearly physical exam. This is also called an annual well check. Dental exams once or twice a year. Routine eye exams. Ask your health care provider how often you should have your eyes checked. Personal lifestyle choices, including: Daily care of your teeth and gums. Regular physical activity. Eating a healthy diet. Avoiding tobacco and drug use. Limiting alcohol use. Practicing safe sex. Taking low doses of aspirin every day. Taking vitamin and mineral supplements as recommended by your health care provider. What happens during an annual well check? The services and screenings done by your health care provider during your annual well check will depend on your age, overall health, lifestyle risk factors, and family history of  disease. Counseling  Your health care provider may ask you questions about your: Alcohol use. Tobacco use. Drug use. Emotional well-being. Home and relationship well-being. Sexual activity. Eating habits. History of falls. Memory and ability to understand (cognition). Work and work Astronomer. Screening  You may have the following tests or measurements: Height, weight, and BMI. Blood pressure. Lipid and cholesterol levels. These may be checked every 5 years, or more frequently if you are over 28 years old. Skin check. Lung cancer screening. You may have this screening every year starting at age 87 if you have a 30-pack-year history of smoking and currently smoke or have quit within the past 15 years. Fecal occult blood test (FOBT) of the stool. You may have this test every year starting at age 7. Flexible sigmoidoscopy or colonoscopy. You may have a sigmoidoscopy every 5 years or a colonoscopy every 10 years starting at age 4. Prostate cancer screening. Recommendations will vary depending on your family history and other risks. Hepatitis C blood test. Hepatitis B blood test. Sexually transmitted disease (STD) testing. Diabetes screening. This is done by checking your blood sugar (glucose) after you have not eaten for a while (fasting). You may have this done every 1-3 years. Abdominal aortic aneurysm (AAA) screening. You may need this if you are a current or former smoker. Osteoporosis. You may be screened starting at age 68 if you are at high risk. Talk with your health care provider about your test results, treatment options, and if necessary, the need for more tests. Vaccines  Your health care provider may  recommend certain vaccines, such as: Influenza vaccine. This is recommended every year. Tetanus, diphtheria, and acellular pertussis (Tdap, Td) vaccine. You may need a Td booster every 10 years. Zoster vaccine. You may need this after age 67. Pneumococcal 13-valent  conjugate (PCV13) vaccine. One dose is recommended after age 24. Pneumococcal polysaccharide (PPSV23) vaccine. One dose is recommended after age 26. Talk to your health care provider about which screenings and vaccines you need and how often you need them. This information is not intended to replace advice given to you by your health care provider. Make sure you discuss any questions you have with your health care provider. Document Released: 01/01/2016 Document Revised: 08/24/2016 Document Reviewed: 10/06/2015 Elsevier Interactive Patient Education  2017 [employer].  Fall Prevention in the Home Falls can cause injuries. They can happen to people of all ages. There are many things you can do to make your home safe and to help prevent falls. What can I do on the outside of my home? Regularly fix the edges of walkways and driveways and fix any cracks. Remove anything that might make you trip as you walk through a door, such as a raised step or threshold. Trim any bushes or trees on the path to your home. Use bright outdoor lighting. Clear any walking paths of anything that might make someone trip, such as rocks or tools. Regularly check to see if handrails are loose or broken. Make sure that both sides of any steps have handrails. Any raised decks and porches should have guardrails on the edges. Have any leaves, snow, or ice cleared regularly. Use sand or salt on walking paths during winter. Clean up any spills in your garage right away. This includes oil or grease spills. What can I do in the bathroom? Use night lights. Install grab bars by the toilet and in the tub and shower. Do not use towel bars as grab bars. Use non-skid mats or decals in the tub or shower. If you need to sit down in the shower, use a plastic, non-slip stool. Keep the floor dry. Clean up any water that spills on the floor as soon as it happens. Remove soap buildup in the tub or shower regularly. Attach bath mats  securely with double-sided non-slip rug tape. Do not have throw rugs and other things on the floor that can make you trip. What can I do in the bedroom? Use night lights. Make sure that you have a light by your bed that is easy to reach. Do not use any sheets or blankets that are too big for your bed. They should not hang down onto the floor. Have a firm chair that has side arms. You can use this for support while you get dressed. Do not have throw rugs and other things on the floor that can make you trip. What can I do in the kitchen? Clean up any spills right away. Avoid walking on wet floors. Keep items that you use a lot in easy-to-reach places. If you need to reach something above you, use a strong step stool that has a grab bar. Keep electrical cords out of the way. Do not use floor polish or wax that makes floors slippery. If you must use wax, use non-skid floor wax. Do not have throw rugs and other things on the floor that can make you trip. What can I do with my stairs? Do not leave any items on the stairs. Make sure that there are handrails on both sides of  the stairs and use them. Fix handrails that are broken or loose. Make sure that handrails are as long as the stairways. Check any carpeting to make sure that it is firmly attached to the stairs. Fix any carpet that is loose or worn. Avoid having throw rugs at the top or bottom of the stairs. If you do have throw rugs, attach them to the floor with carpet tape. Make sure that you have a light switch at the top of the stairs and the bottom of the stairs. If you do not have them, ask someone to add them for you. What else can I do to help prevent falls? Wear shoes that: Do not have high heels. Have rubber bottoms. Are comfortable and fit you well. Are closed at the toe. Do not wear sandals. If you use a stepladder: Make sure that it is fully opened. Do not climb a closed stepladder. Make sure that both sides of the stepladder  are locked into place. Ask someone to hold it for you, if possible. Clearly mark and make sure that you can see: Any grab bars or handrails. First and last steps. Where the edge of each step is. Use tools that help you move around (mobility aids) if they are needed. These include: Canes. Walkers. Scooters. Crutches. Turn on the lights when you go into a dark area. Replace any light bulbs as soon as they burn out. Set up your furniture so you have a clear path. Avoid moving your furniture around. If any of your floors are uneven, fix them. If there are any pets around you, be aware of where they are. Review your medicines with your doctor. Some medicines can make you feel dizzy. This can increase your chance of falling. Ask your doctor what other things that you can do to help prevent falls. This information is not intended to replace advice given to you by your health care provider. Make sure you discuss any questions you have with your health care provider. Document Released: 10/01/2009 Document Revised: 05/12/2016 Document Reviewed: 01/09/2015 Elsevier Interactive Patient Education  2017 [employer].

## 2023-07-24 NOTE — Progress Notes (Signed)
Subjective:   Tristan Hamilton is a 72 y.o. male who presents for Medicare Annual/Subsequent preventive examination.  Visit Complete: Virtual  I connected with  Tristan Hamilton on 07/24/23 by a audio enabled telemedicine application and verified that I am speaking with the correct person using two identifiers.  Patient Location: Home  Provider Location: Home Office  I discussed the limitations of evaluation and management by telemedicine. The patient expressed understanding and agreed to proceed.  Patient Medicare AWV questionnaire was completed by the patient on 07/23/23; I have confirmed that all information answered by patient is correct and no changes since this date.  Review of Systems    Vital Signs: Unable to obtain new vitals due to this being a telehealth visit.  Cardiac Risk Factors include: advanced age (>83men, >37 women);male gender     Objective:    Today's Vitals   07/24/23 1336  Weight: 205 lb (93 kg)  Height: 5\' 10"  (1.778 m)   Body mass index is 29.41 kg/m.     07/24/2023    1:45 PM 01/04/2023    8:32 AM 10/01/2022    5:52 PM 09/28/2022    7:51 AM 07/18/2022    4:22 PM 06/25/2021    9:05 AM 03/25/2020    8:19 AM  Advanced Directives  Does Patient Have a Medical Advance Directive? Yes Yes Yes Yes Yes Yes Yes  Type of Estate agent of [place];Living will Healthcare Power of [place];Living will Healthcare Power of [place];Living will Healthcare Power of [place];Living will Healthcare Power of [place];Living will Healthcare Power of [place];Living will Healthcare Power of [place];Living will  Does patient want to make changes to medical advance directive? No - Patient declined No - Patient declined No - Patient declined    No - Patient declined  Copy of Healthcare Power of Attorney in Chart? Yes - validated most recent copy scanned in chart (See row information)  No - copy requested, Physician notified No - copy requested Yes - validated most  recent copy scanned in chart (See row information) No - copy requested No - copy requested    Current Medications (verified) Outpatient Encounter Medications as of 07/24/2023  Medication Sig   albuterol (VENTOLIN HFA) 108 (90 Base) MCG/ACT inhaler Inhale 2 puffs into the lungs every 6 (six) hours as needed for wheezing or shortness of breath.   budesonide-formoterol (SYMBICORT) 160-4.5 MCG/ACT inhaler Inhale 2 puffs into the lungs 2 (two) times daily.   Cholecalciferol 125 MCG (5000 UT) capsule Take 5,000 Units by mouth daily.    cimetidine (CIMETIDINE 200) 200 MG tablet Take 1 tablet (200 mg total) by mouth 2 (two) times daily as needed. Pt isn't sure of the dosage (Patient taking differently: Take 200 mg by mouth as needed. Pt isn't sure of the dosage)   Coenzyme Q10 100 MG TABS Take 1 tablet by mouth daily. Takes 200 mg   DHEA 50 MG TABS Take 1 tablet by mouth daily.   Hyprom-Naphaz-Polysorb-Zn Sulf (CLEAR EYES COMPLETE OP) Apply to eye as needed.   inclisiran (LEQVIO) 284 MG/1.5ML SOSY injection Inject 284 mg into the skin once. Last injection 11/29/22.   thiamine (VITAMIN B-1) 100 MG tablet Take 100 mg by mouth daily.   vitamin B-12 (CYANOCOBALAMIN) 50 MCG tablet Take 50 mcg by mouth daily.   No facility-administered encounter medications on file as of 07/24/2023.    Allergies (verified) Crestor [rosuvastatin], Lipitor [atorvastatin], and Nexletol [bempedoic acid]   History: Past Medical History:  Diagnosis Date  Ascending aorta dilation (HCC)    last CTA 07-29-2022  3.7cm   Asymptomatic bilateral carotid artery stenosis    doppler 06-22-2022 bilateral ICA 1-39%   Bladder calculi    BPH (benign prostatic hyperplasia)    Cancer (HCC)    prostate cancer in 2012   Coronary artery calcification seen on CT scan    cardiologist--- dr c. Tristan Hamilton;   coronary calcium score CT 07-27-2022 (237)   GERD (gastroesophageal reflux disease)    Heart murmur    no problems   History of DVT of  lower extremity 1975   LLE  post op knee arthroscopy right side ;   treated with blood thinner,  per pt no clot before or since 1975   History of kidney stones    History of migraine    h/o of ocular migranes   History of prostate cancer    urologist--- dr Tristan Hamilton;  per dr Tristan Hamilton note dx 2011 w/ Gleason 6 in [place] , [place];  active survillance  (09-16-2022  per pt last bx done did not show any cancer)   Hyperlipidemia, mixed    Myalgia, multiple sites    09-16-2022 per pt chronic multiple site ,  intermittant since taking statin medication's,  takes prednisone daily   Osteoarthritis of both knees    Pre-diabetes    07/29/22 Hgb A1c 5.9   Sinus bradycardia seen on cardiac monitor 02/24/2020   lowest heart rate 35 w/ occasional PVCs / PACs   Urinary retention 10/01/2022   Patient has been daily self catherizing since 10/01/22, as of 01/02/23.   Wears dentures    upper   Wears glasses    mainly for reading   Past Surgical History:  Procedure Laterality Date   BLADDER STONE REMOVAL     CYSTOSCOPY WITH INSERTION OF UROLIFT  2012   CYSTOSCOPY WITH LITHOLAPAXY N/A 09/28/2022   Procedure: CYSTOSCOPY WITH LITHOLAPAXY/ BILATERAL RETROGRADE PYELOGRAM;  Surgeon: Tristan Paci, MD;  Location: [facility];  Service: Urology;  Laterality: N/A;   KNEE ARTHROSCOPY Right 1975   ORCHIECTOMY Left    child   TONSILLECTOMY     child   TOTAL KNEE ARTHROPLASTY Bilateral 03/24/2020   Procedure: TOTAL KNEE BILATERAL;  Surgeon: Tristan Hitch, MD;  Location: [facility] OR;  Service: Orthopedics;  Laterality: Bilateral;   TRANSURETHRAL RESECTION OF PROSTATE N/A 01/04/2023   Procedure: TRANSURETHRAL RESECTION OF THE PROSTATE (TURP);  Surgeon: Tristan Paci, MD;  Location: [facility];  Service: Urology;  Laterality: N/A;   Family History  Problem Relation Age of Onset   Diverticulitis Mother    Penile cancer Father    Stroke Sister 42       covid,  then massive stroke   Prostate cancer Maternal Grandfather    Colon cancer Neg Hx    Colon polyps Neg Hx    Esophageal cancer Neg Hx    Rectal cancer Neg Hx    Stomach cancer Neg Hx    Social History   Socioeconomic History   Marital status: Married    Spouse name: Not on file   Number of children: Not on file   Years of education: Not on file   Highest education level: Some college, no degree  Occupational History   Not on file  Tobacco Use   Smoking status: Never   Smokeless tobacco: Never  Vaping Use   Vaping status: Never Used  Substance and Sexual Activity   Alcohol use:  Not Currently    Comment: wine very rarely   Drug use: Never   Sexual activity: Not on file    Comment: vasectomy  Other Topics Concern   Not on file  Social History Narrative   Not on file   Social Determinants of Health   Financial Resource Strain: Low Risk  (07/24/2023)   Overall Financial Resource Strain (CARDIA)    Difficulty of Paying Living Expenses: Not hard at all  Food Insecurity: No Food Insecurity (07/24/2023)   Hunger Vital Sign    Worried About Running Out of Food in the Last Year: Never true    Ran Out of Food in the Last Year: Never true  Transportation Needs: No Transportation Needs (07/24/2023)   PRAPARE - Administrator, Civil Service (Medical): No    Lack of Transportation (Non-Medical): No  Physical Activity: Inactive (07/24/2023)   Exercise Vital Sign    Days of Exercise per Week: 0 days    Minutes of Exercise per Session: 0 min  Stress: No Stress Concern Present (07/24/2023)   [employer] of Occupational Health - Occupational Stress Questionnaire    Feeling of Stress : Not at all  Social Connections: Socially Integrated (07/24/2023)   Social Connection and Isolation Panel [NHANES]    Frequency of Communication with Friends and Family: More than three times a week    Frequency of Social Gatherings with Friends and Family: More than three times a week    Attends  Religious Services: More than 4 times per year    Active Member of [employer] or Organizations: Yes    Attends Engineer, structural: More than 4 times per year    Marital Status: Married    Tobacco Counseling Counseling given: Not Answered   Clinical Intake:  Pre-visit preparation completed: Yes  Pain : No/denies pain     BMI - recorded: 29.41 Nutritional Status: BMI 25 -29 Overweight Nutritional Risks: None Diabetes: No  How often do you need to have someone help you when you read instructions, pamphlets, or other written materials from your doctor or pharmacy?: 1 - Never  Interpreter Needed?: No  Information entered by :: Theresa Mulligan LPN   Activities of Daily Living    07/24/2023    1:43 PM 07/23/2023    6:23 PM  In your present state of health, do you have any difficulty performing the following activities:  Hearing? 0 0  Vision? 0 0  Difficulty concentrating or making decisions? 0 0  Walking or climbing stairs? 0 0  Dressing or bathing? 0 0  Doing errands, shopping? 0 0  Preparing Food and eating ? N N  Using the Toilet? N N  In the past six months, have you accidently leaked urine? N N  Do you have problems with loss of bowel control? N N  Managing your Medications? N N  Managing your Finances? N N  Housekeeping or managing your Housekeeping? N N    Patient Care Team: Karie Georges, MD as PCP - General (Family Medicine) Little Ishikawa, MD as PCP - Cardiology (Cardiology)  Indicate any recent Medical Services you may have received from other than [facility] providers in the past year (date may be approximate).     Assessment:   This is a routine wellness examination for [place].  Hearing/Vision screen Hearing Screening - Comments:: Denies hearing difficulties   Vision Screening - Comments:: Wears rx glasses - up to date with routine eye exams with  Dr  Groat  Dietary issues and exercise activities discussed:     Goals Addressed                This Visit's Progress     Patient Stated (pt-stated)        I want to lose a little weight.       Depression Screen    07/24/2023    1:43 PM 12/22/2022    9:32 AM 07/29/2022    1:38 PM 07/18/2022    4:25 PM 05/30/2022    8:43 AM 03/23/2022    2:04 PM 10/27/2021   10:45 AM  PHQ 2/9 Scores  PHQ - 2 Score 0 0 0 0 0 0 0  PHQ- 9 Score  0 0  0 0 6    Fall Risk    07/24/2023    1:44 PM 07/23/2023    6:23 PM 03/23/2023    9:51 AM 07/18/2022    4:23 PM 07/14/2022   11:06 PM  Fall Risk   Falls in the past year? 0 0 0 0 0  Number falls in past yr: 0 0  0 0  Injury with Fall? 0 0  0 0  Risk for fall due to : No Fall Risks   No Fall Risks;Medication side effect   Follow up Falls prevention discussed   Falls evaluation completed;Education provided;Falls prevention discussed     MEDICARE RISK AT HOME:  Medicare Risk at Home - 07/24/23 1349     Any stairs in or around the home? Yes    If so, are there any without handrails? No    Home free of loose throw rugs in walkways, pet beds, electrical cords, etc? Yes    Adequate lighting in your home to reduce risk of falls? Yes    Life alert? No    Use of a cane, walker or w/c? No    Grab bars in the bathroom? Yes    Shower chair or bench in shower? Yes    Elevated toilet seat or a handicapped toilet? Yes             TIMED UP AND GO:  Was the test performed?  No    Cognitive Function:        07/24/2023    1:45 PM 07/18/2022    4:26 PM  6CIT Screen  What Year? 0 points 0 points  What month? 0 points 0 points  What time? 0 points 0 points  Count back from 20 0 points 0 points  Months in reverse 0 points 0 points  Repeat phrase 0 points 0 points  Total Score 0 points 0 points    Immunizations Immunization History  Administered Date(s) Administered   Fluad Quad(high Dose 65+) 02/05/2020, 10/19/2020   PFIZER(Purple Top)SARS-COV-2 Vaccination 08/21/2020, 09/11/2020   Pneumococcal Polysaccharide-23 02/05/2020   Tdap  01/23/2022   Zoster, Live 10/01/2014    TDAP status: Up to date  Flu Vaccine status: Declined, Education has been provided regarding the importance of this vaccine but patient still declined. Advised may receive this vaccine at local pharmacy or Health Dept. Aware to provide a copy of the vaccination record if obtained from local pharmacy or Health Dept. Verbalized acceptance and understanding.  Pneumococcal vaccine status: Due, Education has been provided regarding the importance of this vaccine. Advised may receive this vaccine at local pharmacy or Health Dept. Aware to provide a copy of the vaccination record if obtained from local pharmacy or Health Dept. Verbalized acceptance and  understanding.  Covid-19 vaccine status: Completed vaccines  Qualifies for Shingles Vaccine? Yes   Zostavax completed No   Shingrix Completed?: No.    Education has been provided regarding the importance of this vaccine. Patient has been advised to call insurance company to determine out of pocket expense if they have not yet received this vaccine. Advised may also receive vaccine at local pharmacy or Health Dept. Verbalized acceptance and understanding.  Screening Tests Health Maintenance  Topic Date Due   Zoster Vaccines- Shingrix (1 of 2) 02/16/1970   COVID-19 Vaccine (3 - Pfizer risk series) 10/09/2020   Pneumonia Vaccine 13+ Years old (2 of 2 - PCV) 02/04/2021   INFLUENZA VACCINE  07/20/2023   Medicare Annual Wellness (AWV)  07/23/2024   Colonoscopy  08/12/2031   DTaP/Tdap/Td (2 - Td or Tdap) 01/24/2032   Hepatitis C Screening  Completed   HPV VACCINES  Aged Out    Health Maintenance  Health Maintenance Due  Topic Date Due   Zoster Vaccines- Shingrix (1 of 2) 02/16/1970   COVID-19 Vaccine (3 - Pfizer risk series) 10/09/2020   Pneumonia Vaccine 71+ Years old (2 of 2 - PCV) 02/04/2021   INFLUENZA VACCINE  07/20/2023    Colorectal cancer screening: Type of screening: Colonoscopy. Completed  08/11/21. Repeat every 10 years  Lung Cancer Screening: (Low Dose CT Chest recommended if Age 17-80 years, 20 pack-year currently smoking OR have quit w/in 15years.) does not qualify.    Additional Screening:  Hepatitis C Screening: does qualify; Completed 10/22/20  Vision Screening: Recommended annual ophthalmology exams for early detection of glaucoma and other disorders of the eye. Is the patient up to date with their annual eye exam?  Yes  Who is the provider or what is the name of the office in which the patient attends annual eye exams? Dr Dione Booze If pt is not established with a provider, would they like to be referred to a provider to establish care? No .   Dental Screening: Recommended annual dental exams for proper oral hygiene   Community Resource Referral / Chronic Care Management:  CRR required this visit?  No   CCM required this visit?  No     Plan:     I have personally reviewed and noted the following in the patient's chart:   Medical and social history Use of alcohol, tobacco or illicit drugs  Current medications and supplements including opioid prescriptions. Patient is not currently taking opioid prescriptions. Functional ability and status Nutritional status Physical activity Advanced directives List of other physicians Hospitalizations, surgeries, and ER visits in previous 12 months Vitals Screenings to include cognitive, depression, and falls Referrals and appointments  In addition, I have reviewed and discussed with patient certain preventive protocols, quality metrics, and best practice recommendations. A written personalized care plan for preventive services as well as general preventive health recommendations were provided to patient.     Tillie Rung, LPN   12/19/9145   After Visit Summary: (MyChart) Due to this being a telephonic visit, the after visit summary with patients personalized plan was offered to patient via MyChart   Nurse Notes:  None

## 2023-08-02 ENCOUNTER — Encounter: Payer: Medicare Other | Admitting: Family Medicine

## 2023-08-02 ENCOUNTER — Encounter: Payer: Self-pay | Admitting: Family Medicine

## 2023-08-02 ENCOUNTER — Ambulatory Visit (INDEPENDENT_AMBULATORY_CARE_PROVIDER_SITE_OTHER): Payer: Medicare Other | Admitting: Family Medicine

## 2023-08-02 VITALS — BP 100/60 | HR 44 | Temp 97.9°F | Ht 70.5 in | Wt 202.1 lb

## 2023-08-02 DIAGNOSIS — Z0000 Encounter for general adult medical examination without abnormal findings: Secondary | ICD-10-CM | POA: Diagnosis not present

## 2023-08-02 DIAGNOSIS — E782 Mixed hyperlipidemia: Secondary | ICD-10-CM | POA: Diagnosis not present

## 2023-08-02 DIAGNOSIS — R7303 Prediabetes: Secondary | ICD-10-CM | POA: Diagnosis not present

## 2023-08-02 LAB — COMPREHENSIVE METABOLIC PANEL
ALT: 32 U/L (ref 0–53)
AST: 27 U/L (ref 0–37)
Albumin: 4.4 g/dL (ref 3.5–5.2)
Alkaline Phosphatase: 76 U/L (ref 39–117)
BUN: 16 mg/dL (ref 6–23)
CO2: 30 mEq/L (ref 19–32)
Calcium: 9.7 mg/dL (ref 8.4–10.5)
Chloride: 105 mEq/L (ref 96–112)
Creatinine, Ser: 1.01 mg/dL (ref 0.40–1.50)
GFR: 74.33 mL/min (ref >60.00)
Glucose, Bld: 103 mg/dL — ABNORMAL HIGH (ref 70–99)
Potassium: 4.3 mEq/L (ref 3.5–5.1)
Sodium: 140 mEq/L (ref 135–145)
Total Bilirubin: 0.7 mg/dL (ref 0.2–1.2)
Total Protein: 6.6 g/dL (ref 6.0–8.3)

## 2023-08-02 LAB — LIPID PANEL
Cholesterol: 129 mg/dL (ref 0–200)
HDL: 51.8 mg/dL (ref >39.00)
LDL Cholesterol: 61 mg/dL (ref 0–99)
NonHDL: 77.58
Total CHOL/HDL Ratio: 2
Triglycerides: 83 mg/dL (ref 0.0–149.0)
VLDL: 16.6 mg/dL (ref 0.0–40.0)

## 2023-08-02 LAB — HEMOGLOBIN A1C: Hgb A1c MFr Bld: 5.6 % (ref 4.6–6.5)

## 2023-08-02 NOTE — Progress Notes (Signed)
Complete physical exam  Patient: Tristan Hamilton   DOB: 1951-10-12   72 y.o. Male  MRN: [id number]  Subjective:    Chief Complaint  Patient presents with   Annual Exam    Aveyon Nurse is a 72 y.o. male who presents today for a complete physical exam. He reports consuming a general diet. Home exercise routine includes yard work and stays active around the house. He generally feels well. He reports sleeping well. He does not have additional problems to discuss today.    Most recent fall risk assessment:    08/02/2023    8:23 AM  Fall Risk   Falls in the past year? 0  Number falls in past yr: 0  Injury with Fall? 0  Risk for fall due to : No Fall Risks  Follow up Falls evaluation completed     Most recent depression screenings:    08/02/2023    8:23 AM 07/24/2023    1:43 PM  PHQ 2/9 Scores  PHQ - 2 Score 0 0  PHQ- 9 Score 0     Vision:Within last year and Dental: No current dental problems and Receives regular dental care  Patient Active Problem List   Diagnosis Date Noted   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   TFCC (triangular fibrocartilage complex) injury, right, initial encounter 09/22/2022   Prediabetes 07/29/2022   Bilateral carotid artery stenosis 07/06/2022   Ganglion cyst 06/09/2022   Back abscess 06/09/2022   Bilateral wrist pain 02/14/2022   Bilateral shoulder pain 02/14/2022   Myalgia 01/03/2022   Hand cramps 01/03/2022   Statin myopathy 01/03/2022   Hyperlipidemia 11/10/2021   OA (osteoarthritis) of knee 03/24/2020   Status post total bilateral knee replacement 03/24/2020   Acquired renal cyst of right kidney 01/21/2020   Prostate cancer (HCC) 11/01/2019   Enlarged prostate 11/01/2019   Vitamin D deficiency 11/01/2019   Bilateral primary osteoarthritis of knee 09/17/2019      Patient Care Team: Karie Georges, MD as PCP - General (Family Medicine) Little Ishikawa, MD as PCP - Cardiology (Cardiology)    Outpatient Medications Prior to Visit  Medication Sig   albuterol (VENTOLIN HFA) 108 (90 Base) MCG/ACT inhaler Inhale 2 puffs into the lungs every 6 (six) hours as needed for wheezing or shortness of breath.   budesonide-formoterol (SYMBICORT) 160-4.5 MCG/ACT inhaler Inhale 2 puffs into the lungs 2 (two) times daily.   Cholecalciferol 125 MCG (5000 UT) capsule Take 5,000 Units by mouth daily.    cimetidine (CIMETIDINE 200) 200 MG tablet Take 1 tablet (200 mg total) by mouth 2 (two) times daily as needed. Pt isn't sure of the dosage (Patient taking differently: Take 200 mg by mouth as needed. Pt isn't sure of the dosage)   Coenzyme Q10 100 MG TABS Take 1 tablet by mouth daily. Takes 200 mg   DHEA 50 MG TABS Take 1 tablet by mouth daily.   Hyprom-Naphaz-Polysorb-Zn Sulf (CLEAR EYES COMPLETE OP) Apply to eye as needed.   inclisiran (LEQVIO) 284 MG/1.5ML SOSY injection Inject 284 mg into the skin once. Last injection 11/29/22.   thiamine (VITAMIN B-1) 100 MG tablet Take 100 mg by mouth daily.   vitamin B-12 (CYANOCOBALAMIN) 50 MCG tablet Take 50 mcg by mouth daily.   No facility-administered medications prior to visit.    Review of Systems  HENT:  Negative for hearing loss.   Eyes:  Negative for blurred vision.  Respiratory:  Negative for shortness of breath.  Cardiovascular:  Negative for chest pain.  Gastrointestinal: Negative.   Genitourinary: Negative.   Musculoskeletal:  Negative for back pain.  Neurological:  Negative for headaches.  Psychiatric/Behavioral:  Negative for depression.   All other systems reviewed and are negative.      Objective:     BP 100/60 (BP Location: Left Arm, Patient Position: Sitting, Cuff Size: Large)   Pulse (!) 44   Temp 97.9 F (36.6 C) (Oral)   Ht 5' 10.5" (1.791 m)   Wt 202 lb 1.6 oz (91.7 kg)   SpO2 99%   BMI 28.59 kg/m     Physical Exam Vitals reviewed.  Constitutional:      Appearance: Normal appearance. He is well-groomed and  normal weight.  HENT:     Right Ear: Tympanic membrane and ear canal normal.     Left Ear: Tympanic membrane and ear canal normal.     Mouth/Throat:     Mouth: Mucous membranes are moist.     Pharynx: No posterior oropharyngeal erythema.  Eyes:     Extraocular Movements: Extraocular movements intact.     Conjunctiva/sclera: Conjunctivae normal.  Neck:     Thyroid: No thyromegaly.  Cardiovascular:     Rate and Rhythm: Normal rate and regular rhythm.     Heart sounds: S1 normal and S2 normal. No murmur heard. Pulmonary:     Effort: Pulmonary effort is normal.     Breath sounds: Normal breath sounds and air entry. No rales.  Abdominal:     General: Abdomen is flat. Bowel sounds are normal.  Musculoskeletal:     Right lower leg: No edema.     Left lower leg: No edema.  Lymphadenopathy:     Cervical: No cervical adenopathy.  Neurological:     General: No focal deficit present.     Mental Status: He is alert and oriented to person, place, and time.     Gait: Gait is intact.  Psychiatric:        Mood and Affect: Mood and affect normal.      No results found for any visits on 08/02/23.      Assessment & Plan:    Routine Health Maintenance and Physical Exam  Immunization History  Administered Date(s) Administered   Fluad Quad(high Dose 65+) 02/05/2020, 10/19/2020   Influenza,Quad,Nasal, Live 02/05/2020, 10/19/2020   PFIZER(Purple Top)SARS-COV-2 Vaccination 08/21/2020, 09/11/2020   Pneumococcal Polysaccharide-23 02/05/2020   Tdap 01/23/2022   Zoster, Live 10/01/2014    Health Maintenance  Topic Date Due   Zoster Vaccines- Shingrix (1 of 2) 02/16/1970   Pneumonia Vaccine 10+ Years old (2 of 2 - PCV) 02/04/2021   INFLUENZA VACCINE  07/20/2023   COVID-19 Vaccine (3 - Pfizer risk series) 08/18/2023 (Originally 10/09/2020)   Medicare Annual Wellness (AWV)  07/23/2024   Colonoscopy  08/12/2031   DTaP/Tdap/Td (2 - Td or Tdap) 01/24/2032   Hepatitis C Screening  Completed    HPV VACCINES  Aged Out    Discussed health benefits of physical activity, and encouraged him to engage in regular exercise appropriate for his age and condition.  Mixed hyperlipidemia -     Lipid panel; Future -     Comprehensive metabolic panel; Future  Prediabetes -     Hemoglobin A1c; Future  Routine general medical examination at a health care facility  Normal physical exam findings today, counseled patient on upcoming recommended vaccinations. Handouts given on healthy eating and exercise. Labs ordered for surveillance.   Return in about 1  year (around 08/01/2024) for annual physical exam.     Karie Georges, MD

## 2023-08-02 NOTE — Patient Instructions (Addendum)
RSV vaccination, FLU and updated COVID boosters. You can get these done at any pharmacy at your convenience. Health Maintenance, Male Adopting a healthy lifestyle and getting preventive care are important in promoting health and wellness. Ask your health care provider about: The right schedule for you to have regular tests and exams. Things you can do on your own to prevent diseases and keep yourself healthy. What should I know about diet, weight, and exercise? Eat a healthy diet  Eat a diet that includes plenty of vegetables, fruits, low-fat dairy products, and lean protein. Do not eat a lot of foods that are high in solid fats, added sugars, or sodium. Maintain a healthy weight Body mass index (BMI) is a measurement that can be used to identify possible weight problems. It estimates body fat based on height and weight. Your health care provider can help determine your BMI and help you achieve or maintain a healthy weight. Get regular exercise Get regular exercise. This is one of the most important things you can do for your health. Most adults should: Exercise for at least 150 minutes each week. The exercise should increase your heart rate and make you sweat (moderate-intensity exercise). Do strengthening exercises at least twice a week. This is in addition to the moderate-intensity exercise. Spend less time sitting. Even light physical activity can be beneficial. Watch cholesterol and blood lipids Have your blood tested for lipids and cholesterol at 72 years of age, then have this test every 5 years. You may need to have your cholesterol levels checked more often if: Your lipid or cholesterol levels are high. You are older than 72 years of age. You are at high risk for heart disease. What should I know about cancer screening? Many types of cancers can be detected early and may often be prevented. Depending on your health history and family history, you may need to have cancer screening at  various ages. This may include screening for: Colorectal cancer. Prostate cancer. Skin cancer. Lung cancer. What should I know about heart disease, diabetes, and high blood pressure? Blood pressure and heart disease High blood pressure causes heart disease and increases the risk of stroke. This is more likely to develop in people who have high blood pressure readings or are overweight. Talk with your health care provider about your target blood pressure readings. Have your blood pressure checked: Every 3-5 years if you are 88-81 years of age. Every year if you are 68 years old or older. If you are between the ages of 29 and 64 and are a current or former smoker, ask your health care provider if you should have a one-time screening for abdominal aortic aneurysm (AAA). Diabetes Have regular diabetes screenings. This checks your fasting blood sugar level. Have the screening done: Once every three years after age 90 if you are at a normal weight and have a low risk for diabetes. More often and at a younger age if you are overweight or have a high risk for diabetes. What should I know about preventing infection? Hepatitis B If you have a higher risk for hepatitis B, you should be screened for this virus. Talk with your health care provider to find out if you are at risk for hepatitis B infection. Hepatitis C Blood testing is recommended for: Everyone born from [birth year] through 1965. Anyone with known risk factors for hepatitis C. Sexually transmitted infections (STIs) You should be screened each year for STIs, including gonorrhea and chlamydia, if: You are sexually active  and are younger than 72 years of age. You are older than 72 years of age and your health care provider tells you that you are at risk for this type of infection. Your sexual activity has changed since you were last screened, and you are at increased risk for chlamydia or gonorrhea. Ask your health care provider if you are at  risk. Ask your health care provider about whether you are at high risk for HIV. Your health care provider may recommend a prescription medicine to help prevent HIV infection. If you choose to take medicine to prevent HIV, you should first get tested for HIV. You should then be tested every 3 months for as long as you are taking the medicine. Follow these instructions at home: Alcohol use Do not drink alcohol if your health care provider tells you not to drink. If you drink alcohol: Limit how much you have to 0-2 drinks a day. Know how much alcohol is in your drink. In the [place] one drink equals one 12 oz bottle of beer (355 mL), one 5 oz glass of wine (148 mL), or one 1 oz glass of hard liquor (44 mL). Lifestyle Do not use any products that contain nicotine or tobacco. These products include cigarettes, chewing tobacco, and vaping devices, such as e-cigarettes. If you need help quitting, ask your health care provider. Do not use street drugs. Do not share needles. Ask your health care provider for help if you need support or information about quitting drugs. General instructions Schedule regular health, dental, and eye exams. Stay current with your vaccines. Tell your health care provider if: You often feel depressed. You have ever been abused or do not feel safe at home. Summary Adopting a healthy lifestyle and getting preventive care are important in promoting health and wellness. Follow your health care provider's instructions about healthy diet, exercising, and getting tested or screened for diseases. Follow your health care provider's instructions on monitoring your cholesterol and blood pressure. This information is not intended to replace advice given to you by your health care provider. Make sure you discuss any questions you have with your health care provider. Document Revised: 04/26/2021 Document Reviewed: 04/26/2021 Elsevier Patient Education  2024 [employer].

## 2023-08-07 ENCOUNTER — Telehealth: Payer: Self-pay | Admitting: Pharmacy Technician

## 2023-08-07 NOTE — Telephone Encounter (Signed)
Auth Submission: APPROVED Site of care: Site of care: [facility] Payer: [facility] MEDICARE Medication & CPT/J Code(s) submitted: Leqvio (Inclisiran) [phone] Route of submission (phone, fax, portal): PORTAL Phone # Fax # Auth type: Buy/Bill PB Units/visits requested: 284MG  [id number] Reference number: [id number] Approval from: 08/07/23 to 08/05/24

## 2023-08-29 ENCOUNTER — Ambulatory Visit: Payer: Medicare Other

## 2023-09-04 ENCOUNTER — Ambulatory Visit (INDEPENDENT_AMBULATORY_CARE_PROVIDER_SITE_OTHER): Payer: Medicare Other

## 2023-09-04 VITALS — BP 144/73 | HR 49 | Temp 97.6°F | Resp 14 | Ht 70.0 in | Wt 200.8 lb

## 2023-09-04 DIAGNOSIS — I6523 Occlusion and stenosis of bilateral carotid arteries: Secondary | ICD-10-CM

## 2023-09-04 DIAGNOSIS — E782 Mixed hyperlipidemia: Secondary | ICD-10-CM | POA: Diagnosis not present

## 2023-09-04 MED ORDER — INCLISIRAN SODIUM 284 MG/1.5ML ~~LOC~~ SOSY
284.0000 mg | PREFILLED_SYRINGE | Freq: Once | SUBCUTANEOUS | Status: AC
  Administered 2023-09-04: 284 mg via SUBCUTANEOUS
  Filled 2023-09-04: qty 1.5

## 2023-09-04 NOTE — Progress Notes (Signed)
Diagnosis: Hyperlipidemia  Provider:  Chilton Greathouse MD  Procedure: Injection  Leqvio (inclisiran), Dose: 284 mg, Site: subcutaneous, Number of injections: 1  Post Care:     Discharge: Condition: Good, Destination: Home . AVS Declined  Performed by:  Garnette Czech, RN

## 2023-10-30 ENCOUNTER — Encounter: Payer: Self-pay | Admitting: Family Medicine

## 2023-10-31 ENCOUNTER — Encounter: Payer: Self-pay | Admitting: Family Medicine

## 2023-10-31 ENCOUNTER — Ambulatory Visit (INDEPENDENT_AMBULATORY_CARE_PROVIDER_SITE_OTHER): Payer: Medicare Other | Admitting: Family Medicine

## 2023-10-31 VITALS — BP 110/70 | HR 46 | Temp 98.1°F | Wt 200.0 lb

## 2023-10-31 DIAGNOSIS — M67431 Ganglion, right wrist: Secondary | ICD-10-CM

## 2023-10-31 DIAGNOSIS — Z23 Encounter for immunization: Secondary | ICD-10-CM

## 2023-10-31 NOTE — Addendum Note (Signed)
Addended by: Carola Rhine on: 10/31/2023 11:34 AM   Modules accepted: Orders

## 2023-10-31 NOTE — Progress Notes (Signed)
   Subjective:    Patient ID: Tristan Hamilton, male    DOB: 09-11-1951, 72 y.o.   MRN: [id number]  HPI Here to check a nodule that appeared on the right wrist about 4 years ago. He had this aspirated a few years ago, but it grew back. It is mildly painful.    Review of Systems  Constitutional: Negative.   Respiratory: Negative.    Cardiovascular: Negative.   Musculoskeletal:  Positive for arthralgias.       Objective:   Physical Exam Constitutional:      Appearance: Normal appearance.  Cardiovascular:     Rate and Rhythm: Normal rate and regular rhythm.     Pulses: Normal pulses.     Heart sounds: Normal heart sounds.  Pulmonary:     Effort: Pulmonary effort is normal.     Breath sounds: Normal breath sounds.  Musculoskeletal:     Comments: The dorsal right wrist has a small firm mobile non-tender mass   Neurological:     Mental Status: He is alert.           Assessment & Plan:  Ganglion cyst. We will refer to Hand Surgery to discuss a possible removal.  Gershon Crane, MD

## 2024-01-01 ENCOUNTER — Encounter: Payer: Self-pay | Admitting: Cardiology

## 2024-01-02 ENCOUNTER — Encounter: Payer: Self-pay | Admitting: Cardiology

## 2024-01-03 ENCOUNTER — Ambulatory Visit (INDEPENDENT_AMBULATORY_CARE_PROVIDER_SITE_OTHER): Payer: Medicare Other | Admitting: Family Medicine

## 2024-01-03 ENCOUNTER — Encounter: Payer: Self-pay | Admitting: Family Medicine

## 2024-01-03 VITALS — BP 124/72 | HR 65 | Temp 98.2°F | Wt 202.0 lb

## 2024-01-03 DIAGNOSIS — J40 Bronchitis, not specified as acute or chronic: Secondary | ICD-10-CM

## 2024-01-03 MED ORDER — DOXYCYCLINE HYCLATE 100 MG PO TABS: 100.0000 mg | ORAL_TABLET | Freq: Two times a day (BID) | ORAL | 0 refills | Status: DC

## 2024-01-03 MED ORDER — HYDROCODONE BIT-HOMATROP MBR 5-1.5 MG/5ML PO SOLN: 5.0000 mL | ORAL | 0 refills | Status: DC | PRN

## 2024-01-03 NOTE — Progress Notes (Signed)
   Subjective:    Patient ID: Padraig Chidester, male    DOB: 1951/10/24, 73 y.o.   MRN: [id number]  HPI Here for a cough that started 3 weeks ago. No fever or SOB. At first the sputum he brought up was brown, now it is clear. He went to an urgent care on 12-24-23, and he was treated with 7 days of Cefdinir. This did not have much of an effect.    Review of Systems  Constitutional: Negative.   HENT: Negative.    Eyes: Negative.   Respiratory:  Positive for cough. Negative for shortness of breath and wheezing.        Objective:   Physical Exam Constitutional:      Appearance: Normal appearance. He is not ill-appearing.  HENT:     Right Ear: Tympanic membrane, ear canal and external ear normal.     Left Ear: Tympanic membrane, ear canal and external ear normal.     Nose: Nose normal.     Mouth/Throat:     Pharynx: Oropharynx is clear.  Eyes:     Conjunctiva/sclera: Conjunctivae normal.  Pulmonary:     Effort: Pulmonary effort is normal.     Breath sounds: Normal breath sounds.  Lymphadenopathy:     Cervical: No cervical adenopathy.  Neurological:     Mental Status: He is alert.           Assessment & Plan:  Bronchitis, possibly atypical. Treat with 10 days of Doxycycline .  Corita Diego, MD

## 2024-01-17 ENCOUNTER — Telehealth: Payer: Self-pay

## 2024-01-17 NOTE — Telephone Encounter (Signed)
Auth Submission: APPROVED Site of care: Site of care: [facility] Payer: [employer] medicare Medication & CPT/J Code(s) submitted: Leqvio (Inclisiran) [id number] Route of submission (phone, fax, portal): fax Phone # [phone]  Fax # [phone]  Auth type: Buy/Bill PB Units/visits requested: 284mg  x 4 doses Reference number: [id number] Approval from: 01/04/24 to 01/03/26

## 2024-03-04 ENCOUNTER — Ambulatory Visit (INDEPENDENT_AMBULATORY_CARE_PROVIDER_SITE_OTHER): Payer: Medicare Other

## 2024-03-04 VITALS — BP 126/69 | HR 52 | Temp 97.7°F | Resp 16 | Ht 70.0 in | Wt 205.6 lb

## 2024-03-04 DIAGNOSIS — I6523 Occlusion and stenosis of bilateral carotid arteries: Secondary | ICD-10-CM | POA: Diagnosis not present

## 2024-03-04 DIAGNOSIS — E782 Mixed hyperlipidemia: Secondary | ICD-10-CM | POA: Diagnosis not present

## 2024-03-04 MED ORDER — INCLISIRAN SODIUM 284 MG/1.5ML ~~LOC~~ SOSY
284.0000 mg | PREFILLED_SYRINGE | Freq: Once | SUBCUTANEOUS | Status: AC
  Administered 2024-03-04: 284 mg via SUBCUTANEOUS
  Filled 2024-03-04: qty 1.5

## 2024-03-04 NOTE — Progress Notes (Signed)
 Diagnosis: Hyperlipidemia  Provider:  Chilton Greathouse MD  Procedure: Injection  Leqvio (inclisiran), Dose: 284 mg, Site: subcutaneous, Number of injections: 1  Injection Site(s): Right arm  Post Care: Patient declined observation  Discharge: Condition: Stable, Destination: Home . AVS Declined  Performed by:  Wyvonne Lenz, RN

## 2024-03-27 ENCOUNTER — Ambulatory Visit: Payer: Medicare Other | Attending: Cardiology | Admitting: Cardiology

## 2024-03-27 ENCOUNTER — Encounter: Payer: Self-pay | Admitting: Cardiology

## 2024-03-27 VITALS — BP 118/82 | HR 51 | Ht 70.0 in | Wt 204.2 lb

## 2024-03-27 DIAGNOSIS — E785 Hyperlipidemia, unspecified: Secondary | ICD-10-CM

## 2024-03-27 DIAGNOSIS — R001 Bradycardia, unspecified: Secondary | ICD-10-CM | POA: Diagnosis not present

## 2024-03-27 DIAGNOSIS — I2510 Atherosclerotic heart disease of native coronary artery without angina pectoris: Secondary | ICD-10-CM

## 2024-03-27 NOTE — Progress Notes (Signed)
 Cardiology Office Note:    Date:  03/27/2024   ID:  Tristan Hamilton, DOB 1951-11-17, MRN [id number]  PCP:  Tristan Georges, MD  Cardiologist:  Tristan Ishikawa, MD  Electrophysiologist:  None   Referring MD: Tristan Georges, MD   Chief Complaint  Patient presents with   Bradycardia    History of Present Illness:    Tristan Hamilton is a 73 y.o. male with a hx of prostate cancer, DVT who presents for follow-up.  He was referred by Tristan. Hassan Hamilton for evaluation of bradycardia, initially seen on 03/11/2020.  He was seen by Tristan. Hassan Hamilton for preop evaluation prior to bilateral total knee replacements.  He reported palpitations and Zio patch x2 days was ordered.  Zio patch showed sinus bradycardia down to heart rate 35 bpm.  Patient reports that he is very active.  Recently moved to [place] and has been doing multiple projects on his house and his daughter's house.  Has been cutting down trees and carrying wood.  He denies any exertional chest pain or dyspnea.  He can walk up 2 flights of stairs without stopping.  Reports that the symptoms he felt when he wore his monitor were that his heart was pounding.  He denies any lightheadedness, syncope or presyncope, chest pain, or dyspnea.  He has a history of an incidental DVT that was found in his left knee years ago, he states that he took a blood thinner for 6 months or so, and has not had recurrence.  No smoking history.  No history of heart disease in his immediate family.  TTE was done on 03/12/2020, which showed normal biventricular function, no significant valvular disease.  Calcium score on 07/27/2021 was 237 (59th percentile).  Also notable for dilated ascending aorta measuring 40 mm  Since last clinic visit, he reports he is doing well.  Denies any chest pain, dyspnea, lightheadedness, syncope, lower extremity edema, or palpitations.  Has not been exercising.   BP Readings from Last 3 Encounters:  03/27/24 118/82  03/04/24 126/69   01/03/24 124/72      Past Medical History:  Diagnosis Date   Ascending aorta dilation (HCC)    last CTA 07-29-2022  3.7cm   Asymptomatic bilateral carotid artery stenosis    doppler 06-22-2022 bilateral ICA 1-39%   Bladder calculi    BPH (benign prostatic hyperplasia)    Cancer (HCC)    prostate cancer in 2012   Coronary artery calcification seen on CT scan    cardiologist--- Tristan Hamilton;   coronary calcium score CT 07-27-2022 (237)   GERD (gastroesophageal reflux disease)    Heart murmur    no problems   History of DVT of lower extremity 1975   LLE  post op knee arthroscopy right side ;   treated with blood thinner,  per pt no clot before or since 1975   History of kidney stones    History of migraine    h/o of ocular migranes   History of prostate cancer    urologist--- Tristan Hamilton;  per Tristan Hamilton note dx 2011 w/ Gleason 6 in [place] , [place];  active survillance  (09-16-2022  per pt last bx done did not show any cancer)   Hyperlipidemia, mixed    Myalgia, multiple sites    09-16-2022 per pt chronic multiple site ,  intermittant since taking statin medication's,  takes prednisone daily   Osteoarthritis of both knees    Pre-diabetes    07/29/22 Hgb  A1c 5.9   Sinus bradycardia seen on cardiac monitor 02/24/2020   lowest heart rate 35 w/ occasional PVCs / PACs   Urinary retention 10/01/2022   Patient has been daily self catherizing since 10/01/22, as of 01/02/23.   Wears dentures    upper   Wears glasses    mainly for reading    Past Surgical History:  Procedure Laterality Date   BLADDER STONE REMOVAL     CYSTOSCOPY WITH INSERTION OF UROLIFT  2012   CYSTOSCOPY WITH LITHOLAPAXY N/A 09/28/2022   Procedure: CYSTOSCOPY WITH LITHOLAPAXY/ BILATERAL RETROGRADE PYELOGRAM;  Surgeon: Rene Paci, MD;  Location: [facility];  Service: Urology;  Laterality: N/A;   KNEE ARTHROSCOPY Right 1975   ORCHIECTOMY Left    child   TONSILLECTOMY     child    TOTAL KNEE ARTHROPLASTY Bilateral 03/24/2020   Procedure: TOTAL KNEE BILATERAL;  Surgeon: Kathryne Hitch, MD;  Location: [facility] OR;  Service: Orthopedics;  Laterality: Bilateral;   TRANSURETHRAL RESECTION OF PROSTATE N/A 01/04/2023   Procedure: TRANSURETHRAL RESECTION OF THE PROSTATE (TURP);  Surgeon: Rene Paci, MD;  Location: [facility];  Service: Urology;  Laterality: N/A;    Current Medications: Current Meds  Medication Sig   albuterol (VENTOLIN HFA) 108 (90 Base) MCG/ACT inhaler Inhale 2 puffs into the lungs every 6 (six) hours as needed for wheezing or shortness of breath.   budesonide-formoterol (SYMBICORT) 160-4.5 MCG/ACT inhaler Inhale 2 puffs into the lungs 2 (two) times daily.   Cholecalciferol 125 MCG (5000 UT) capsule Take 5,000 Units by mouth daily.    cimetidine (CIMETIDINE 200) 200 MG tablet Take 1 tablet (200 mg total) by mouth 2 (two) times daily as needed. Pt isn't sure of the dosage (Patient taking differently: Take 200 mg by mouth as needed. Pt isn't sure of the dosage)   Coenzyme Q10 100 MG TABS Take 1 tablet by mouth daily. Takes 200 mg   Hyprom-Naphaz-Polysorb-Zn Sulf (CLEAR EYES COMPLETE OP) Apply to eye as needed.   inclisiran (LEQVIO) 284 MG/1.5ML SOSY injection Inject 284 mg into the skin once. Last injection 11/29/22.   thiamine (VITAMIN B-1) 100 MG tablet Take 100 mg by mouth daily.   vitamin B-12 (CYANOCOBALAMIN) 50 MCG tablet Take 50 mcg by mouth daily.     Allergies:   Crestor [rosuvastatin], Lipitor [atorvastatin], and Nexletol [bempedoic acid]   Social History   Socioeconomic History   Marital status: Married    Spouse name: Not on file   Number of children: Not on file   Years of education: Not on file   Highest education level: Some college, no degree  Occupational History   Not on file  Tobacco Use   Smoking status: Never   Smokeless tobacco: Never  Vaping Use   Vaping status: Never Used  Substance and  Sexual Activity   Alcohol use: Not Currently    Comment: wine very rarely   Drug use: Never   Sexual activity: Not on file    Comment: vasectomy  Other Topics Concern   Not on file  Social History Narrative   Not on file   Social Drivers of Health   Financial Resource Strain: Low Risk  (01/01/2024)   Overall Financial Resource Strain (CARDIA)    Difficulty of Paying Living Expenses: Not hard at all  Food Insecurity: No Food Insecurity (01/01/2024)   Hunger Vital Sign    Worried About Running Out of Food in the Last Year: Never true  Ran Out of Food in the Last Year: Never true  Transportation Needs: No Transportation Needs (01/01/2024)   PRAPARE - Administrator, Civil Service (Medical): No    Lack of Transportation (Non-Medical): No  Physical Activity: Unknown (01/01/2024)   Exercise Vital Sign    Days of Exercise per Week: 2 days    Minutes of Exercise per Session: Patient declined  Recent Concern: Physical Activity - Insufficiently Active (10/30/2023)   Exercise Vital Sign    Days of Exercise per Week: 2 days    Minutes of Exercise per Session: 60 min  Stress: No Stress Concern Present (01/01/2024)   [employer] of Occupational Health - Occupational Stress Questionnaire    Feeling of Stress : Not at all  Social Connections: Socially Integrated (01/01/2024)   Social Connection and Isolation Panel [NHANES]    Frequency of Communication with Friends and Family: More than three times a week    Frequency of Social Gatherings with Friends and Family: More than three times a week    Attends Religious Services: More than 4 times per year    Active Member of [employer] or Organizations: Yes    Attends Engineer, structural: More than 4 times per year    Marital Status: Married     Family History: The patient's family history includes Diverticulitis in his mother; Penile cancer in his father; Prostate cancer in his maternal grandfather; Stroke (age of onset: 63)  in his sister. There is no history of Colon cancer, Colon polyps, Esophageal cancer, Rectal cancer, or Stomach cancer.  ROS:   Please see the history of present illness.      All other systems reviewed and are negative.  EKGs/Labs/Other Studies Reviewed:    The following studies were reviewed today:   EKG:   03/27/2024: Sinus bradycardia, rate 49 01/24: Sinus rhythm, rate 66 07/22: sinus bradycardia, rate 47, no ST abnomalities 05/21: sinus bradycardia, rate 55, no ST/T nabormalities  TTE 03/12/20:   1. Left ventricular ejection fraction, by estimation, is 60 to 65%. The  left ventricle has normal function. The left ventricle has no regional  wall motion abnormalities. There is mild left ventricular hypertrophy.  Left ventricular diastolic parameters  were normal.   2. Right ventricular systolic function is normal. The right ventricular  size is normal. There is mildly elevated pulmonary artery systolic  pressure. The estimated right ventricular systolic pressure is 35.5 mmHg.   3. The mitral valve is normal in structure. Trivial mitral valve  regurgitation.   4. The aortic valve is tricuspid. Aortic valve regurgitation is trivial.  Mild aortic valve sclerosis is present, with no evidence of aortic valve  stenosis.   5. The inferior vena cava is normal in size with <50% respiratory  variability, suggesting right atrial pressure of 8 mmHg.   Cardiac monitor 02/24/20: Sinus bradycardia, normal sinus rhythm and sinus tachycardia. The average heart rate was 63 bpm and ranged from 35-137 Occasional PVCs, bigeminal PVCs, trigeminal PVCs Occasional PACs and nonsustained atrial tachycardia up to 4 beats. Patient symptomatic during bradycardia with HR as low as 35bpm during awake hours.    Recent Labs: 08/02/2023: ALT 32; BUN 16; Creatinine, Ser 1.01; Potassium 4.3; Sodium 140  Recent Lipid Panel    Component Value Date/Time   CHOL 129 08/02/2023 0902   CHOL 137 02/15/2022 0919    TRIG 83.0 08/02/2023 0902   HDL 51.80 08/02/2023 0902   HDL 45 02/15/2022 0919   CHOLHDL 2 08/02/2023  0902   VLDL 16.6 08/02/2023 0902   LDLCALC 61 08/02/2023 0902   LDLCALC 69 02/15/2022 0919   LDLCALC 108 (H) 10/22/2020 1026    Physical Exam:    VS:  BP 118/82 (BP Location: Left Arm, Patient Position: Sitting, Cuff Size: Normal)   Pulse (!) 51   Ht 5\' 10"  (1.778 m)   Wt 204 lb 3.2 oz (92.6 kg)   SpO2 95%   BMI 29.30 kg/m     Wt Readings from Last 3 Encounters:  03/27/24 204 lb 3.2 oz (92.6 kg)  03/04/24 205 lb 9.6 oz (93.3 kg)  01/03/24 202 lb (91.6 kg)     GEN:  Well nourished, well developed in no acute distress HEENT: Normal NECK: No JVD; No carotid bruits CARDIAC: bradycardic, regular, no murmurs, rubs, gallops RESPIRATORY:  Clear to auscultation without rales, wheezing or rhonchi  ABDOMEN: Soft, non-tender, non-distended MUSCULOSKELETAL:  No edema; No deformity  SKIN: Warm and dry NEUROLOGIC:  Alert and oriented x 3 PSYCHIATRIC:  Normal affect   ASSESSMENT:    1. Bradycardia   2. Coronary artery disease involving native coronary artery of native heart without angina pectoris   3. Hyperlipidemia LDL goal <70     PLAN:    Sinus bradycardia: Heart rate down to 40s while awake on monitor 02/2020.  There were multiple patient triggered events, which primarily corresponded to normal heart rates but there was one patient triggered event with a heart rate 40 bpm, with reported symptoms of fluttering/heart racing.  He denies any lightheadedness, syncope, presyncope, fatigue, chest pain, or dyspnea.  As does not appear symptomatic from bradycardia, no indication for pacemaker at this time.  TTE 03/12/2020 showed no structural heart disease. -Remains asymptomatic, continue to monitor   PVCs: 1.4% of beats on monitor 01/2020.  No structural heart disease on TTE 02/2020  Hyperlipidemia: LDL 108 on 10/22/2020.  10-year ASCVD risk score 13%.  Calcium score on 07/27/2021 was 237  (59th percentile).  Did not tolerate statins due to myalgias.  Started inclisiran.  LDL 61 on 08/02/23  Aortic dilatation: Ascending aorta measuring 40 mm on calcium score 07/27/21.  CTA chest 07/2022 showed normal caliber ascending thoracic aorta measuring up to 37 mm   RTC in 1 year   Medication Adjustments/Labs and Tests Ordered: Current medicines are reviewed at length with the patient today.  Concerns regarding medicines are outlined above.  Orders Placed This Encounter  Procedures   EKG 12-Lead     No orders of the defined types were placed in this encounter.    Patient Instructions  Medication Instructions:  Continue current medications *If you need a refill on your cardiac medications before your next appointment, please call your pharmacy*  Lab Work: none If you have labs (blood work) drawn today and your tests are completely normal, you will receive your results only by: MyChart Message (if you have MyChart) OR A paper copy in the mail If you have any lab test that is abnormal or we need to change your treatment, we will call you to review the results.  Testing/Procedures: none  Follow-Up: At [facility], you and your health needs are our priority.  As part of our continuing mission to provide you with exceptional heart care, our providers are all part of one team.  This team includes your primary Cardiologist (physician) and Advanced Practice Providers or APPs (Physician Assistants and Nurse Practitioners) who all work together to provide you with the care you need, when  you need it.  Your next appointment:   1 year(s)  Provider:   Little Ishikawa, MD     We recommend signing up for the patient portal called "MyChart".  Sign up information is provided on this After Visit Summary.  MyChart is used to connect with patients for Virtual Visits (Telemedicine).  Patients are able to view lab/test results, encounter notes, upcoming appointments, etc.   Non-urgent messages can be sent to your provider as well.   To learn more about what you can do with MyChart, go to [email or web address].   Other Instructions none      1st Floor: - Lobby - Registration  - Pharmacy  - Lab - Cafe  2nd Floor: - PV Lab - Diagnostic Testing (echo, CT, nuclear med)  3rd Floor: - Vacant  4th Floor: - TCTS (cardiothoracic surgery) - AFib Clinic - Structural Heart Clinic - Vascular Surgery  - Vascular Ultrasound  5th Floor: - HeartCare Cardiology (general and EP) - Clinical Pharmacy for coumadin, hypertension, lipid, weight-loss medications, and med management appointments    Valet parking services will be available as well.        Signed, Tristan Ishikawa, MD  03/27/2024 9:23 AM    [facility]

## 2024-03-27 NOTE — Patient Instructions (Signed)
 Medication Instructions:  Continue current medications *If you need a refill on your cardiac medications before your next appointment, please call your pharmacy*  Lab Work: none If you have labs (blood work) drawn today and your tests are completely normal, you will receive your results only by: MyChart Message (if you have MyChart) OR A paper copy in the mail If you have any lab test that is abnormal or we need to change your treatment, we will call you to review the results.  Testing/Procedures: none  Follow-Up: At [facility], you and your health needs are our priority.  As part of our continuing mission to provide you with exceptional heart care, our providers are all part of one team.  This team includes your primary Cardiologist (physician) and Advanced Practice Providers or APPs (Physician Assistants and Nurse Practitioners) who all work together to provide you with the care you need, when you need it.  Your next appointment:   1 year(s)  Provider:   Little Ishikawa, MD     We recommend signing up for the patient portal called "MyChart".  Sign up information is provided on this After Visit Summary.  MyChart is used to connect with patients for Virtual Visits (Telemedicine).  Patients are able to view lab/test results, encounter notes, upcoming appointments, etc.  Non-urgent messages can be sent to your provider as well.   To learn more about what you can do with MyChart, go to [email or web address].   Other Instructions none      1st Floor: - Lobby - Registration  - Pharmacy  - Lab - Cafe  2nd Floor: - PV Lab - Diagnostic Testing (echo, CT, nuclear med)  3rd Floor: - Vacant  4th Floor: - TCTS (cardiothoracic surgery) - AFib Clinic - Structural Heart Clinic - Vascular Surgery  - Vascular Ultrasound  5th Floor: - HeartCare Cardiology (general and EP) - Clinical Pharmacy for coumadin, hypertension, lipid, weight-loss medications, and med  management appointments    Valet parking services will be available as well.

## 2024-06-02 ENCOUNTER — Other Ambulatory Visit: Payer: Self-pay

## 2024-06-02 ENCOUNTER — Encounter (HOSPITAL_COMMUNITY): Payer: Self-pay

## 2024-06-02 ENCOUNTER — Emergency Department (HOSPITAL_COMMUNITY)
Admission: EM | Admit: 2024-06-02 | Discharge: 2024-06-02 | Disposition: A | Discharge status: Home / Self Care | Attending: Emergency Medicine | Admitting: Emergency Medicine

## 2024-06-02 DIAGNOSIS — T7840XA Allergy, unspecified, initial encounter: Secondary | ICD-10-CM | POA: Insufficient documentation

## 2024-06-02 MED ORDER — DOXYCYCLINE HYCLATE 100 MG PO CAPS
100.0000 mg | ORAL_CAPSULE | Freq: Two times a day (BID) | ORAL | 0 refills | Status: DC
Start: 2024-06-02 — End: 2024-08-20

## 2024-06-02 MED ORDER — PREDNISONE 20 MG PO TABS: ORAL_TABLET | ORAL | 0 refills | Status: DC

## 2024-06-02 MED ORDER — DOXYCYCLINE HYCLATE 100 MG PO TABS
100.0000 mg | ORAL_TABLET | Freq: Once | ORAL | Status: AC
  Administered 2024-06-02: 100 mg via ORAL
  Filled 2024-06-02: qty 1

## 2024-06-02 MED ORDER — PREDNISONE 20 MG PO TABS
60.0000 mg | ORAL_TABLET | Freq: Once | ORAL | Status: AC
  Administered 2024-06-02: 60 mg via ORAL
  Filled 2024-06-02: qty 3

## 2024-06-02 NOTE — ED Notes (Signed)
 Discharge paperwork gone over with patient. Educated on prescribed medication to be taken. Denies any needs or questions at this time.

## 2024-06-02 NOTE — ED Triage Notes (Signed)
 Pt arrived from home via POV c/o allergic reaction. Pt thinks it may be to medication that the dermatologist started approx 2 weeks ago. Pt states that the rash he has started on Thursday and is continuously getting worse. Pt states that this evening he has had changes in his voice and is afraid the allergic reaction is now spreading to his throat.

## 2024-06-02 NOTE — ED Provider Notes (Signed)
 [place] EMERGENCY DEPARTMENT AT [facility] Provider Note   CSN: [id number] Arrival date & time: 06/02/24  2007     Patient presents with: Allergic Reaction   Tristan Hamilton is a 73 y.o. male.   73 yo M with a chief complaints of a rash.  The patient has seen his dermatologist for this.  Started with a localized spot to his right flank and then he feels like it spread.  He struggled with acne all his life and was started on Accutane.  He said that when he took the Accutane a couple days ago he realized that he had lower lip swelling and had spreading of the rash.  This seemed to have resolved the next day but when he took the Accutane again he had recurrence.  Since that he stopped taking the Accutane but he had developed a change to his voice he was concerned and so came in for evaluation.  He denies cough congestion or fever.  He did have a tick on him about a week or 2 ago.   Allergic Reaction      Prior to Admission medications   Medication Sig Start Date End Date Taking? Authorizing Provider  doxycycline  (VIBRAMYCIN ) 100 MG capsule Take 1 capsule (100 mg total) by mouth 2 (two) times daily. One po bid x 7 days 06/02/24  Yes Elisavet Buehrer, DO  predniSONE  (DELTASONE ) 20 MG tablet 2 tabs po daily x 4 days 06/02/24  Yes Albertus Hughs, DO  albuterol  (VENTOLIN  HFA) 108 (90 Base) MCG/ACT inhaler Inhale 2 puffs into the lungs every 6 (six) hours as needed for wheezing or shortness of breath. 03/08/23   Aida House, MD  budesonide -formoterol  (SYMBICORT ) 160-4.5 MCG/ACT inhaler Inhale 2 puffs into the lungs 2 (two) times daily. 03/08/23   Aida House, MD  Cholecalciferol  125 MCG (5000 UT) capsule Take 5,000 Units by mouth daily.     [provider]  cimetidine  (CIMETIDINE  200) 200 MG tablet Take 1 tablet (200 mg total) by mouth 2 (two) times daily as needed. Pt isn't sure of the dosage Patient taking differently: Take 200 mg by mouth as needed. Pt isn't sure of the  dosage 04/21/22   Koberlein, Junell C, MD  Coenzyme Q10 100 MG TABS Take 1 tablet by mouth daily. Takes 200 mg    [provider]  Hyprom-Naphaz-Polysorb-Zn Sulf (CLEAR EYES COMPLETE OP) Apply to eye as needed.    [provider]  inclisiran (LEQVIO ) 284 MG/1.5ML SOSY injection Inject 284 mg into the skin once. Last injection 11/29/22.    [provider]  thiamine (VITAMIN B-1) 100 MG tablet Take 100 mg by mouth daily.    [provider]  vitamin B-12 (CYANOCOBALAMIN ) 50 MCG tablet Take 50 mcg by mouth daily.    [provider]    Allergies: Crestor  Dymphna.Dries ], Lipitor [atorvastatin ], and Nexletol  [bempedoic acid ]    Review of Systems  Updated Vital Signs BP (!) 144/73 (BP Location: Right Arm)   Pulse 72   Temp 98.8 F (37.1 C)   Resp 17   Ht 5' 10 (1.778 m)   Wt 90.7 kg   SpO2 96%   BMI 28.70 kg/m   Physical Exam Vitals and nursing note reviewed.  Constitutional:      Appearance: He is well-developed.  HENT:     Head: Normocephalic and atraumatic.   Eyes:     Pupils: Pupils are equal, round, and reactive to light.   Neck:  Vascular: No JVD.   Cardiovascular:     Rate and Rhythm: Normal rate and regular rhythm.     Heart sounds: No murmur heard.    No friction rub. No gallop.  Pulmonary:     Effort: No respiratory distress.     Breath sounds: No wheezing.  Abdominal:     General: There is no distension.     Tenderness: There is no abdominal tenderness. There is no guarding or rebound.   Musculoskeletal:        General: Normal range of motion.     Cervical back: Normal range of motion and neck supple.     Comments: Diffuse erythematous rash mostly along the back.  Palpable.  Blanching.  There is a area on his right flank with a central dark core.  He has a more fine rash to his axilla and along his chest.  Erythematous.  Nonpalpable.   Skin:    Coloration: Skin is not pale.     Findings: No rash.    Neurological:     Mental Status: He is alert and oriented to person, place, and time.   Psychiatric:        Behavior: Behavior normal.     (all labs ordered are listed, but only abnormal results are displayed) Labs Reviewed - No data to display  EKG: None  Radiology: No results found.   Procedures   Medications Ordered in the ED  predniSONE  (DELTASONE ) tablet 60 mg (has no administration in time range)  doxycycline  (VIBRA -TABS) tablet 100 mg (has no administration in time range)                                    Medical Decision Making Risk Prescription drug management.   73 yo M with a chief complaints of rash and lip swelling and no change to his voice.  These have changed over a matter of days.  Seem to coincide when he took Accutane.  He has stopped taking the Accutane but had a change in voice this evening as he was worried that perhaps his allergic reaction had spread to his throat.  He has no obvious signs consistent with anaphylaxis.  I will start him on steroids.  He also tells me that he was recently bit by a tick.  I will start him on doxycycline .  Dermatology follow-up.  9:26 PM:  I have discussed the diagnosis/risks/treatment options with the patient.  Evaluation and diagnostic testing in the emergency department does not suggest an emergent condition requiring admission or immediate intervention beyond what has been performed at this time.  They will follow up with PCP. We also discussed returning to the ED immediately if new or worsening sx occur. We discussed the sx which are most concerning (e.g., sudden worsening pain, fever, inability to tolerate by mouth) that necessitate immediate return. Medications administered to the patient during their visit and any new prescriptions provided to the patient are listed below.  Medications given during this visit Medications  predniSONE  (DELTASONE ) tablet 60 mg (has no administration in time range)  doxycycline   (VIBRA -TABS) tablet 100 mg (has no administration in time range)     The patient appears reasonably screen and/or stabilized for discharge and I doubt any other medical condition or other [facility] requiring further screening, evaluation, or treatment in the ED at this time prior to discharge.       Final diagnoses:  Allergic reaction, initial encounter    ED Discharge Orders          Ordered    predniSONE  (DELTASONE ) 20 MG tablet        06/02/24 2121    doxycycline  (VIBRAMYCIN ) 100 MG capsule  2 times daily        06/02/24 2121               Albertus Hughs, DO 06/02/24 2126

## 2024-06-02 NOTE — Discharge Instructions (Signed)
 Follow up with your family doc in the office.  Please return for sudden worsening difficulty breathing or swallowing or if you develop nausea vomiting or diarrhea or feel you get a pass out.  Please follow-up with your dermatologist in the office.

## 2024-07-22 ENCOUNTER — Ambulatory Visit

## 2024-07-22 VITALS — Ht 70.0 in | Wt 204.0 lb

## 2024-07-22 DIAGNOSIS — Z0000 Encounter for general adult medical examination without abnormal findings: Secondary | ICD-10-CM

## 2024-07-22 NOTE — Progress Notes (Signed)
 Subjective:   Tristan Hamilton is a 73 y.o. who presents for a Medicare Wellness preventive visit.  As a reminder, Annual Wellness Visits don't include a physical exam, and some assessments may be limited, especially if this visit is performed virtually. We may recommend an in-person follow-up visit with your provider if needed.  Visit Complete: Virtual I connected with  Tristan Hamilton on 07/22/24 by a audio enabled telemedicine application and verified that I am speaking with the correct person using two identifiers.  Patient Location: Home  Provider Location: Home Office  I discussed the limitations of evaluation and management by telemedicine. The patient expressed understanding and agreed to proceed.  Vital Signs: Because this visit was a virtual/telehealth visit, some criteria may be missing or patient reported. Any vitals not documented were not able to be obtained and vitals that have been documented are patient reported.    Persons Participating in Visit: Patient.  AWV Questionnaire: Yes: Patient Medicare AWV questionnaire was completed by the patient on 07/21/24; I have confirmed that all information answered by patient is correct and no changes since this date.  Cardiac Risk Factors include: male gender;advanced age (>43men, >59 women)     Objective:    Today's Vitals   07/22/24 0954  Weight: 204 lb (92.5 kg)  Height: 5' 10 (1.778 m)   Body mass index is 29.27 kg/m.     07/22/2024   10:01 AM 06/02/2024    8:48 PM 03/04/2024   10:48 AM 07/24/2023    1:45 PM 01/04/2023    8:32 AM 10/01/2022    5:52 PM 09/28/2022    7:51 AM  Advanced Directives  Does Patient Have a Medical Advance Directive? Yes No No Yes Yes Yes Yes  Type of Estate agent of [place];Living will   Healthcare Power of [place];Living will Healthcare Power of [place];Living will Healthcare Power of [place];Living will Healthcare Power of [place];Living will  Does patient want to  make changes to medical advance directive? No - Patient declined   No - Patient declined No - Patient declined No - Patient declined   Copy of Healthcare Power of Attorney in Chart? Yes - validated most recent copy scanned in chart (See row information)   Yes - validated most recent copy scanned in chart (See row information)  No - copy requested, Physician notified No - copy requested  Would patient like information on creating a medical advance directive?  No - Patient declined No - Patient declined        Current Medications (verified) Outpatient Encounter Medications as of 07/22/2024  Medication Sig   albuterol  (VENTOLIN  HFA) 108 (90 Base) MCG/ACT inhaler Inhale 2 puffs into the lungs every 6 (six) hours as needed for wheezing or shortness of breath.   budesonide -formoterol  (SYMBICORT ) 160-4.5 MCG/ACT inhaler Inhale 2 puffs into the lungs 2 (two) times daily.   Cholecalciferol  125 MCG (5000 UT) capsule Take 5,000 Units by mouth daily.    cimetidine  (CIMETIDINE  200) 200 MG tablet Take 1 tablet (200 mg total) by mouth 2 (two) times daily as needed. Pt isn't sure of the dosage (Patient taking differently: Take 200 mg by mouth as needed. Pt isn't sure of the dosage)   Coenzyme Q10 100 MG TABS Take 1 tablet by mouth daily. Takes 200 mg   doxycycline  (VIBRAMYCIN ) 100 MG capsule Take 1 capsule (100 mg total) by mouth 2 (two) times daily. One po bid x 7 days   Hyprom-Naphaz-Polysorb-Zn Sulf (CLEAR EYES COMPLETE OP) Apply  to eye as needed.   inclisiran (LEQVIO ) 284 MG/1.5ML SOSY injection Inject 284 mg into the skin once. Last injection 11/29/22.   predniSONE  (DELTASONE ) 20 MG tablet 2 tabs po daily x 4 days (Patient not taking: Reported on 07/22/2024)   thiamine (VITAMIN B-1) 100 MG tablet Take 100 mg by mouth daily.   vitamin B-12 (CYANOCOBALAMIN ) 50 MCG tablet Take 50 mcg by mouth daily.   No facility-administered encounter medications on file as of 07/22/2024.    Allergies (verified) Crestor   [rosuvastatin ], Lipitor [atorvastatin ], and Nexletol  [bempedoic acid ]   History: Past Medical History:  Diagnosis Date   Ascending aorta dilation (HCC)    last CTA 07-29-2022  3.7cm   Asymptomatic bilateral carotid artery stenosis    doppler 06-22-2022 bilateral ICA 1-39%   Bladder calculi    BPH (benign prostatic hyperplasia)    Cancer (HCC)    prostate cancer in 2012   Coronary artery calcification seen on CT scan    cardiologist--- dr c. cassandra;   coronary calcium  score CT 07-27-2022 (237)   GERD (gastroesophageal reflux disease)    Heart murmur    no problems   History of DVT of lower extremity 1975   LLE  post op knee arthroscopy right side ;   treated with blood thinner,  per pt no clot before or since 1975   History of kidney stones    History of migraine    h/o of ocular migranes   History of prostate cancer    urologist--- dr winter;  per dr winter note dx 2011 w/ Gleason 6 in [place] , [place];  active survillance  (09-16-2022  per pt last bx done did not show any cancer)   Hyperlipidemia, mixed    Myalgia, multiple sites    09-16-2022 per pt chronic multiple site ,  intermittant since taking statin medication's,  takes prednisone  daily   Osteoarthritis of both knees    Pre-diabetes    07/29/22 Hgb A1c 5.9   Sinus bradycardia seen on cardiac monitor 02/24/2020   lowest heart rate 35 w/ occasional PVCs / PACs   Urinary retention 10/01/2022   Patient has been daily self catherizing since 10/01/22, as of 01/02/23.   Wears dentures    upper   Wears glasses    mainly for reading   Past Surgical History:  Procedure Laterality Date   BLADDER STONE REMOVAL     CYSTOSCOPY WITH INSERTION OF UROLIFT  2012   CYSTOSCOPY WITH LITHOLAPAXY N/A 09/28/2022   Procedure: CYSTOSCOPY WITH LITHOLAPAXY/ BILATERAL RETROGRADE PYELOGRAM;  Surgeon: Devere Lonni Righter, MD;  [facility];  Service: Urology;  Laterality: N/A;   KNEE ARTHROSCOPY Right 1975    ORCHIECTOMY Left    child   TONSILLECTOMY     child   TOTAL KNEE ARTHROPLASTY Bilateral 03/24/2020   Procedure: TOTAL KNEE BILATERAL;  Surgeon: Vernetta Lonni GRADE, MD;  Location: [facility] OR;  Service: Orthopedics;  Laterality: Bilateral;   TRANSURETHRAL RESECTION OF PROSTATE N/A 01/04/2023   Procedure: TRANSURETHRAL RESECTION OF THE PROSTATE (TURP);  Surgeon: Devere Lonni Righter, MD;  Location: [facility];  Service: Urology;  Laterality: N/A;   Family History  Problem Relation Age of Onset   Diverticulitis Mother    Penile cancer Father    Stroke Sister 61       covid, then massive stroke   Prostate cancer Maternal Grandfather    Colon cancer Neg Hx    Colon polyps Neg Hx  Esophageal cancer Neg Hx    Rectal cancer Neg Hx    Stomach cancer Neg Hx    Social History   Socioeconomic History   Marital status: Married    Spouse name: Not on file   Number of children: Not on file   Years of education: Not on file   Highest education level: Some college, no degree  Occupational History   Not on file  Tobacco Use   Smoking status: Never   Smokeless tobacco: Never  Vaping Use   Vaping status: Never Used  Substance and Sexual Activity   Alcohol use: Not Currently    Comment: wine very rarely   Drug use: Never   Sexual activity: Not on file    Comment: vasectomy  Other Topics Concern   Not on file  Social History Narrative   Not on file   Social Drivers of Health   Financial Resource Strain: Low Risk  (07/22/2024)   Overall Financial Resource Strain (CARDIA)    Difficulty of Paying Living Expenses: Not hard at all  Food Insecurity: No Food Insecurity (07/22/2024)   Hunger Vital Sign    Worried About Running Out of Food in the Last Year: Never true    Ran Out of Food in the Last Year: Never true  Transportation Needs: No Transportation Needs (07/22/2024)   PRAPARE - Administrator, Civil Service (Medical): No    Lack of Transportation  (Non-Medical): No  Physical Activity: Unknown (07/22/2024)   Exercise Vital Sign    Days of Exercise per Week: Patient declined    Minutes of Exercise per Session: Not on file  Stress: No Stress Concern Present (07/22/2024)   [employer] of Occupational Health - Occupational Stress Questionnaire    Feeling of Stress: Not at all  Social Connections: Socially Integrated (07/22/2024)   Social Connection and Isolation Panel    Frequency of Communication with Friends and Family: More than three times a week    Frequency of Social Gatherings with Friends and Family: More than three times a week    Attends Religious Services: More than 4 times per year    Active Member of [employer] or Organizations: Yes    Attends Engineer, structural: More than 4 times per year    Marital Status: Married    Tobacco Counseling Counseling given: Not Answered    Clinical Intake:  Pre-visit preparation completed: Yes  Pain : No/denies pain     BMI - recorded: 29.27 Nutritional Status: BMI 25 -29 Overweight Nutritional Risks: None Diabetes: No  Lab Results  Component Value Date   HGBA1C 5.6 08/02/2023   HGBA1C 5.6 02/01/2023   HGBA1C 5.9 (A) 07/29/2022     How often do you need to have someone help you when you read instructions, pamphlets, or other written materials from your doctor or pharmacy?: 1 - Never  Interpreter Needed?: No  Information entered by :: Rojelio Blush LPN   Activities of Daily Living     07/22/2024   10:00 AM 07/21/2024    7:40 AM  In your present state of health, do you have any difficulty performing the following activities:  Hearing? 0 0  Vision? 0 0  Difficulty concentrating or making decisions? 0 0  Walking or climbing stairs? 0 0  Dressing or bathing? 0 0  Doing errands, shopping? 0 0  Preparing Food and eating ? N N  Using the Toilet? N N  In the past six months, have you  accidently leaked urine? N N  Do you have problems with loss of bowel control?  N N  Managing your Medications? N N  Managing your Finances? N N  Housekeeping or managing your Housekeeping? N N    Patient Care Team: Ozell Heron HERO, MD as PCP - General (Family Medicine) Kate Lonni CROME, MD as PCP - Cardiology (Cardiology)  I have updated your Care Teams any recent Medical Services you may have received from other providers in the past year.     Assessment:   This is a routine wellness examination for Tristan Hamilton.  Hearing/Vision screen Hearing Screening - Comments:: Denies hearing difficulties   Vision Screening - Comments:: Wears rx glasses - up to date with routine eye exams with  Dr Octavia   Goals Addressed               This Visit's Progress     Increase physical activity (pt-stated)        Lose weight       Depression Screen     07/22/2024   10:02 AM 03/04/2024   10:48 AM 10/31/2023   11:05 AM 08/02/2023    8:23 AM 07/24/2023    1:43 PM 12/22/2022    9:32 AM 07/29/2022    1:38 PM  PHQ 2/9 Scores  PHQ - 2 Score 0 0 0 0 0 0 0  PHQ- 9 Score   0 0  0 0    Fall Risk     07/22/2024   10:00 AM 07/21/2024    7:40 AM 03/04/2024   10:48 AM 10/31/2023   11:05 AM 08/02/2023    8:23 AM  Fall Risk   Falls in the past year? 0 0 0 0 0  Number falls in past yr: 0 0 0 0 0  Injury with Fall? 0 0  0 0  Risk for fall due to : No Fall Risks  No Fall Risks No Fall Risks No Fall Risks  Follow up Falls evaluation completed  Falls evaluation completed Falls evaluation completed Falls evaluation completed    MEDICARE RISK AT HOME:  Medicare Risk at Home Any stairs in or around the home?: Yes If so, are there any without handrails?: No Home free of loose throw rugs in walkways, pet beds, electrical cords, etc?: No Adequate lighting in your home to reduce risk of falls?: Yes Life alert?: No Use of a cane, walker or w/c?: No Grab bars in the bathroom?: No Shower chair or bench in shower?: No Elevated toilet seat or a handicapped toilet?: Yes  TIMED UP  AND GO:  Was the test performed?  No  Cognitive Function: 6CIT completed        07/22/2024   10:01 AM 07/24/2023    1:45 PM 07/18/2022    4:26 PM  6CIT Screen  What Year? 0 points 0 points 0 points  What month? 0 points 0 points 0 points  What time? 0 points 0 points 0 points  Count back from 20 0 points 0 points 0 points  Months in reverse 0 points 0 points 0 points  Repeat phrase 0 points 0 points 0 points  Total Score 0 points 0 points 0 points    Immunizations Immunization History  Administered Date(s) Administered   Fluad Quad(high Dose 65+) 02/05/2020, 10/19/2020   Fluad Trivalent(High Dose 65+) 10/31/2023   Influenza,Quad,Nasal, Live 02/05/2020, 10/19/2020   PFIZER(Purple Top)SARS-COV-2 Vaccination 08/21/2020, 09/11/2020   Pneumococcal Polysaccharide-23 02/05/2020   Tdap 01/23/2022   Zoster,  Live 10/01/2014    Screening Tests Health Maintenance  Topic Date Due   Zoster Vaccines- Shingrix (1 of 2) 02/16/1970   COVID-19 Vaccine (3 - Pfizer risk series) 10/09/2020   Pneumococcal Vaccine: 50+ Years (2 of 2 - PCV) 02/04/2021   INFLUENZA VACCINE  07/19/2024   Medicare Annual Wellness (AWV)  07/22/2025   Colonoscopy  08/12/2031   DTaP/Tdap/Td (2 - Td or Tdap) 01/24/2032   Hepatitis C Screening  Completed   Hepatitis B Vaccines  Aged Out   HPV VACCINES  Aged Out   Meningococcal B Vaccine  Aged Out    Health Maintenance  Health Maintenance Due  Topic Date Due   Zoster Vaccines- Shingrix (1 of 2) 02/16/1970   COVID-19 Vaccine (3 - Pfizer risk series) 10/09/2020   Pneumococcal Vaccine: 50+ Years (2 of 2 - PCV) 02/04/2021   INFLUENZA VACCINE  07/19/2024   Health Maintenance Items Addressed:   Additional Screening:  Vision Screening: Recommended annual ophthalmology exams for early detection of glaucoma and other disorders of the eye. Would you like a referral to an eye doctor? No    Dental Screening: Recommended annual dental exams for proper oral  hygiene  Community Resource Referral / Chronic Care Management: CRR required this visit?  No   CCM required this visit?  No   Plan:    I have personally reviewed and noted the following in the patient's chart:   Medical and social history Use of alcohol, tobacco or illicit drugs  Current medications and supplements including opioid prescriptions. Patient is not currently taking opioid prescriptions. Functional ability and status Nutritional status Physical activity Advanced directives List of other physicians Hospitalizations, surgeries, and ER visits in previous 12 months Vitals Screenings to include cognitive, depression, and falls Referrals and appointments  In addition, I have reviewed and discussed with patient certain preventive protocols, quality metrics, and best practice recommendations. A written personalized care plan for preventive services as well as general preventive health recommendations were provided to patient.   Rojelio LELON Blush, LPN   12/23/7972   After Visit Summary: (MyChart) Due to this being a telephonic visit, the after visit summary with patients personalized plan was offered to patient via MyChart   Notes: Nothing significant to report at this time.

## 2024-07-22 NOTE — Patient Instructions (Addendum)
 Mr. Gentzler , Thank you for taking time out of your busy schedule to complete your Annual Wellness Visit with me. I enjoyed our conversation and look forward to speaking with you again next year. I, as well as your care team,  appreciate your ongoing commitment to your health goals. Please review the following plan we discussed and let me know if I can assist you in the future. Your Game plan/ To Do List    Referrals: If you haven't heard from the office you've been referred to, please reach out to them at the phone provided.   Follow up Visits: We will see or speak with you next year for your Next Medicare AWV with our clinical staff 07/28/25 @ 10a Have you seen your provider in the last 6 months (3 months if uncontrolled diabetes)?   Clinician Recommendations:  Aim for 30 minutes of exercise or brisk walking, 6-8 glasses of water , and 5 servings of fruits and vegetables each day.       This is a list of the screenings recommended for you:  Health Maintenance  Topic Date Due   Zoster (Shingles) Vaccine (1 of 2) 02/16/1970   COVID-19 Vaccine (3 - Pfizer risk series) 10/09/2020   Pneumococcal Vaccine for age over 34 (2 of 2 - PCV) 02/04/2021   Flu Shot  07/19/2024   Medicare Annual Wellness Visit  07/22/2025   Colon Cancer Screening  08/12/2031   DTaP/Tdap/Td vaccine (2 - Td or Tdap) 01/24/2032   Hepatitis C Screening  Completed   Hepatitis B Vaccine  Aged Out   HPV Vaccine  Aged Out   Meningitis B Vaccine  Aged Out    Advanced directives: (In Chart) A copy of your advanced directives are scanned into your chart should your provider ever need it. Advance Care Planning is important because it:  [x]  Makes sure you receive the medical care that is consistent with your values, goals, and preferences  [x]  It provides guidance to your family and loved ones and reduces their decisional burden about whether or not they are making the right decisions based on your wishes.  Follow the link  provided in your after visit summary or read over the paperwork we have mailed to you to help you started getting your Advance Directives in place. If you need assistance in completing these, please reach out to us  so that we can help you!  See attachments for Preventive Care and Fall Prevention Tips.

## 2024-08-20 ENCOUNTER — Ambulatory Visit: Admitting: Family Medicine

## 2024-08-20 ENCOUNTER — Encounter: Payer: Self-pay | Admitting: Family Medicine

## 2024-08-20 VITALS — BP 130/60 | HR 50 | Temp 97.6°F | Ht 69.0 in | Wt 198.3 lb

## 2024-08-20 DIAGNOSIS — R7303 Prediabetes: Secondary | ICD-10-CM

## 2024-08-20 DIAGNOSIS — E782 Mixed hyperlipidemia: Secondary | ICD-10-CM

## 2024-08-20 DIAGNOSIS — Z0000 Encounter for general adult medical examination without abnormal findings: Secondary | ICD-10-CM

## 2024-08-20 DIAGNOSIS — Z0183 Encounter for blood typing: Secondary | ICD-10-CM

## 2024-08-20 DIAGNOSIS — Z23 Encounter for immunization: Secondary | ICD-10-CM

## 2024-08-20 NOTE — Progress Notes (Unsigned)
 Complete physical exam  Patient: Tristan Hamilton   DOB: [birth year]   73 y.o. Male  MRN: [id number]  Subjective:    Chief Complaint  Patient presents with   Annual Exam    Richad Ramsay is a 73 y.o. male who presents today for a complete physical exam. He reports consuming a general diet. Eats good source of protein, does not eat fruits and veggies daily, eats dairy. Eats fast food 2-3 times per week, doesnot eat ultraprocessed foods. Home exercise routine includes walking 1-2 hrs per week. He generally feels well. He reports sleeping well. He does not have additional problems to discuss today.    Most recent fall risk assessment:    07/22/2024   10:00 AM  Fall Risk   Falls in the past year? 0  Number falls in past yr: 0  Injury with Fall? 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   TFCC (triangular fibrocartilage complex) injury, right, initial encounter 09/22/2022   Prediabetes 07/29/2022   Bilateral carotid artery stenosis 07/06/2022   Ganglion cyst 06/09/2022   Back abscess 06/09/2022   Bilateral wrist pain 02/14/2022   Bilateral shoulder pain 02/14/2022   Myalgia 01/03/2022   Hand cramps 01/03/2022   Statin myopathy 01/03/2022   Hyperlipidemia 11/10/2021   OA (osteoarthritis) of knee 03/24/2020   Status post total bilateral knee replacement 03/24/2020   Acquired renal cyst of right kidney 01/21/2020   Prostate cancer (HCC) 11/01/2019   Enlarged prostate 11/01/2019   Vitamin D  deficiency 11/01/2019   Bilateral primary osteoarthritis of knee 09/17/2019      Patient Care Team: Ozell Heron HERO, MD as PCP - General (Family Medicine) Kate Lonni CROME, MD as PCP - Cardiology (Cardiology)   Outpatient Medications Prior to Visit  Medication Sig   albuterol  (VENTOLIN  HFA) 108 (90 Base) MCG/ACT inhaler Inhale 2 puffs into the lungs every 6 (six) hours as needed for wheezing or shortness of breath.   budesonide -formoterol  (SYMBICORT ) 160-4.5 MCG/ACT inhaler Inhale 2 puffs into the lungs 2 (two) times daily.   Cholecalciferol  125 MCG (5000 UT) capsule Take 5,000 Units by mouth daily.    cimetidine  (CIMETIDINE  200) 200 MG tablet Take 1 tablet (200 mg total) by mouth 2 (two) times daily as needed. Pt isn't sure of the dosage (Patient taking differently: Take 200 mg by mouth as needed. Pt isn't sure of the dosage)   Coenzyme Q10 100 MG TABS Take 1 tablet by mouth daily. Takes 200 mg   Hyprom-Naphaz-Polysorb-Zn Sulf (CLEAR EYES COMPLETE OP) Apply to eye as needed.   inclisiran (LEQVIO ) 284 MG/1.5ML SOSY injection Inject 284 mg into the skin once. Last injection 11/29/22.   vitamin B-12 (CYANOCOBALAMIN ) 50 MCG tablet Take 50 mcg by mouth daily.   [DISCONTINUED] doxycycline  (VIBRAMYCIN ) 100 MG capsule Take 1 capsule (100 mg total) by mouth 2 (two) times daily. One po bid x 7 days   [DISCONTINUED] predniSONE  (DELTASONE ) 20 MG tablet 2 tabs po daily x 4 days (Patient not taking: Reported on 07/22/2024)   [DISCONTINUED] thiamine (VITAMIN  B-1) 100 MG tablet Take 100 mg by mouth daily.   No facility-administered medications prior to visit.    Review of Systems  HENT:  Negative for hearing loss.   Eyes:  Negative for blurred vision.  Respiratory:  Negative for shortness of breath.   Cardiovascular:  Negative for chest pain.  Gastrointestinal: Negative.   Genitourinary: Negative.   Musculoskeletal:  Negative for back pain.  Neurological:  Negative for headaches.  Psychiatric/Behavioral:  Negative for depression.        Objective:     BP 130/60   Pulse (!) 50   Temp 97.6 F (36.4 C)  (Oral)   Ht 5' 9 (1.753 m)   Wt 198 lb 4.8 oz (89.9 kg)   SpO2 98%   BMI 29.28 kg/m  {Vitals History (Optional):23777}  Physical Exam Vitals reviewed.  Constitutional:      Appearance: Normal appearance. He is well-groomed and normal weight.  HENT:     Right Ear: Tympanic membrane and ear canal normal.     Left Ear: Tympanic membrane and ear canal normal.     Mouth/Throat:     Mouth: Mucous membranes are moist.     Pharynx: No posterior oropharyngeal erythema.  Eyes:     Extraocular Movements: Extraocular movements intact.     Conjunctiva/sclera: Conjunctivae normal.  Neck:     Thyroid: No thyromegaly.  Cardiovascular:     Rate and Rhythm: Normal rate and regular rhythm.     Heart sounds: S1 normal and S2 normal. No murmur heard. Pulmonary:     Effort: Pulmonary effort is normal.     Breath sounds: Normal breath sounds and air entry. No rales.  Abdominal:     General: Abdomen is flat. Bowel sounds are normal.  Musculoskeletal:     Right lower leg: No edema.     Left lower leg: No edema.  Lymphadenopathy:     Cervical: No cervical adenopathy.  Neurological:     General: No focal deficit present.     Mental Status: He is alert and oriented to person, place, and time.     Gait: Gait is intact.  Psychiatric:        Mood and Affect: Mood and affect normal.      No results found for any visits on 08/20/24. {Show previous labs (optional):23779}    Assessment & Plan:    Routine Health Maintenance and Physical Exam  Immunization History  Administered Date(s) Administered   Fluad Quad(high Dose 65+) 02/05/2020, 10/19/2020   Fluad Trivalent(High Dose 65+) 10/31/2023   Influenza,Quad,Nasal, Live 02/05/2020, 10/19/2020   PFIZER(Purple Top)SARS-COV-2 Vaccination 08/21/2020, 09/11/2020   Pneumococcal Polysaccharide-23 02/05/2020   Tdap 07/02/2021, 01/23/2022   Zoster, Live 10/01/2014    Health Maintenance  Topic Date Due   Zoster Vaccines- Shingrix (1 of 2)  02/16/1970   COVID-19 Vaccine (3 - Pfizer risk series) 10/09/2020   Pneumococcal Vaccine: 50+ Years (2 of 2 - PCV) 02/04/2021   INFLUENZA VACCINE  03/18/2025 (Originally 07/19/2024)   Medicare Annual Wellness (AWV)  07/22/2025   Colonoscopy  08/12/2031   DTaP/Tdap/Td (3 - Td or Tdap) 01/24/2032   Hepatitis C Screening  Completed   HPV VACCINES  Aged Out   Meningococcal B Vaccine  Aged Out    Discussed health benefits of physical activity, and encouraged him to engage in regular exercise appropriate for his age and condition.  Mixed hyperlipidemia -     Lipid panel; Future  Prediabetes -     Hemoglobin  A1c; Future  Routine general medical examination at a health care facility -     Comprehensive metabolic panel with GFR; Future  Immunization due -     Flu vaccine HIGH DOSE PF(Fluzone Trivalent)  Encounter for blood typing -     Type and screen; Future    Return in about 1 year (around 08/20/2025) for annual physical exam.     Heron CHRISTELLA Sharper, MD

## 2024-08-20 NOTE — Patient Instructions (Addendum)
 Consider shingles vaccines -- can go to any pharmacy in the area.  Health Maintenance, Male Adopting a healthy lifestyle and getting preventive care are important in promoting health and wellness. Ask your health care provider about: The right schedule for you to have regular tests and exams. Things you can do on your own to prevent diseases and keep yourself healthy. What should I know about diet, weight, and exercise? Eat a healthy diet  Eat a diet that includes plenty of vegetables, fruits, low-fat dairy products, and lean protein. Do not eat a lot of foods that are high in solid fats, added sugars, or sodium. Maintain a healthy weight Body mass index (BMI) is a measurement that can be used to identify possible weight problems. It estimates body fat based on height and weight. Your health care provider can help determine your BMI and help you achieve or maintain a healthy weight. Get regular exercise Get regular exercise. This is one of the most important things you can do for your health. Most adults should: Exercise for at least 150 minutes each week. The exercise should increase your heart rate and make you sweat (moderate-intensity exercise). Do strengthening exercises at least twice a week. This is in addition to the moderate-intensity exercise. Spend less time sitting. Even light physical activity can be beneficial. Watch cholesterol and blood lipids Have your blood tested for lipids and cholesterol at 73 years of age, then have this test every 5 years. You may need to have your cholesterol levels checked more often if: Your lipid or cholesterol levels are high. You are older than 73 years of age. You are at high risk for heart disease. What should I know about cancer screening? Many types of cancers can be detected early and may often be prevented. Depending on your health history and family history, you may need to have cancer screening at various ages. This may include screening  for: Colorectal cancer. Prostate cancer. Skin cancer. Lung cancer. What should I know about heart disease, diabetes, and high blood pressure? Blood pressure and heart disease High blood pressure causes heart disease and increases the risk of stroke. This is more likely to develop in people who have high blood pressure readings or are overweight. Talk with your health care provider about your target blood pressure readings. Have your blood pressure checked: Every 3-5 years if you are 31-50 years of age. Every year if you are 38 years old or older. If you are between the ages of 19 and 27 and are a current or former smoker, ask your health care provider if you should have a one-time screening for abdominal aortic aneurysm (AAA). Diabetes Have regular diabetes screenings. This checks your fasting blood sugar level. Have the screening done: Once every three years after age 50 if you are at a normal weight and have a low risk for diabetes. More often and at a younger age if you are overweight or have a high risk for diabetes. What should I know about preventing infection? Hepatitis B If you have a higher risk for hepatitis B, you should be screened for this virus. Talk with your health care provider to find out if you are at risk for hepatitis B infection. Hepatitis C Blood testing is recommended for: Everyone born from [birth year] through 1965. Anyone with known risk factors for hepatitis C. Sexually transmitted infections (STIs) You should be screened each year for STIs, including gonorrhea and chlamydia, if: You are sexually active and are younger than 73  years of age. You are older than 73 years of age and your health care provider tells you that you are at risk for this type of infection. Your sexual activity has changed since you were last screened, and you are at increased risk for chlamydia or gonorrhea. Ask your health care provider if you are at risk. Ask your health care provider about  whether you are at high risk for HIV. Your health care provider may recommend a prescription medicine to help prevent HIV infection. If you choose to take medicine to prevent HIV, you should first get tested for HIV. You should then be tested every 3 months for as long as you are taking the medicine. Follow these instructions at home: Alcohol use Do not drink alcohol if your health care provider tells you not to drink. If you drink alcohol: Limit how much you have to 0-2 drinks a day. Know how much alcohol is in your drink. In the [place] one drink equals one 12 oz bottle of beer (355 mL), one 5 oz glass of wine (148 mL), or one 1 oz glass of hard liquor (44 mL). Lifestyle Do not use any products that contain nicotine or tobacco. These products include cigarettes, chewing tobacco, and vaping devices, such as e-cigarettes. If you need help quitting, ask your health care provider. Do not use street drugs. Do not share needles. Ask your health care provider for help if you need support or information about quitting drugs. General instructions Schedule regular health, dental, and eye exams. Stay current with your vaccines. Tell your health care provider if: You often feel depressed. You have ever been abused or do not feel safe at home. Summary Adopting a healthy lifestyle and getting preventive care are important in promoting health and wellness. Follow your health care provider's instructions about healthy diet, exercising, and getting tested or screened for diseases. Follow your health care provider's instructions on monitoring your cholesterol and blood pressure. This information is not intended to replace advice given to you by your health care provider. Make sure you discuss any questions you have with your health care provider. Document Revised: 04/26/2021 Document Reviewed: 04/26/2021 Elsevier Patient Education  2024 [employer].

## 2024-08-23 ENCOUNTER — Other Ambulatory Visit (INDEPENDENT_AMBULATORY_CARE_PROVIDER_SITE_OTHER)

## 2024-08-23 DIAGNOSIS — Z0000 Encounter for general adult medical examination without abnormal findings: Secondary | ICD-10-CM | POA: Diagnosis not present

## 2024-08-23 DIAGNOSIS — E782 Mixed hyperlipidemia: Secondary | ICD-10-CM | POA: Diagnosis not present

## 2024-08-23 DIAGNOSIS — R7303 Prediabetes: Secondary | ICD-10-CM

## 2024-08-23 LAB — LIPID PANEL
Cholesterol: 125 mg/dL (ref 0–200)
HDL: 59.4 mg/dL (ref >39.00)
LDL Cholesterol: 51 mg/dL (ref 0–99)
NonHDL: 65.67
Total CHOL/HDL Ratio: 2
Triglycerides: 75 mg/dL (ref 0.0–149.0)
VLDL: 15 mg/dL (ref 0.0–40.0)

## 2024-08-23 LAB — COMPREHENSIVE METABOLIC PANEL WITH GFR
ALT: 29 U/L (ref 0–53)
AST: 26 U/L (ref 0–37)
Albumin: 4.4 g/dL (ref 3.5–5.2)
Alkaline Phosphatase: 78 U/L (ref 39–117)
BUN: 20 mg/dL (ref 6–23)
CO2: 30 meq/L (ref 19–32)
Calcium: 9.1 mg/dL (ref 8.4–10.5)
Chloride: 104 meq/L (ref 96–112)
Creatinine, Ser: 1.09 mg/dL (ref 0.40–1.50)
GFR: 67.33 mL/min (ref >60.00)
Glucose, Bld: 96 mg/dL (ref 70–99)
Potassium: 4.2 meq/L (ref 3.5–5.1)
Sodium: 142 meq/L (ref 135–145)
Total Bilirubin: 1.3 mg/dL — ABNORMAL HIGH (ref 0.2–1.2)
Total Protein: 6.3 g/dL (ref 6.0–8.3)

## 2024-08-23 LAB — HEMOGLOBIN A1C: Hgb A1c MFr Bld: 6 % (ref 4.6–6.5)

## 2024-08-30 ENCOUNTER — Ambulatory Visit: Payer: Self-pay | Admitting: Family Medicine

## 2024-09-05 ENCOUNTER — Ambulatory Visit (INDEPENDENT_AMBULATORY_CARE_PROVIDER_SITE_OTHER)

## 2024-09-05 VITALS — BP 125/74 | HR 50 | Temp 97.7°F | Resp 16 | Ht 70.0 in | Wt 199.0 lb

## 2024-09-05 DIAGNOSIS — I6523 Occlusion and stenosis of bilateral carotid arteries: Secondary | ICD-10-CM

## 2024-09-05 DIAGNOSIS — E782 Mixed hyperlipidemia: Secondary | ICD-10-CM

## 2024-09-05 MED ORDER — INCLISIRAN SODIUM 284 MG/1.5ML ~~LOC~~ SOSY
284.0000 mg | PREFILLED_SYRINGE | Freq: Once | SUBCUTANEOUS | Status: AC
  Administered 2024-09-05: 284 mg via SUBCUTANEOUS
  Filled 2024-09-05: qty 1.5

## 2024-09-05 NOTE — Progress Notes (Signed)
 Diagnosis: Hyperlipidemia  Provider:  Chilton Greathouse MD  Procedure: Injection  Leqvio (inclisiran), Dose: 284 mg, Site: subcutaneous, Number of injections: 1  Injection Site(s): Left arm  Post Care: Patient declined observation  Discharge: Condition: Good, Destination: Home . AVS Declined  Performed by:  Loney Hering, LPN

## 2024-10-15 ENCOUNTER — Encounter: Payer: Self-pay | Admitting: Family Medicine

## 2025-01-02 ENCOUNTER — Encounter: Payer: Self-pay | Admitting: Family Medicine

## 2025-01-02 ENCOUNTER — Ambulatory Visit: Admitting: Family Medicine

## 2025-01-02 VITALS — BP 110/50 | HR 56 | Temp 97.9°F | Ht 70.0 in | Wt 201.2 lb

## 2025-01-02 DIAGNOSIS — L509 Urticaria, unspecified: Secondary | ICD-10-CM | POA: Diagnosis not present

## 2025-01-02 NOTE — Progress Notes (Signed)
 "  Established Patient Office Visit  Subjective   Patient ID: Tristan Hamilton, male    DOB: [birth year]  Age: 74 y.o. MRN: [id number]  Chief Complaint  Patient presents with   Referral    Patient complains of hives after being given Rxs for recent Influenza (Tamiflu) and a UTI (Macrobid) he had at the same time, later infection on back by dermatologist, requests referral to allergist to determine cause of recurrent hives    HPI Discussed the use of AI scribe software for clinical note transcription with the patient, who gave verbal consent to proceed.  History of Present Illness   Tristan Hamilton is a 74 year old male who presents with medication reactions and hives.  He developed itchy hives on January 5 after starting Tamiflu for the flu. The hives began in his armpits and spread to his sides and beltline, each episode resolving within about an hour. He also developed lip swelling while eating breakfast that day, leading to urgent care where he was treated with prednisone .  He was started on treatment for a UTI on January 6. After culture results he was switched to Cipro , which he is currently taking without any reaction. There was a 1 to 2 day gap between stopping Macrobid and starting Cipro .  He has had prior medication reactions including hives with Zenatane and lip swelling with Zanaflex, which is documented as an allergy. He had joint pain with statins such as Crestor  and Lipitor, which he identifies as a side effect rather than an allergy. He is receiving Lactide infusions without issues.  His flu illness included 2 to 3 days of fever and chest rumble. He is now improved with only an occasional cough and no chest tightness or shortness of breath. He has no current hives or lip swelling.       Current Outpatient Medications  Medication Instructions   albuterol  (VENTOLIN  HFA) 108 (90 Base) MCG/ACT inhaler 2 puffs, Inhalation, Every 6 hours PRN   budesonide -formoterol   (SYMBICORT ) 160-4.5 MCG/ACT inhaler 2 puffs, Inhalation, 2 times daily   Cholecalciferol  5,000 Units, Daily   cimetidine  (CIMETIDINE  200) 200 mg, Oral, 2 times daily PRN, Pt isn't sure of the dosage   Hyprom-Naphaz-Polysorb-Zn Sulf (CLEAR EYES COMPLETE OP) As needed   inclisiran (LEQVIO ) 284 mg,  Once   predniSONE  (DELTASONE ) 40 mg, Daily   triamcinolone  cream (KENALOG) 0.1 % 1 Application, As needed   vitamin B-12 (CYANOCOBALAMIN ) 50 mcg, Daily    Patient Active Problem List   Diagnosis Date Noted   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   TFCC (triangular fibrocartilage complex) injury, right, initial encounter 09/22/2022   Prediabetes 07/29/2022   Bilateral carotid artery stenosis 07/06/2022   Ganglion cyst 06/09/2022   Back abscess 06/09/2022   Bilateral wrist pain 02/14/2022   Bilateral shoulder pain 02/14/2022   Myalgia 01/03/2022   Hand cramps 01/03/2022   Statin myopathy 01/03/2022   Hyperlipidemia 11/10/2021   OA (osteoarthritis) of knee 03/24/2020   Status post total bilateral knee replacement 03/24/2020   Acquired renal cyst of right kidney 01/21/2020   Prostate cancer (HCC) 11/01/2019   Enlarged prostate 11/01/2019   Vitamin D  deficiency 11/01/2019   Bilateral primary osteoarthritis of knee 09/17/2019     Review of Systems  All other systems reviewed and are negative.     Objective:     BP (!) 110/50   Pulse (!) 56   Temp 97.9 F (36.6 C) (Oral)   Ht 5' 10 (1.778  m)   Wt 201 lb 3.2 oz (91.3 kg)   SpO2 97%   BMI 28.87 kg/m     Physical Exam Vitals reviewed.  Constitutional:      Appearance: Normal appearance. He is well-groomed and normal weight.  Cardiovascular:     Rate and Rhythm: Normal rate and regular rhythm.     Heart sounds: S1 normal and S2 normal. No murmur heard. Pulmonary:     Effort: Pulmonary effort is normal.     Breath sounds: Normal breath sounds and air entry. No rales.  Skin:    Findings: No lesion or  rash.  Neurological:     Mental Status: He is alert and oriented to person, place, and time.     Gait: Gait is intact.  Psychiatric:        Mood and Affect: Affect normal.      No results found for any visits on 01/02/25.     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 Recent episode post-Tamiflu and Macrobid initiation. Differential includes histamine response and IgE-mediated reactions. Previous reactions to Zenatane and Zanaflex. Prednisone  effective in reducing symptoms. - Referred to allergist for comprehensive drug allergy testing. - Advised to avoid antihistamines, steroids, and H2 blockers (e.g., cimetidine , famotidine) for 7 days prior to allergist appointment. - Continue current course of Cipro  for urinary tract infection.  Urinary tract infection Currently on Cipro  for urinary tract infection. No adverse reaction to Cipro  noted. - Continue current course of Cipro  for urinary tract infection.        No follow-ups on file.    Heron CHRISTELLA Sharper, MD "

## 2025-02-03 ENCOUNTER — Ambulatory Visit: Payer: Self-pay | Admitting: Allergy

## 2025-03-05 ENCOUNTER — Ambulatory Visit

## 2025-07-28 ENCOUNTER — Ambulatory Visit
